# Patient Record
Sex: Female | Born: 1945 | Race: Black or African American | Hispanic: No | Marital: Married | Smoking: Never smoker
Health system: Southern US, Community
[De-identification: ages and names within clinical notes are randomized; demographics above are authoritative.]

## PROBLEM LIST (undated history)

## (undated) DIAGNOSIS — C50919 Malignant neoplasm of unspecified site of unspecified female breast: Secondary | ICD-10-CM

## (undated) DIAGNOSIS — H269 Unspecified cataract: Secondary | ICD-10-CM

## (undated) DIAGNOSIS — K219 Gastro-esophageal reflux disease without esophagitis: Secondary | ICD-10-CM

## (undated) DIAGNOSIS — M1990 Unspecified osteoarthritis, unspecified site: Secondary | ICD-10-CM

## (undated) DIAGNOSIS — E039 Hypothyroidism, unspecified: Secondary | ICD-10-CM

## (undated) HISTORY — DX: Unspecified osteoarthritis, unspecified site: M19.90

## (undated) HISTORY — DX: Unspecified cataract: H26.9

## (undated) HISTORY — DX: Malignant neoplasm of unspecified site of unspecified female breast: C50.919

## (undated) HISTORY — DX: Hypothyroidism, unspecified: E03.9

---

## 2000-10-06 HISTORY — PX: ABDOMINAL HYSTERECTOMY: SHX81

## 2001-10-06 DIAGNOSIS — M1990 Unspecified osteoarthritis, unspecified site: Secondary | ICD-10-CM

## 2001-10-06 DIAGNOSIS — K219 Gastro-esophageal reflux disease without esophagitis: Secondary | ICD-10-CM

## 2001-10-06 HISTORY — DX: Gastro-esophageal reflux disease without esophagitis: K21.9

## 2001-10-06 HISTORY — DX: Unspecified osteoarthritis, unspecified site: M19.90

## 2003-02-01 DIAGNOSIS — M069 Rheumatoid arthritis, unspecified: Secondary | ICD-10-CM | POA: Insufficient documentation

## 2006-05-26 ENCOUNTER — Ambulatory Visit: Payer: Self-pay | Admitting: Family Medicine

## 2006-05-29 ENCOUNTER — Ambulatory Visit (HOSPITAL_COMMUNITY): Admission: RE | Admit: 2006-05-29 | Discharge: 2006-05-29 | Payer: Self-pay | Admitting: Family Medicine

## 2006-06-29 ENCOUNTER — Ambulatory Visit: Payer: Self-pay | Admitting: Family Medicine

## 2006-06-29 ENCOUNTER — Other Ambulatory Visit: Admission: RE | Admit: 2006-06-29 | Discharge: 2006-06-29 | Payer: Self-pay | Admitting: Family Medicine

## 2006-07-07 ENCOUNTER — Ambulatory Visit (HOSPITAL_COMMUNITY): Admission: RE | Admit: 2006-07-07 | Discharge: 2006-07-07 | Payer: Self-pay | Admitting: Family Medicine

## 2006-07-31 ENCOUNTER — Encounter (INDEPENDENT_AMBULATORY_CARE_PROVIDER_SITE_OTHER): Payer: Self-pay | Admitting: *Deleted

## 2006-07-31 ENCOUNTER — Ambulatory Visit (HOSPITAL_COMMUNITY): Admission: RE | Admit: 2006-07-31 | Discharge: 2006-07-31 | Payer: Self-pay | Admitting: Gastroenterology

## 2006-07-31 ENCOUNTER — Ambulatory Visit: Payer: Self-pay | Admitting: Gastroenterology

## 2006-11-04 ENCOUNTER — Ambulatory Visit: Payer: Self-pay | Admitting: Family Medicine

## 2006-11-10 ENCOUNTER — Encounter: Payer: Self-pay | Admitting: Family Medicine

## 2007-04-15 ENCOUNTER — Encounter (HOSPITAL_COMMUNITY): Admission: RE | Admit: 2007-04-15 | Discharge: 2007-05-15 | Payer: Self-pay | Admitting: Endocrinology

## 2007-10-07 ENCOUNTER — Encounter: Payer: Self-pay | Admitting: Family Medicine

## 2008-04-14 ENCOUNTER — Encounter: Payer: Self-pay | Admitting: Family Medicine

## 2008-04-14 DIAGNOSIS — K219 Gastro-esophageal reflux disease without esophagitis: Secondary | ICD-10-CM | POA: Insufficient documentation

## 2008-04-14 DIAGNOSIS — M19049 Primary osteoarthritis, unspecified hand: Secondary | ICD-10-CM | POA: Insufficient documentation

## 2008-04-14 DIAGNOSIS — F329 Major depressive disorder, single episode, unspecified: Secondary | ICD-10-CM

## 2010-10-27 ENCOUNTER — Encounter: Payer: Self-pay | Admitting: Family Medicine

## 2010-11-05 NOTE — Letter (Signed)
Summary: rpc chart  rpc chart   Imported By: Curtis Sites 05/06/2010 14:42:51  _____________________________________________________________________  External Attachment:    Type:   Image     Comment:   External Document

## 2011-02-21 NOTE — Op Note (Signed)
NAMELEVINA, BOYACK                ACCOUNT NO.:  [id number]   MEDICAL RECORD NO.:  [id number]          PATIENT TYPE:  AMB   LOCATION:  DAY                           FACILITY:  [facility]   PHYSICIAN:  Kassie Mends, M.D.      DATE OF BIRTH:  1946/08/06   DATE OF PROCEDURE:  07/31/2006  DATE OF DISCHARGE:  07/31/2006                                 OPERATIVE REPORT   REFERRING PHYSICIAN:  Dr. Lodema Hong.   PROCEDURE:  Colonoscopy with cold forceps polypectomy.   INDICATION FOR EXAM:  Ms. Debord is a 65 year old female who presents for  average risk colon cancer screening.   FINDINGS:  1. 3 mm a descending colon polyp removed via cold forceps.  2. Otherwise no diverticula, masses, inflammatory changes or vascular      ectasia seen.  3. Normal retroflexed view of the rectum.   RECOMMENDATIONS:  1. I will call her with biopsy results.  If polyp is adenomatous next      screening colonoscopy should be in 5 years.  If the polyp is      adenomatous then her siblings and children should have their first      screening colonoscopy at age 81 and then every 10 years.  2. Follow up with Dr. Syliva Overman.  3. She is given handout on diverticulosis high-fiber diet and polyps.  4. No aspirin or anti-inflammatory drugs for 3 days.   MEDICATIONS:  1. Demerol 75 mg IV.  2. Versed 6 mg IV.   PROCEDURE TECHNIQUE:  Physical exam was performed.  Informed consent was  obtained from the patient after explaining the benefits, risks and  alternatives to the procedure.  The patient was connected to the monitor and  placed in the left lateral position.  Continuous oxygen was provided by  nasal cannula and IV medicine administered through an indwelling cannula.  After administration of sedation and rectal exam, the patient's rectum was  intubated.  The scope was advanced under direct visualization to the cecum.  The scope  was withdrawn and slowly by carefully examining the color, texture, anatomy  and  integrity of mucosa on the way out.  The patient was recovered in  endoscopy suite and discharged home in satisfactory condition.      Kassie Mends, M.D.  Electronically Signed     SM/MEDQ  D:  08/01/2006  T:  08/03/2006  Job:  [id number]   cc:   Milus Mallick. Lodema Hong, M.D.  Fax: [phone]

## 2011-07-01 ENCOUNTER — Encounter: Payer: Self-pay | Admitting: Gastroenterology

## 2012-10-27 DIAGNOSIS — M8580 Other specified disorders of bone density and structure, unspecified site: Secondary | ICD-10-CM | POA: Insufficient documentation

## 2014-10-06 DIAGNOSIS — H269 Unspecified cataract: Secondary | ICD-10-CM

## 2014-10-06 HISTORY — DX: Unspecified cataract: H26.9

## 2016-10-21 DIAGNOSIS — N950 Postmenopausal bleeding: Secondary | ICD-10-CM | POA: Diagnosis not present

## 2016-11-11 DIAGNOSIS — N950 Postmenopausal bleeding: Secondary | ICD-10-CM | POA: Diagnosis not present

## 2016-11-19 DIAGNOSIS — R102 Pelvic and perineal pain: Secondary | ICD-10-CM | POA: Diagnosis not present

## 2017-01-13 DIAGNOSIS — N950 Postmenopausal bleeding: Secondary | ICD-10-CM | POA: Diagnosis not present

## 2017-01-13 DIAGNOSIS — N952 Postmenopausal atrophic vaginitis: Secondary | ICD-10-CM | POA: Diagnosis not present

## 2017-02-18 DIAGNOSIS — R609 Edema, unspecified: Secondary | ICD-10-CM | POA: Diagnosis not present

## 2017-02-18 DIAGNOSIS — W57XXXA Bitten or stung by nonvenomous insect and other nonvenomous arthropods, initial encounter: Secondary | ICD-10-CM | POA: Diagnosis not present

## 2017-03-18 DIAGNOSIS — Z86010 Personal history of colonic polyps: Secondary | ICD-10-CM | POA: Diagnosis not present

## 2017-04-13 DIAGNOSIS — Z1211 Encounter for screening for malignant neoplasm of colon: Secondary | ICD-10-CM | POA: Diagnosis not present

## 2017-04-13 DIAGNOSIS — Z880 Allergy status to penicillin: Secondary | ICD-10-CM | POA: Diagnosis not present

## 2017-04-13 DIAGNOSIS — Z86010 Personal history of colonic polyps: Secondary | ICD-10-CM | POA: Diagnosis not present

## 2017-07-20 DIAGNOSIS — Z23 Encounter for immunization: Secondary | ICD-10-CM | POA: Diagnosis not present

## 2017-07-20 DIAGNOSIS — M1711 Unilateral primary osteoarthritis, right knee: Secondary | ICD-10-CM | POA: Diagnosis not present

## 2017-07-20 DIAGNOSIS — R600 Localized edema: Secondary | ICD-10-CM | POA: Diagnosis not present

## 2017-07-30 DIAGNOSIS — M25561 Pain in right knee: Secondary | ICD-10-CM | POA: Diagnosis not present

## 2017-07-30 DIAGNOSIS — M25562 Pain in left knee: Secondary | ICD-10-CM | POA: Diagnosis not present

## 2017-07-30 DIAGNOSIS — M170 Bilateral primary osteoarthritis of knee: Secondary | ICD-10-CM | POA: Diagnosis not present

## 2017-08-26 DIAGNOSIS — M25561 Pain in right knee: Secondary | ICD-10-CM | POA: Diagnosis not present

## 2017-08-26 DIAGNOSIS — M25562 Pain in left knee: Secondary | ICD-10-CM | POA: Diagnosis not present

## 2017-09-08 DIAGNOSIS — R600 Localized edema: Secondary | ICD-10-CM | POA: Diagnosis not present

## 2017-09-24 DIAGNOSIS — R7309 Other abnormal glucose: Secondary | ICD-10-CM | POA: Diagnosis not present

## 2017-11-19 DIAGNOSIS — M25511 Pain in right shoulder: Secondary | ICD-10-CM | POA: Diagnosis not present

## 2017-11-19 DIAGNOSIS — R600 Localized edema: Secondary | ICD-10-CM | POA: Diagnosis not present

## 2017-11-19 DIAGNOSIS — R067 Sneezing: Secondary | ICD-10-CM | POA: Diagnosis not present

## 2017-11-19 DIAGNOSIS — R252 Cramp and spasm: Secondary | ICD-10-CM | POA: Diagnosis not present

## 2017-11-19 DIAGNOSIS — N63 Unspecified lump in unspecified breast: Secondary | ICD-10-CM | POA: Diagnosis not present

## 2017-11-25 DIAGNOSIS — C50919 Malignant neoplasm of unspecified site of unspecified female breast: Secondary | ICD-10-CM

## 2017-11-25 DIAGNOSIS — N6001 Solitary cyst of right breast: Secondary | ICD-10-CM | POA: Diagnosis not present

## 2017-11-25 DIAGNOSIS — R590 Localized enlarged lymph nodes: Secondary | ICD-10-CM | POA: Diagnosis not present

## 2017-11-25 DIAGNOSIS — R928 Other abnormal and inconclusive findings on diagnostic imaging of breast: Secondary | ICD-10-CM | POA: Diagnosis not present

## 2017-11-25 DIAGNOSIS — N6311 Unspecified lump in the right breast, upper outer quadrant: Secondary | ICD-10-CM | POA: Diagnosis not present

## 2017-11-25 DIAGNOSIS — C50911 Malignant neoplasm of unspecified site of right female breast: Secondary | ICD-10-CM | POA: Diagnosis not present

## 2017-11-25 DIAGNOSIS — M25511 Pain in right shoulder: Secondary | ICD-10-CM | POA: Diagnosis not present

## 2017-11-25 HISTORY — DX: Malignant neoplasm of unspecified site of unspecified female breast: C50.919

## 2017-12-01 DIAGNOSIS — Z171 Estrogen receptor negative status [ER-]: Secondary | ICD-10-CM | POA: Diagnosis not present

## 2017-12-01 DIAGNOSIS — C50411 Malignant neoplasm of upper-outer quadrant of right female breast: Secondary | ICD-10-CM | POA: Diagnosis not present

## 2017-12-04 DIAGNOSIS — Z79899 Other long term (current) drug therapy: Secondary | ICD-10-CM | POA: Diagnosis not present

## 2017-12-04 DIAGNOSIS — C50919 Malignant neoplasm of unspecified site of unspecified female breast: Secondary | ICD-10-CM | POA: Diagnosis not present

## 2017-12-04 DIAGNOSIS — Z8041 Family history of malignant neoplasm of ovary: Secondary | ICD-10-CM | POA: Diagnosis not present

## 2017-12-04 DIAGNOSIS — Z90710 Acquired absence of both cervix and uterus: Secondary | ICD-10-CM | POA: Diagnosis not present

## 2017-12-04 DIAGNOSIS — C50411 Malignant neoplasm of upper-outer quadrant of right female breast: Secondary | ICD-10-CM | POA: Diagnosis not present

## 2017-12-04 DIAGNOSIS — Z9889 Other specified postprocedural states: Secondary | ICD-10-CM | POA: Diagnosis not present

## 2017-12-10 DIAGNOSIS — C50411 Malignant neoplasm of upper-outer quadrant of right female breast: Secondary | ICD-10-CM | POA: Diagnosis not present

## 2017-12-10 DIAGNOSIS — Z9889 Other specified postprocedural states: Secondary | ICD-10-CM | POA: Diagnosis not present

## 2017-12-10 DIAGNOSIS — C50911 Malignant neoplasm of unspecified site of right female breast: Secondary | ICD-10-CM | POA: Diagnosis not present

## 2017-12-10 DIAGNOSIS — Z8041 Family history of malignant neoplasm of ovary: Secondary | ICD-10-CM | POA: Diagnosis not present

## 2017-12-10 DIAGNOSIS — C50919 Malignant neoplasm of unspecified site of unspecified female breast: Secondary | ICD-10-CM | POA: Diagnosis not present

## 2017-12-10 DIAGNOSIS — Z01810 Encounter for preprocedural cardiovascular examination: Secondary | ICD-10-CM | POA: Diagnosis not present

## 2017-12-10 DIAGNOSIS — Z90710 Acquired absence of both cervix and uterus: Secondary | ICD-10-CM | POA: Diagnosis not present

## 2017-12-10 DIAGNOSIS — Z79899 Other long term (current) drug therapy: Secondary | ICD-10-CM | POA: Diagnosis not present

## 2017-12-14 DIAGNOSIS — C50919 Malignant neoplasm of unspecified site of unspecified female breast: Secondary | ICD-10-CM | POA: Diagnosis not present

## 2017-12-16 DIAGNOSIS — C50919 Malignant neoplasm of unspecified site of unspecified female breast: Secondary | ICD-10-CM | POA: Diagnosis not present

## 2017-12-16 DIAGNOSIS — R937 Abnormal findings on diagnostic imaging of other parts of musculoskeletal system: Secondary | ICD-10-CM | POA: Diagnosis not present

## 2017-12-16 DIAGNOSIS — M549 Dorsalgia, unspecified: Secondary | ICD-10-CM | POA: Diagnosis not present

## 2017-12-17 ENCOUNTER — Other Ambulatory Visit: Payer: Self-pay | Admitting: Oncology

## 2017-12-18 ENCOUNTER — Telehealth: Payer: Self-pay | Admitting: Nurse Practitioner

## 2017-12-18 NOTE — Telephone Encounter (Signed)
Appt has been scheduled with the pt's daughter to see Wilnette Kales on 3/21 at 2pm. Aware to arrive 30 minutes early.

## 2017-12-21 ENCOUNTER — Inpatient Hospital Stay: Payer: Medicare Other | Attending: Nurse Practitioner | Admitting: Oncology

## 2017-12-21 ENCOUNTER — Encounter: Payer: Self-pay | Admitting: Oncology

## 2017-12-21 DIAGNOSIS — Z800 Family history of malignant neoplasm of digestive organs: Secondary | ICD-10-CM | POA: Diagnosis not present

## 2017-12-21 DIAGNOSIS — Z808 Family history of malignant neoplasm of other organs or systems: Secondary | ICD-10-CM | POA: Insufficient documentation

## 2017-12-21 DIAGNOSIS — C50411 Malignant neoplasm of upper-outer quadrant of right female breast: Secondary | ICD-10-CM | POA: Diagnosis not present

## 2017-12-21 DIAGNOSIS — Z8042 Family history of malignant neoplasm of prostate: Secondary | ICD-10-CM | POA: Insufficient documentation

## 2017-12-21 DIAGNOSIS — Z171 Estrogen receptor negative status [ER-]: Secondary | ICD-10-CM | POA: Insufficient documentation

## 2017-12-21 NOTE — Progress Notes (Signed)
START ON PATHWAY REGIMEN - Breast     A cycle is every 21 days (cycles 1-4):     Paclitaxel      Carboplatin    A cycle is every 14 days (cycles 5-8):     Doxorubicin      Cyclophosphamide      Pegfilgrastim-xxxx   **Always confirm dose/schedule in your pharmacy ordering system**    Patient Characteristics: Preoperative or Nonsurgical Candidate (Clinical Staging), Neoadjuvant Therapy followed by Surgery, Invasive Disease, Chemotherapy, HER2 Negative/Unknown/Equivocal, ER Negative/Unknown, Platinum Therapy Indicated Therapeutic Status: Preoperative or Nonsurgical Candidate (Clinical Staging) AJCC M Category: cM0 AJCC Grade: G3 Breast Surgical Plan: Neoadjuvant Therapy followed by Surgery ER Status: Negative (-) AJCC 8 Stage Grouping: IIIC HER2 Status: Negative (-) AJCC T Category: cT2 AJCC N Category: cN2 PR Status: Negative (-) Type of Therapy: Platinum Therapy Indicated Intent of Therapy: Curative Intent, Discussed with Patient

## 2017-12-21 NOTE — Progress Notes (Signed)
[place]  [phone] [phone]) [phone]     ID: Veronica Price DOB: 10-25-1945  MR#: [id number]  ZLD#:[id number]  Patient Care Team: [name], MD as PCP - General Graydon Fofana, [name], MD as Consulting Physician (Oncology) Obediah Welles, [name], MD as Consulting Physician (Oncology) [name], MD as Consulting Physician (General Surgery) OTHER MD:  CHIEF COMPLAINT: Triple negative breast cancer  CURRENT TREATMENT: Neoadjuvant chemotherapy pending   HISTORY OF CURRENT ILLNESS: Veronica Price initially palpated a mass to her right breast in February 2019.   She brought her to her primary care physician's attention and was set up for bilateral diagnostic mammography with tomography and right breast ultrasonography on 11/25/2017 showing: a 3.2 x 3.7 cm lobulated mass in the UOQ of the right breast; there was a 3 cm calcified retroareolar mass in the left breast. On right breast ultrasonography there was a complex mostly solid 2.4 x 3.1 cm mass at 10 o'clock and multiple enlarged right axillary lymph nodes, the largest measuring 1.6 x 2.4.  Accordingly on 11/27/2017 the patient proceeded to biopsy of the right breast area in question, as well as an attempted axillary node biopsy. The pathology from this procedure showed ([id number]):  high grade infiltrating ductal carcinoma with no lymphovascular invasion.  The attempted axillary node biopsy showed fat, no nodal tissue.  Prognostic indicators were: estrogen and progesterone receptor negative,. Proliferation marker Ki67 at >80%. HER2 negative by immunohistochemistry at 0%  CT scans of the chest abdomen and pelvis completed on 12/10/2017 showed A complex solid and cystic 3.5 cm right breast mass with several bilateral axillary lymph nodes, the largest in the right axilla measured 12 mm. There was no evidence of visceral metastatic disease.   She had a bone scan on 12/10/2017 that showed two areas of  increased uptake overlying the lateral aspects of the T11 vertebrae. Review of the CT scan of the chest revealed sclerosis of the T11 pedicles concerning for bone metastases.  However subsequent spinal MRI (I do not have that report) read this out as arthritis, not a metastatic deposit  The patient had a echocardiogram completed on 3/72019 with results showing: left ventricular ejection fraction of 55-60% with mild concentric hypertrophy. There was mild to moderate mitral and mild tricuspid regurgitation with mild pulmonary hypertension. The aortic valve was mildly thickened with trace aortic regurgitation.   The patient's subsequent history is as detailed below.  INTERVAL HISTORY: Veronica Price was evaluated in the breast clinic on 12/21/2017 accompanied by her husband, Veronica Price and her daughter Veronica Price.    REVIEW OF SYSTEMS: [name] reports that she has trouble swallowing with eating apples intermittently. The patient denies unusual headaches, visual changes, nausea, vomiting, stiff neck, dizziness, or gait imbalance. There has been no cough, phlegm production, or pleurisy, no chest pain or pressure, and no change in bowel or bladder habits. The patient denies fever, rash, bleeding, unexplained fatigue or unexplained weight loss. A detailed review of systems was otherwise entirely negative.   PAST MEDICAL HISTORY: Past Medical History:  Diagnosis Date  . Arthritis   . Breast cancer ([facility])    Right breast  . Cataract    "early" per patient    PAST SURGICAL HISTORY: Past Surgical History:  Procedure Laterality Date  . ABDOMINAL HYSTERECTOMY     Total hysterectomy in 2002    FAMILY HISTORY No family history on file. She notes that her father died from a possible MI in his early 76's Her mother is still  alive and will be 101 March 2019!  The patient has 2 brothers and 2 sisters. Her brother had prostate cancer possibly agent orange related. She has a cousin who was recently diagnosed with stomach  cancer. Her maternal grandmother died from colon cancer. She has a maternal uncle with bone cancer. She denies family hx of breast or ovarian cancer.    GYNECOLOGIC HISTORY:  Menarche: 72 years old Age at first live birth: 72 years old Veronica Price P3 LMP: at age 67 Contraceptive: OCP HRT   Hysterectomy? 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 She also worked at [employer]. She lives at home with her husband, Veronica Price who is a retired Building control surveyor, her husband works part time driving an adult day care bus. Her daughter, Veronica Price is a Multimedia programmer. Her daughter Veronica Price lives in [place], [place] and works as a Therapist, sports for the [place]. Her son Veronica Price, lives in [place], and works as a Administrator.  The patient has 3 grandchildren and no great-grandchildren. She is of baptist faith.     ADVANCED DIRECTIVES:    HEALTH MAINTENANCE: Social History   Tobacco Use  . Smoking status: Not on file  Substance Use Topics  . Alcohol use: Not on file  . Drug use: Not on file     Colonoscopy: UTD; last year (2018)  PAP:  Bone density:    Allergies  Allergen Reactions  . Prednisone Swelling    " I think it was this that made my tongue swell "    Current Outpatient Medications  Medication Sig Dispense Refill  . ALPRAZolam (XANAX) 0.5 MG tablet 1 tab 1 or 2 x a day prn nerves     No current facility-administered medications for this visit.     OBJECTIVE: Older African-American woman in no acute distress Vitals:   12/21/17 1549  BP: (!) 155/77  Pulse: 78  Resp: 18  Temp: 97.7 F (36.5 C)  SpO2: 100%     There is no height or weight on file to calculate BMI.   Wt Readings from Last 3 Encounters:  12/21/17 181 lb 9.6 oz (82.4 kg)      ECOG FS:0 - Asymptomatic  Ocular: Sclerae unicteric, pupils round and equal Ear-nose-throat: full upper plate Lymphatic: No cervical or supraclavicular  adenopathy Lungs no rales or rhonchi Heart regular rate and rhythm Abd soft, nontender, positive bowel sounds MSK kyphosis but no focal spinal tenderness, no joint edema Neuro: non-focal, well-oriented, appropriate affect Breasts: The mass in the upper outer quadrant of the right breast is movable, measures about 2 cm by palpation, and there is no overlying erythema.  I do not palpate any right axillary adenopathy.  The left breast is unremarkable.  The left axilla is unremarkable   LAB RESULTS:  CMP  No results found for: NA, K, CL, CO2, GLUCOSE, BUN, CREATININE, CALCIUM, PROT, ALBUMIN, AST, ALT, ALKPHOS, BILITOT, GFRNONAA, GFRAA  No results found for: TOTALPROTELP, ALBUMINELP, A1GS, A2GS, BETS, BETA2SER, GAMS, MSPIKE, SPEI  No results found for: KPAFRELGTCHN, LAMBDASER, KAPLAMBRATIO  No results found for: WBC, NEUTROABS, HGB, HCT, MCV, PLT  _0 @  No results found for: LABCA2  No components found for: TIRWER154  No results for input(s): INR in the last 168 hours.  No results found for: LABCA2  No results found for: MGQ676  No results found for: PPJ093  No results found for:  YWV371  No results found for: CA2729  No components found for: HGQUANT  No results found for: CEA1 / No results found for: CEA1   No results found for: AFPTUMOR  No results found for: CHROMOGRNA  No results found for: PSA1  No visits with results within 3 Day(s) from this visit.  Latest known visit with results is:  CEMR Conversion Encounter on 11/10/2006  Component Date Value Ref Range Status  . T4, Total 11/10/2006 8.4  5.0 - 12.5 mcg/dL Final  . TSH 11/10/2006 0.005* 0.350 - 5.50 microintl units/mL Final   See lab report for associated comment(s)    (this displays the last labs from the last 3 days)  No results found for: TOTALPROTELP, ALBUMINELP, A1GS, A2GS, BETS, BETA2SER, GAMS, MSPIKE, SPEI (this displays SPEP labs)  No results found for: KPAFRELGTCHN, LAMBDASER,  KAPLAMBRATIO (kappa/lambda light chains)  No results found for: HGBA, HGBA2QUANT, HGBFQUANT, HGBSQUAN (Hemoglobinopathy evaluation)   No results found for: LDH  No results found for: IRON, TIBC, IRONPCTSAT (Iron and TIBC)  No results found for: FERRITIN  Urinalysis No results found for: COLORURINE, APPEARANCEUR, LABSPEC, PHURINE, GLUCOSEU, HGBUR, BILIRUBINUR, KETONESUR, PROTEINUR, UROBILINOGEN, NITRITE, LEUKOCYTESUR   STUDIES: No results found.  ELIGIBLE FOR AVAILABLE RESEARCH PROTOCOL: Consider SWOG [phone] post-op  ASSESSMENT: 72 y.o. Veronica Price, [place] woman status post right breast upper outer quadrant biopsy 11/25/2017 for a clinical T2 N2, stage IIIc invasive ductal carcinoma, grade 3, triple negative, with an MIB-1 of 80%.  (1) staging studies:  (a) chest CT 12/10/2017 scan showed no visceral metastatic disease  (b) bone scan 12/10/2017 showed uptake at T11, with sclerosis.  (c) CA-27-29 on 12/05/2017 was 19.0  (d) spinal MRI in [place] reportedly showed only arthrtis at T11  (2) neoadjuvant chemotherapy to consist of carboplatin/ paclitaxel x 12 followed by cytoxan/ adriamycin x 4  (3) definitive surgery to follow  (4) adjuvant radiation as appropriate   PLAN: We spent the better part of today's hour-long appointment discussing the biology of her diagnosis and the specifics of her situation. We first reviewed the fact that cancer is not one disease but more than 100 different diseases and that it is important to keep them separate-- otherwise when friends and relatives discuss their own cancer experiences with Veronica Price confusion can result. Similarly we explained that if breast cancer spreads to the bone or liver, the patient would not have bone cancer or liver cancer, but breast cancer in the bone and breast cancer in the liver: one cancer in three places-- not 3 different cancers which otherwise would have to be treated in 3 different ways.  We discussed the difference between  local and systemic therapy. In terms of loco-regional treatment, lumpectomy plus radiation is equivalent to mastectomy as far as survival is concerned. For this reason, and because the cosmetic results are generally superior, we recommend breast conserving surgery.   We also noted that in terms of sequencing of treatments, whether systemic therapy or surgery is done first does not affect the ultimate outcome. In this case the patient is interested in neo-adjuvant chemotherapy to be followed by surgery. She will need right axillary node sampling and a breast MRI as well as port placement  We then discussed the rationale for systemic therapy. There is some risk that this cancer may have already spread to other parts of her body.  She has had CT scans, bone scan, and an MRI of the one suspicious area for metastatic disease, at T11, which showed only arthritis.  This means she does not have stage IV disease.  It does not mean that she does not have occult, microscopic cancer in her liver, lungs, or bones, since no scan or blood test would be able to show microscopic spread to those areas.  That is why she needs systemic treatment  Next we went over the options for systemic therapy which are anti-estrogens, anti-HER-2 immunotherapy, and chemotherapy. Veronica Price does not meet criteria for anti-HER-2 immunotherapy or anti-estrogens.  Her only choice of systemic therapy is chemotherapy.  That accordingly is what we recommend.  More specifically I would suggest starting with carboplatin and paclitaxel given weekly x12, to be followed by cyclophosphamide and doxorubicin to be given in dose dense fashion.  She would then proceed to surgery and if there were residual disease greater than 1 cm she would qualify for this walk study using pembrolizumab and triple negative patient in that situation.  The patient requested to meet with a surgeon here and we are trying to get that arranged as soon as possible.  She would like  to receive her chemotherapy treatments here, since her daughter Veronica Price lives in [place].  When it comes to radiation however she may prefer to have that done in [place] as that would be closer to her  Veronica Price has a good understanding of the overall plan. She agrees with it. She knows the goal of treatment in her case is cure. She will call with any problems that may develop before her next visit here.   Zai Chmiel, [name], MD  12/21/17 5:37 PM [facility] [address] [place], [place] [address] Tel. [phone]    Fax. [phone]    This document serves as a record of services personally performed by Lurline Del, MD. It was created on his behalf by Steva Colder, a trained medical scribe. The creation of this record is based on the scribe's personal observations and the provider's statements to them.   I have reviewed the above documentation for accuracy and completeness, and I agree with the above.

## 2017-12-22 ENCOUNTER — Telehealth: Payer: Self-pay | Admitting: Oncology

## 2017-12-22 NOTE — Telephone Encounter (Signed)
Per 3/18 no los °

## 2017-12-22 NOTE — Telephone Encounter (Signed)
12/22/17 0930 I spoke with patient to schedule her chemo education and chemotherapy appts per the 3/18 Sch IB message from Dr. Jana Hakim.  In speaking with patient, she stated she wants to go with surgery first.  Message sent to Dr. Jana Hakim - per Dr. Jana Hakim, cancel all chemo related appts and he has messaged Dr. Dalbert Batman to follow-up w/ her regarding surgical appt.  Pt requested that next time I speak with her daughter Jocelyn Lamer), and I contacted Jocelyn Lamer to inform her to expect a surgical consult for her mother (Ingram's name and office number provided if they have not been scheduled by tomorrow at noon), and also set her up with a 1 month follow-up appt w/ [name] on his first available during his requested timeframe.  All questions were answered for patient's daughter at this time.  She has the [facility] contact info if she needs further.

## 2017-12-23 ENCOUNTER — Encounter: Payer: Self-pay | Admitting: General Surgery

## 2017-12-23 ENCOUNTER — Other Ambulatory Visit: Payer: Self-pay | Admitting: General Surgery

## 2017-12-23 DIAGNOSIS — R590 Localized enlarged lymph nodes: Secondary | ICD-10-CM | POA: Diagnosis not present

## 2017-12-23 DIAGNOSIS — Z90722 Acquired absence of ovaries, bilateral: Secondary | ICD-10-CM | POA: Diagnosis not present

## 2017-12-23 DIAGNOSIS — Z9079 Acquired absence of other genital organ(s): Secondary | ICD-10-CM | POA: Diagnosis not present

## 2017-12-23 DIAGNOSIS — C50411 Malignant neoplasm of upper-outer quadrant of right female breast: Secondary | ICD-10-CM | POA: Diagnosis not present

## 2017-12-23 DIAGNOSIS — Z90710 Acquired absence of both cervix and uterus: Secondary | ICD-10-CM | POA: Diagnosis not present

## 2017-12-24 ENCOUNTER — Ambulatory Visit: Payer: Self-pay | Admitting: Nurse Practitioner

## 2017-12-26 ENCOUNTER — Ambulatory Visit
Admission: RE | Admit: 2017-12-26 | Discharge: 2017-12-26 | Disposition: A | Discharge status: Home / Self Care | Payer: Medicare Other | Source: Ambulatory Visit | Attending: Oncology | Admitting: Oncology

## 2017-12-26 DIAGNOSIS — Z171 Estrogen receptor negative status [ER-]: Principal | ICD-10-CM

## 2017-12-26 DIAGNOSIS — C50411 Malignant neoplasm of upper-outer quadrant of right female breast: Secondary | ICD-10-CM

## 2017-12-26 DIAGNOSIS — C50911 Malignant neoplasm of unspecified site of right female breast: Secondary | ICD-10-CM | POA: Diagnosis not present

## 2017-12-26 MED ORDER — GADOBENATE DIMEGLUMINE 529 MG/ML IV SOLN
15.0000 mL | Freq: Once | INTRAVENOUS | Status: AC | PRN
  Administered 2017-12-26: 15 mL via INTRAVENOUS

## 2017-12-27 NOTE — H&P (Signed)
[name] Location: [facility] Patient #: [id number] DOB: January 16, 1946 Married / Language: English / Race: Black or African American Female        History of Present Illness       The patient is a 72 year old female who presents with breast cancer. This is a 72 year old female from [place], [place]. She is referred by Dr. 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 Her husband and daughter are with her today      She has no prior breast problems. She has felt a lump in her lateral right breast for about a month. Mammograms in [place] show a 3.7 cm lobulated noncalcified mass in the upper outer quadrant of the right breast. She also has a densely calcified retroareolar mass on the left that is been there for years and is benign. By ultrasound this mass appears to be 3.1 cm at 10:00. Multiple enlarged lymph nodes are noted in the right axillary area the largest measuring or centimeters. I have reviewed that mammogram report Image guided biopsy of the right breast reveals grade 3 invasive ductal carcinoma, triple negative breast cancer, Ki-67 greater than 80%. Image guided biopsy of the right axillary lymph node revealed only fatty tissue. This appears to be discordant.       Staging workup includes CT scan of the chest abdomen and pelvis which shows the right breast mass in the right axillary adenopathy. There were sclerotic changes and T11 but that was read as benign arthritis. There is no visceral metastasis. Echocardiogram shows ejection fraction 55-60%. There may be some minor valvular disorder Bone scan shows uptake in T11 Breast MRI is ordered and pending       Past history is mostly negative. 3 pregnancies and 3 deliveries. TAH and BSO. She takes no prescription medications. Family history is negative for breast or ovarian cancer. A maternal grandmother may have had colon cancer the patient's mother lives  next door and is 48 years old Social history reveals that she is married and lives in [place] with her husband. She has 1 son who is a Administrator. One daughter lives in [place] and is an Therapist, sports with [employer]. A second daughter is an Therapist, sports and works at the [employer] in [place]. Denies alcohol or tobacco       She has seen Dr. Jana Hakim who wants to give her neoadjuvant chemotherapy to be followed by breast surgery. I think that is the most reasonable approach considering the risk of systemic disease. We also stand a good chance of downstaging of the right breast cancer and being able to do breast conservation surgery. After lengthy discussion the patient states that she would like to keep her breast if possible and I think this is the best way to give that a chance. We also talked about upfront surgery and I told her that we'll certainly possible but not my preference. We do need to know whether she has axillary metastasis. I have asked my office staff to expedite the breast MRI and to schedule MRI guided biopsy of a right axillary lymph nodes We are going ahead with scheduling of the Port-A-Cath urgently I discussed the indications, details, techniques, and numerous risk Port-A-Cath insertion. She and her family are aware of the risk of bleeding, infection, failure to insert, bilateral attempts, need for revision, pneumothorax, cardiac arrhythmia. She understands these issues well. All of her questions are answered. She agrees with this  plan    Past Surgical History  Breast Biopsy  Right. Colon Polyp Removal - Colonoscopy  Hysterectomy (not due to cancer) - Complete   Diagnostic Studies History  Mammogram  within last year Pap Smear  1-5 years ago  Allergies  No Known Drug Allergies : Allergies Reconciled   Medication History  No Current Medications Medications Reconciled  Social History  Caffeine use  Carbonated beverages, Coffee, Tea. No alcohol  use  No drug use  Tobacco use  Never smoker.  Family History  Alcohol Abuse  Father. Arthritis  Father. Diabetes Mellitus  Father. Heart Disease  Father. Hypertension  Father. Migraine Headache  Daughter.  Pregnancy / Birth History Age at menarche  5 years. Age of menopause  >59 Contraceptive History  Oral contraceptives. Gravida  3 Maternal age  64-25 Para  3 Regular periods   Other Problems  Anxiety Disorder  Arthritis  Breast Cancer  Gastroesophageal Reflux Disease  Lump In Breast     Review of Systems General Present- Fatigue and Weight Gain. Not Present- Appetite Loss, Chills, Fever, Night Sweats and Weight Loss. Skin Present- Dryness. Not Present- Change in Wart/Mole, Hives, Jaundice, New Lesions, Non-Healing Wounds, Rash and Ulcer. HEENT Present- Wears glasses/contact lenses. Not Present- Earache, Hearing Loss, Hoarseness, Nose Bleed, Oral Ulcers, Ringing in the Ears, Seasonal Allergies, Sinus Pain, Sore Throat, Visual Disturbances and Yellow Eyes. Breast Present- Breast Mass and Breast Pain. Not Present- Nipple Discharge and Skin Changes. Cardiovascular Present- Leg Cramps. Not Present- Chest Pain, Difficulty Breathing Lying Down, Palpitations, Rapid Heart Rate, Shortness of Breath and Swelling of Extremities. Gastrointestinal Not Present- Abdominal Pain, Bloating, Bloody Stool, Change in Bowel Habits, Chronic diarrhea, Constipation, Difficulty Swallowing, Excessive gas, Gets full quickly at meals, Hemorrhoids, Indigestion, Nausea, Rectal Pain and Vomiting. Female Genitourinary Not Present- Frequency, Nocturia, Painful Urination, Pelvic Pain and Urgency. Musculoskeletal Present- Joint Pain and Joint Stiffness. Not Present- Back Pain, Muscle Pain, Muscle Weakness and Swelling of Extremities. Neurological Present- Headaches and Numbness. Not Present- Decreased Memory, Fainting, Seizures, Tingling, Tremor, Trouble walking and Weakness. Psychiatric  Present- Anxiety and Change in Sleep Pattern. Not Present- Bipolar, Depression, Fearful and Frequent crying. Endocrine Present- Excessive Hunger and Hair Changes. Not Present- Cold Intolerance, Heat Intolerance, Hot flashes and New Diabetes. Hematology Present- Easy Bruising. Not Present- Blood Thinners, Excessive bleeding, Gland problems, HIV and Persistent Infections.  Vitals  Weight: 179.6 lb Height: 62in Body Surface Area: 1.83 m Body Mass Index: 32.85 kg/m  Temp.: 98.49F  Pulse: 71 (Regular)  BP: 134/86 (Sitting, Left Arm, Standard)    Physical Exam  General Mental Status-Alert. General Appearance-Consistent with stated age. Hydration-Well hydrated. Voice-Normal.  Head and Neck Head-normocephalic, atraumatic with no lesions or palpable masses. Trachea-midline. Thyroid Gland Characteristics - normal size and consistency.  Eye Eyeball - Bilateral-Extraocular movements intact. Sclera/Conjunctiva - Bilateral-No scleral icterus.  Chest and Lung Exam Chest and lung exam reveals -quiet, even and easy respiratory effort with no use of accessory muscles and on auscultation, normal breath sounds, no adventitious sounds and normal vocal resonance. Inspection Chest Wall - Normal. Back - normal.  Breast Note: Right breast reveals a 4 cm mobile mass laterally at about the 9:30 position. Doesn't seem tender. Skin not involved. Not fixed to the chest wall. No other mass in either breast. I can palpate some small lymph nodes in the right axilla. Left axilla is negative. No cervical adenopathy.   Cardiovascular Cardiovascular examination reveals -normal heart sounds, regular rate and rhythm with no murmurs and normal  pedal pulses bilaterally.  Abdomen Inspection Inspection of the abdomen reveals - No Hernias. Skin - Scar - Note: Hysterectomy incision well-healed. Palpation/Percussion Palpation and Percussion of the abdomen reveal - Soft, Non Tender,  No Rebound tenderness, No Rigidity (guarding) and No hepatosplenomegaly. Auscultation Auscultation of the abdomen reveals - Bowel sounds normal.  Neurologic Neurologic evaluation reveals -alert and oriented x 3 with no impairment of recent or remote memory. Mental Status-Normal.  Musculoskeletal Normal Exam - Left-Upper Extremity Strength Normal and Lower Extremity Strength Normal. Normal Exam - Right-Upper Extremity Strength Normal and Lower Extremity Strength Normal.  Lymphatic Head & Neck  General Head & Neck Lymphatics: Bilateral - Description - Normal. Axillary  General Axillary Region: Bilateral - Description - Normal. Tenderness - Non Tender. Femoral & Inguinal  Generalized Femoral & Inguinal Lymphatics: Bilateral - Description - Normal. Tenderness - Non Tender.    Assessment & Plan  PRIMARY CANCER OF UPPER OUTER QUADRANT OF RIGHT FEMALE BREAST (C50.411).   Your recent mammograms and ultrasound show a 3-4 cm cancer in the right breast laterally. You also have multiple enlarged right axillary lymph nodes  Biopsy of the right breast cancer shows an aggressive form of cancer called a triple negative breast cancer Biopsy of the right axillary lymph nodes just revealed fatty tissue, but we are still suspicious that the cancer has spread to the lymph nodes. Further biopsies will be done  I have talked about surgical options including lumpectomy, mastectomy, sentinel lymph node biopsy, complete axillary lymph node dissection with you and your daughter We talked about performing surgery before or after chemotherapy We have decided to proceed with Port-A-Cath insertion to shrink the tumor and to treat your entire body, We will perform the breast and axillary surgery later. I think this is the best approach considering the type of cancer that you have   you will be scheduled for Port-A-Cath insertion I have discussed the indications, techniques, and risk of the  surgery in detail  you will be scheduled for bilateral breast MRI you will also be scheduled for image guided biopsy of right axillary lymph nodes  Pt Education - CCS Portacath HCI AXILLARY ADENOPATHY (R59.0) HISTORY OF TOTAL ABDOMINAL HYSTERECTOMY AND BILATERAL SALPINGO-OOPHORECTOMY (Z90.710)    Veronica Price. Veronica Price, M.D., [facility] General and Minimally invasive Surgery Breast and Colorectal Surgery Office:   [phone] Pager:   [phone]

## 2017-12-28 ENCOUNTER — Encounter (HOSPITAL_COMMUNITY): Payer: Self-pay | Admitting: *Deleted

## 2017-12-28 ENCOUNTER — Other Ambulatory Visit: Payer: Self-pay

## 2017-12-29 ENCOUNTER — Other Ambulatory Visit: Payer: Self-pay | Admitting: Oncology

## 2017-12-29 ENCOUNTER — Ambulatory Visit (HOSPITAL_COMMUNITY): Payer: Medicare Other

## 2017-12-29 ENCOUNTER — Ambulatory Visit (HOSPITAL_COMMUNITY)
Admission: RE | Admit: 2017-12-29 | Discharge: 2017-12-29 | Disposition: A | Discharge status: Home / Self Care | Payer: Medicare Other | Source: Ambulatory Visit | Attending: General Surgery | Admitting: General Surgery

## 2017-12-29 ENCOUNTER — Other Ambulatory Visit: Payer: Self-pay | Admitting: *Deleted

## 2017-12-29 ENCOUNTER — Inpatient Hospital Stay (HOSPITAL_COMMUNITY)
Admission: RE | Admit: 2017-12-29 | Payer: Medicare Other | Source: Intra-hospital | Admitting: Physical Medicine & Rehabilitation

## 2017-12-29 ENCOUNTER — Other Ambulatory Visit: Payer: Self-pay | Admitting: General Surgery

## 2017-12-29 ENCOUNTER — Ambulatory Visit (HOSPITAL_COMMUNITY): Payer: Medicare Other | Admitting: Certified Registered Nurse Anesthetist

## 2017-12-29 ENCOUNTER — Encounter (HOSPITAL_COMMUNITY): Payer: Self-pay | Admitting: *Deleted

## 2017-12-29 ENCOUNTER — Telehealth: Payer: Self-pay | Admitting: *Deleted

## 2017-12-29 ENCOUNTER — Encounter (HOSPITAL_COMMUNITY)
Admission: RE | Disposition: A | Discharge status: Home / Self Care | Payer: Self-pay | Source: Ambulatory Visit | Attending: General Surgery

## 2017-12-29 DIAGNOSIS — M19041 Primary osteoarthritis, right hand: Secondary | ICD-10-CM | POA: Diagnosis not present

## 2017-12-29 DIAGNOSIS — C50911 Malignant neoplasm of unspecified site of right female breast: Secondary | ICD-10-CM

## 2017-12-29 DIAGNOSIS — R599 Enlarged lymph nodes, unspecified: Secondary | ICD-10-CM

## 2017-12-29 DIAGNOSIS — M1990 Unspecified osteoarthritis, unspecified site: Secondary | ICD-10-CM | POA: Insufficient documentation

## 2017-12-29 DIAGNOSIS — Z171 Estrogen receptor negative status [ER-]: Secondary | ICD-10-CM | POA: Insufficient documentation

## 2017-12-29 DIAGNOSIS — Z95828 Presence of other vascular implants and grafts: Secondary | ICD-10-CM

## 2017-12-29 DIAGNOSIS — Z86010 Personal history of colonic polyps: Secondary | ICD-10-CM | POA: Diagnosis not present

## 2017-12-29 DIAGNOSIS — M19042 Primary osteoarthritis, left hand: Secondary | ICD-10-CM | POA: Diagnosis not present

## 2017-12-29 DIAGNOSIS — C50411 Malignant neoplasm of upper-outer quadrant of right female breast: Secondary | ICD-10-CM

## 2017-12-29 DIAGNOSIS — Z452 Encounter for adjustment and management of vascular access device: Secondary | ICD-10-CM | POA: Diagnosis not present

## 2017-12-29 DIAGNOSIS — Z4682 Encounter for fitting and adjustment of non-vascular catheter: Secondary | ICD-10-CM | POA: Diagnosis not present

## 2017-12-29 HISTORY — PX: PORTACATH PLACEMENT: SHX2246

## 2017-12-29 HISTORY — DX: Gastro-esophageal reflux disease without esophagitis: K21.9

## 2017-12-29 LAB — CBC
HCT: 40.5 % (ref 36.0–46.0)
Hemoglobin: 13 g/dL (ref 12.0–15.0)
MCH: 31.6 pg (ref 26.0–34.0)
MCHC: 32.1 g/dL (ref 30.0–36.0)
MCV: 98.3 fL (ref 78.0–100.0)
PLATELETS: 228 10*3/uL (ref 150–400)
RBC: 4.12 MIL/uL (ref 3.87–5.11)
RDW: 13.7 % (ref 11.5–15.5)
WBC: 4.4 10*3/uL (ref 4.0–10.5)

## 2017-12-29 LAB — BASIC METABOLIC PANEL
Anion gap: 12 (ref 5–15)
BUN: 11 mg/dL (ref 6–20)
CALCIUM: 9.1 mg/dL (ref 8.9–10.3)
CO2: 24 mmol/L (ref 22–32)
Chloride: 103 mmol/L (ref 101–111)
Creatinine, Ser: 0.74 mg/dL (ref 0.44–1.00)
Glucose, Bld: 102 mg/dL — ABNORMAL HIGH (ref 65–99)
POTASSIUM: 3.9 mmol/L (ref 3.5–5.1)
SODIUM: 139 mmol/L (ref 135–145)

## 2017-12-29 SURGERY — INSERTION, TUNNELED CENTRAL VENOUS DEVICE, WITH PORT
Anesthesia: General | Site: Chest

## 2017-12-29 MED ORDER — CEFAZOLIN SODIUM-DEXTROSE 2-4 GM/100ML-% IV SOLN
2.0000 g | INTRAVENOUS | Status: AC
  Administered 2017-12-29: 2 g via INTRAVENOUS
  Filled 2017-12-29: qty 100

## 2017-12-29 MED ORDER — OXYCODONE HCL 5 MG/5ML PO SOLN: 5.0000 mg | Freq: Once | ORAL | Status: DC | PRN

## 2017-12-29 MED ORDER — SODIUM CHLORIDE 0.9 % IV SOLN
INTRAVENOUS | Status: AC
  Filled 2017-12-29: qty 1.2

## 2017-12-29 MED ORDER — HYDROCODONE-ACETAMINOPHEN 5-325 MG PO TABS: 1.0000 | ORAL_TABLET | Freq: Four times a day (QID) | ORAL | 0 refills | Status: DC | PRN

## 2017-12-29 MED ORDER — PROPOFOL 10 MG/ML IV BOLUS
INTRAVENOUS | Status: DC | PRN
  Administered 2017-12-29: 100 mg via INTRAVENOUS

## 2017-12-29 MED ORDER — CHLORHEXIDINE GLUCONATE CLOTH 2 % EX PADS: 6.0000 | MEDICATED_PAD | Freq: Once | CUTANEOUS | Status: DC

## 2017-12-29 MED ORDER — BUPIVACAINE-EPINEPHRINE (PF) 0.25% -1:200000 IJ SOLN
INTRAMUSCULAR | Status: AC
  Filled 2017-12-29: qty 30

## 2017-12-29 MED ORDER — FENTANYL CITRATE (PF) 100 MCG/2ML IJ SOLN: 25.0000 ug | INTRAMUSCULAR | Status: DC | PRN

## 2017-12-29 MED ORDER — LIDOCAINE 2% (20 MG/ML) 5 ML SYRINGE
INTRAMUSCULAR | Status: DC | PRN
  Administered 2017-12-29: 100 mg via INTRAVENOUS

## 2017-12-29 MED ORDER — HEPARIN SOD (PORK) LOCK FLUSH 100 UNIT/ML IV SOLN
INTRAVENOUS | Status: DC | PRN
  Administered 2017-12-29: 500 [IU] via INTRAVENOUS

## 2017-12-29 MED ORDER — ONDANSETRON HCL 4 MG/2ML IJ SOLN
INTRAMUSCULAR | Status: DC | PRN
  Administered 2017-12-29: 4 mg via INTRAVENOUS

## 2017-12-29 MED ORDER — MIDAZOLAM HCL 2 MG/2ML IJ SOLN
INTRAMUSCULAR | Status: DC | PRN
  Administered 2017-12-29: 2 mg via INTRAVENOUS

## 2017-12-29 MED ORDER — ACETAMINOPHEN 160 MG/5ML PO SOLN: 325.0000 mg | ORAL | Status: DC | PRN

## 2017-12-29 MED ORDER — LACTATED RINGERS IV SOLN
INTRAVENOUS | Status: DC
  Administered 2017-12-29 (×2): via INTRAVENOUS

## 2017-12-29 MED ORDER — SODIUM CHLORIDE 0.9 % IV SOLN
INTRAVENOUS | Status: DC | PRN
  Administered 2017-12-29: 500 mL

## 2017-12-29 MED ORDER — 0.9 % SODIUM CHLORIDE (POUR BTL) OPTIME
TOPICAL | Status: DC | PRN
  Administered 2017-12-29: 1000 mL

## 2017-12-29 MED ORDER — BUPIVACAINE-EPINEPHRINE 0.25% -1:200000 IJ SOLN
INTRAMUSCULAR | Status: DC | PRN
  Administered 2017-12-29: 4 mL

## 2017-12-29 MED ORDER — ACETAMINOPHEN 325 MG PO TABS: 325.0000 mg | ORAL_TABLET | ORAL | Status: DC | PRN

## 2017-12-29 MED ORDER — CELECOXIB 100 MG PO CAPS
100.0000 mg | ORAL_CAPSULE | ORAL | Status: AC
  Administered 2017-12-29: 100 mg via ORAL
  Filled 2017-12-29: qty 1

## 2017-12-29 MED ORDER — FENTANYL CITRATE (PF) 250 MCG/5ML IJ SOLN
INTRAMUSCULAR | Status: DC | PRN
  Administered 2017-12-29 (×2): 50 ug via INTRAVENOUS

## 2017-12-29 MED ORDER — PROPOFOL 10 MG/ML IV BOLUS
INTRAVENOUS | Status: AC
  Filled 2017-12-29: qty 20

## 2017-12-29 MED ORDER — HEPARIN SOD (PORK) LOCK FLUSH 100 UNIT/ML IV SOLN
INTRAVENOUS | Status: AC
  Filled 2017-12-29: qty 5

## 2017-12-29 MED ORDER — PHENYLEPHRINE HCL 10 MG/ML IJ SOLN
INTRAVENOUS | Status: DC | PRN
  Administered 2017-12-29: 25 ug/min via INTRAVENOUS

## 2017-12-29 MED ORDER — MIDAZOLAM HCL 2 MG/2ML IJ SOLN
INTRAMUSCULAR | Status: AC
  Filled 2017-12-29: qty 2

## 2017-12-29 MED ORDER — FENTANYL CITRATE (PF) 250 MCG/5ML IJ SOLN
INTRAMUSCULAR | Status: AC
  Filled 2017-12-29: qty 5

## 2017-12-29 MED ORDER — ACETAMINOPHEN 500 MG PO TABS
1000.0000 mg | ORAL_TABLET | ORAL | Status: AC
  Administered 2017-12-29: 1000 mg via ORAL
  Filled 2017-12-29: qty 2

## 2017-12-29 MED ORDER — IOPAMIDOL (ISOVUE-300) INJECTION 61%
INTRAVENOUS | Status: AC
  Filled 2017-12-29: qty 50

## 2017-12-29 MED ORDER — GABAPENTIN 300 MG PO CAPS
300.0000 mg | ORAL_CAPSULE | ORAL | Status: AC
  Administered 2017-12-29: 300 mg via ORAL
  Filled 2017-12-29: qty 1

## 2017-12-29 MED ORDER — OXYCODONE HCL 5 MG PO TABS: 5.0000 mg | ORAL_TABLET | Freq: Once | ORAL | Status: DC | PRN

## 2017-12-29 MED ORDER — EPHEDRINE SULFATE 50 MG/ML IJ SOLN
INTRAMUSCULAR | Status: DC | PRN
  Administered 2017-12-29: 10 mg via INTRAVENOUS

## 2017-12-29 MED ORDER — LIDOCAINE HCL (PF) 1 % IJ SOLN
INTRAMUSCULAR | Status: AC
  Filled 2017-12-29: qty 30

## 2017-12-29 SURGICAL SUPPLY — 50 items
BAG DECANTER FOR FLEXI CONT (MISCELLANEOUS) ×3 IMPLANT
BLADE CLIPPER SURG (BLADE) IMPLANT
BLADE SURG 11 STRL SS (BLADE) ×3 IMPLANT
BLADE SURG 15 STRL LF DISP TIS (BLADE) IMPLANT
BLADE SURG 15 STRL SS (BLADE)
CANISTER SUCT 3000ML PPV (MISCELLANEOUS) ×3 IMPLANT
CHLORAPREP W/TINT 26ML (MISCELLANEOUS) ×3 IMPLANT
COVER SURGICAL LIGHT HANDLE (MISCELLANEOUS) ×3 IMPLANT
COVER TRANSDUCER ULTRASND GEL (DRAPE) IMPLANT
CRADLE DONUT ADULT HEAD (MISCELLANEOUS) ×3 IMPLANT
DERMABOND ADVANCED (GAUZE/BANDAGES/DRESSINGS) ×2
DERMABOND ADVANCED .7 DNX12 (GAUZE/BANDAGES/DRESSINGS) ×1 IMPLANT
DRAPE C-ARM 42X72 X-RAY (DRAPES) ×3 IMPLANT
DRAPE LAPAROSCOPIC ABDOMINAL (DRAPES) ×3 IMPLANT
DRAPE UTILITY XL STRL (DRAPES) IMPLANT
ELECT CAUTERY BLADE 6.4 (BLADE) ×3 IMPLANT
ELECT REM PT RETURN 9FT ADLT (ELECTROSURGICAL) ×3
ELECTRODE REM PT RTRN 9FT ADLT (ELECTROSURGICAL) ×1 IMPLANT
GAUZE SPONGE 4X4 16PLY XRAY LF (GAUZE/BANDAGES/DRESSINGS) ×3 IMPLANT
GLOVE EUDERMIC 7 POWDERFREE (GLOVE) ×3 IMPLANT
GOWN STRL REUS W/ TWL LRG LVL3 (GOWN DISPOSABLE) ×2 IMPLANT
GOWN STRL REUS W/ TWL XL LVL3 (GOWN DISPOSABLE) ×1 IMPLANT
GOWN STRL REUS W/TWL LRG LVL3 (GOWN DISPOSABLE) ×4
GOWN STRL REUS W/TWL XL LVL3 (GOWN DISPOSABLE) ×2
INTRODUCER 13FR (INTRODUCER) IMPLANT
INTRODUCER COOK 11FR (CATHETERS) IMPLANT
KIT BASIN OR (CUSTOM PROCEDURE TRAY) ×3 IMPLANT
KIT PORT POWER 8FR ISP CVUE (Port) ×3 IMPLANT
KIT ROOM TURNOVER OR (KITS) ×3 IMPLANT
NEEDLE HYPO 25GX1X1/2 BEV (NEEDLE) ×6 IMPLANT
NS IRRIG 1000ML POUR BTL (IV SOLUTION) ×3 IMPLANT
PACK GENERAL/GYN (CUSTOM PROCEDURE TRAY) ×3 IMPLANT
PACK SURGICAL SETUP 50X90 (CUSTOM PROCEDURE TRAY) IMPLANT
PAD ARMBOARD 7.5X6 YLW CONV (MISCELLANEOUS) ×3 IMPLANT
PENCIL BUTTON HOLSTER BLD 10FT (ELECTRODE) IMPLANT
SET INTRODUCER 12FR PACEMAKER (INTRODUCER) IMPLANT
SET SHEATH INTRODUCER 10FR (MISCELLANEOUS) IMPLANT
SHEATH COOK PEEL AWAY SET 9F (SHEATH) IMPLANT
SURGILUBE 3G PEEL PACK STRL (MISCELLANEOUS) IMPLANT
SUT MNCRL AB 4-0 PS2 18 (SUTURE) ×3 IMPLANT
SUT PROLENE 2 0 CT2 30 (SUTURE) ×6 IMPLANT
SUT VIC AB 3-0 SH 18 (SUTURE) ×3 IMPLANT
SYR 10ML LL (SYRINGE) ×6 IMPLANT
SYR 5ML LUER SLIP (SYRINGE) ×3 IMPLANT
SYR CONTROL 10ML LL (SYRINGE) ×3 IMPLANT
TOWEL OR 17X24 6PK STRL BLUE (TOWEL DISPOSABLE) ×3 IMPLANT
TOWEL OR 17X26 10 PK STRL BLUE (TOWEL DISPOSABLE) IMPLANT
TUBE CONNECTING 12'X1/4 (SUCTIONS)
TUBE CONNECTING 12X1/4 (SUCTIONS) IMPLANT
YANKAUER SUCT BULB TIP NO VENT (SUCTIONS) IMPLANT

## 2017-12-29 NOTE — Telephone Encounter (Signed)
Opal Sidles from [place] called to give update info from Dr. Rosana Hoes ( MD just read the film ) re: There were no clip films available at time of reading.  Pt may need the clip films prior to additional decision for treatment. Results printed and gave to Dr. Jana Hakim for review.

## 2017-12-29 NOTE — Interval H&P Note (Signed)
History and Physical Interval Note:  12/29/2017 6:41 AM  Veronica Price  has presented today for surgery, with the diagnosis of RIGHT BREAST CANCER  The various methods of treatment have been discussed with the patient and family. After consideration of risks, benefits and other options for treatment, the patient has consented to  Procedure(s): INSERTION PORT-A-CATH WITH ULTRA SOUND (N/A) as a surgical intervention .  The patient's history has been reviewed, patient examined, no change in status, stable for surgery.  I have reviewed the patient's chart and labs.  Questions were answered to the patient's satisfaction.     Adin Hector

## 2017-12-29 NOTE — Telephone Encounter (Signed)
Gave MRI report to [facility], RN breast navigator for follow up as recommended by Thedore Mins, NP.

## 2017-12-29 NOTE — Transfer of Care (Signed)
Immediate Anesthesia Transfer of Care Note  Patient: Veronica Price  Procedure(s) Performed: INSERTION PORT-A-CATH LEFT SUBCLAVIAN (N/A Chest)  Patient Location: PACU  Anesthesia Type:General  Level of Consciousness: sedated  Airway & Oxygen Therapy: Patient Spontanous Breathing and Patient connected to nasal cannula oxygen  Post-op Assessment: Report given to RN and Post -op Vital signs reviewed and stable  Post vital signs: Reviewed and stable  Last Vitals:  Vitals Value Taken Time  BP 116/60 12/29/2017 10:27 AM  Temp 36.4 C 12/29/2017 10:27 AM  Pulse 63 12/29/2017 10:28 AM  Resp 9 12/29/2017 10:28 AM  SpO2 100 % 12/29/2017 10:28 AM  Vitals shown include unvalidated device data.  Last Pain:  Vitals:   12/29/17 0704  TempSrc:   PainSc: 0-No pain         Complications: No apparent anesthesia complications

## 2017-12-29 NOTE — Anesthesia Postprocedure Evaluation (Signed)
Anesthesia Post Note  Patient: Veronica Price  Procedure(s) Performed: INSERTION PORT-A-CATH LEFT SUBCLAVIAN (N/A Chest)     Patient location during evaluation: PACU Anesthesia Type: General Level of consciousness: awake Pain management: pain level controlled Vital Signs Assessment: post-procedure vital signs reviewed and stable Respiratory status: spontaneous breathing Cardiovascular status: stable Postop Assessment: no apparent nausea or vomiting Anesthetic complications: no    Last Vitals:  Vitals:   12/29/17 1127 12/29/17 1130  BP:  127/77  Pulse: 63 62  Resp: (!) 21 (!) 25  Temp:    SpO2: 97% 98%    Last Pain:  Vitals:   12/29/17 1130  TempSrc:   PainSc: 0-No pain   Pain Goal:                 [name],[name]

## 2017-12-29 NOTE — Anesthesia Preprocedure Evaluation (Signed)
Anesthesia Evaluation  Patient identified by MRN, date of birth, ID band Patient awake    Reviewed: Allergy & Precautions, NPO status , Patient's Chart, lab work & pertinent test results  Airway Mallampati: I       Dental no notable dental hx. (+) Teeth Intact   Pulmonary    Pulmonary exam normal breath sounds clear to auscultation       Cardiovascular negative cardio ROS Normal cardiovascular exam Rhythm:Regular Rate:Normal     Neuro/Psych negative neurological ROS     GI/Hepatic Neg liver ROS,   Endo/Other  negative endocrine ROS  Renal/GU negative Renal ROS  negative genitourinary   Musculoskeletal   Abdominal Normal abdominal exam  (+) + obese,   Peds  Hematology negative hematology ROS (+)   Anesthesia Other Findings   Reproductive/Obstetrics                             Anesthesia Physical Anesthesia Plan  ASA: II  Anesthesia Plan: General   Post-op Pain Management:    Induction: Intravenous  PONV Risk Score and Plan: 3 and Ondansetron and Dexamethasone  Airway Management Planned: LMA  Additional Equipment:   Intra-op Plan:   Post-operative Plan:   Informed Consent: I have reviewed the patients History and Physical, chart, labs and discussed the procedure including the risks, benefits and alternatives for the proposed anesthesia with the patient or authorized representative who has indicated his/her understanding and acceptance.   Dental advisory given  Plan Discussed with: CRNA and Surgeon  Anesthesia Plan Comments:         Anesthesia Quick Evaluation

## 2017-12-29 NOTE — Discharge Instructions (Signed)
be sure to schedule the x-ray guided biopsy of your right axillary lymph nodes as we discussed. This needs to be done in the 7-10 days         PORT-A-CATH: POST OP INSTRUCTIONS  Always review your discharge instruction sheet given to you by the facility where your surgery was performed.   1. A prescription for pain medication may be given to you upon discharge. Take your pain medication as prescribed, if needed. If narcotic pain medicine is not needed, then you make take acetaminophen (Tylenol) or ibuprofen (Advil) as needed.  2. Take your usually prescribed medications unless otherwise directed. 3. If you need a refill on your pain medication, please contact our office. All narcotic pain medicine now requires a paper prescription.  Phoned in and fax refills are no longer allowed by law.  Prescriptions will not be filled after 5 pm or on weekends.  4. You should follow a light diet for the remainder of the day after your procedure. 5. Most patients will experience some mild swelling and/or bruising in the area of the incision. It may take several days to resolve. 6. It is common to experience some constipation if taking pain medication after surgery. Increasing fluid intake and taking a stool softener (such as Colace) will usually help or prevent this problem from occurring. A mild laxative (Milk of Magnesia or Miralax) should be taken according to package directions if there are no bowel movements after 48 hours.  7. Unless discharge instructions indicate otherwise, you may remove your bandages 48 hours after surgery, and you may shower at that time. You may have steri-strips (small white skin tapes) in place directly over the incision.  These strips should be left on the skin for 7-10 days.  If your surgeon used Dermabond (skin glue) on the incision, you may shower in 24 hours.  The glue will flake off over the next 2-3 weeks.  8. If your port is left accessed at the end of surgery (needle  left in port), the dressing cannot get wet and should only by changed by a healthcare professional. When the port is no longer accessed (when the needle has been removed), follow step 7.   9. ACTIVITIES:  Limit activity involving your arms for the next 72 hours. Do no strenuous exercise or activity for 1 week. You may drive when you are no longer taking prescription pain medication, you can comfortably wear a seatbelt, and you can maneuver your car. 10.You may need to see your doctor in the office for a follow-up appointment.  Please       check with your doctor.  11.When you receive a new Port-a-Cath, you will get a product guide and        ID card.  Please keep them in case you need them.  WHEN TO CALL YOUR DOCTOR [phone]): 1. Fever over 101.0 2. Chills 3. Continued bleeding from incision 4. Increased redness and tenderness at the site 5. Shortness of breath, difficulty breathing   The clinic staff is available to answer your questions during regular business hours. Please dont hesitate to call and ask to speak to one of the nurses or medical assistants for clinical concerns. If you have a medical emergency, go to the nearest emergency room or call 911.  A surgeon from [facility] is always on call at the hospital.     For further information, please visit [email or web address]

## 2017-12-29 NOTE — Op Note (Signed)
Patient Name:           Veronica Price   Date of Surgery:        12/29/2017  Pre op Diagnosis:      Cancer right breast  Post op Diagnosis:    same  Procedure:                 Insertion of power port clear view 8 French tunneledvenous vascular access device                                      Use of fluoroscopy for guidance and positioning  Surgeon:                     Edsel Petrin. Dalbert Batman, M.D., FACS  Assistant:                      OR staff  Operative Indications:   . This is a 72 year old female from [place], [place]. She is referred by Dr. 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      She has no prior breast problems. She has felt a lump in her lateral right breast for about a month. Mammograms in [place] show a 3.7 cm lobulated noncalcified mass in the upper outer quadrant of the right breast. She also has a densely calcified retroareolar mass on the left that is been there for years and is benign. By ultrasound this mass appears to be 3.1 cm at 10:00. Multiple enlarged lymph nodes are noted in the right axillary area the largest measuring or centimeters. I have reviewed that mammogram report Image guided biopsy of the right breast reveals grade 3 invasive ductal carcinoma, triple negative breast cancer, Ki-67 greater than 80%. Image guided biopsy of the right axillary lymph node revealed only fatty tissue. This appears to be discordant.       Staging workup includes CT scan of the chest abdomen and pelvis which shows the right breast mass and the right axillary adenopathy. There were sclerotic changes in T11 but that was read as benign arthritis. There are no visceral metastasis. Echocardiogram shows ejection fraction 55-60%. There may be some minor valvular disorder Bone scan shows uptake in T11 Breast MRI is done on March 23 but is not read yet. Image guided biopsy of the right axillary lymph nodes is to be repeated and that  is pending. Family history is negative for breast or ovarian cancer.        She has seen Dr. Jana Hakim who wants to give her neoadjuvant chemotherapy to be followed by breast surgery. I think that is the most reasonable approach . We are going ahead with scheduling of the Port-A-Cath I discussed the indications, details, techniques, and numerous risk Port-A-Cath insertion. . She agrees with this plan    Operative Findings:       I was able to access the left subclavian vein with a single pass.  Catheter tip appears to be in the superior vena cava just above the right atrium.  The catheter flushes easily and had excellent blood return.  Procedure in Detail:          Following the induction of general LMA anesthesia the patient was positioned with a small roll behind her shoulders and her arms tucked at her sides.  The neck and chest were  prepped and draped in a sterile fashion.  Intravenous antibiotics were given.  Surgical timeout was performed.  0.5% Marcaine with epinephrine was used as local infiltration anesthetic.     A left subclavian venipuncture was performed with a single pass and a guidewire inserted into the superior vena cava under fluoroscopic guidance.  I drew a template on the chest wall to help with positioning of the catheter tip so that it would be in the superior vena cava near the right atrium.  Small incision was made at the wire insertion site.  A transverse incision was made about 2 cm below the left clavicle and a subcutaneous pocket created.  Using a tunneling device I passed the catheter from the port pocket site to the wire insertion site.  Using the template on the chest wall I measured the catheter and cut it proximally 25 cm in length.  The catheter was secured to the port with the locking device and the port and catheter flushed with heparinized saline.  The port was sutured to the pectoralis fascia with 3 interrupted sutures of 2-0 Prolene.  I inserted the dilator and  peel-away sheath assembly over the guidewire without difficulty.  The wire and dilator were removed, the catheter threaded and the peel-away sheath removed.  The catheter flushed easily and had excellent blood return.  Fluoroscopy confirmed good positioning of the catheter tip and no deformity of the catheter.  The port and catheter were then flushed with concentrated heparin.     The subcutaneous tissue was closed with 3-0 Vicryl sutures and the skin incisions closed with subcuticular 4-0 Monocryl and Dermabond.  The patient tolerated the procedure well and was taken to PACU where a chest x-ray will be obtained. EBL 10 mL.  Counts correct.  Complications none.    Addendum: I logged onto the [employer] and reviewed her prescription medication history     Katlyne Nishida M. Dalbert Batman, M.D., FACS General and Minimally Invasive Surgery Breast and Colorectal Surgery  12/29/2017 10:18 AM

## 2017-12-29 NOTE — Anesthesia Procedure Notes (Signed)
Procedure Name: LMA Insertion Date/Time: 12/29/2017 9:38 AM Performed by: Clearnce Sorrel, CRNA Pre-anesthesia Checklist: Patient identified, Emergency Drugs available, Suction available, Patient being monitored and Timeout performed Patient Re-evaluated:Patient Re-evaluated prior to induction Oxygen Delivery Method: Circle system utilized Preoxygenation: Pre-oxygenation with 100% oxygen Induction Type: IV induction LMA Size: 3.0 Number of attempts: 1 Placement Confirmation: positive ETCO2 and breath sounds checked- equal and bilateral Tube secured with: Tape Dental Injury: Teeth and Oropharynx as per pre-operative assessment

## 2017-12-30 ENCOUNTER — Other Ambulatory Visit: Payer: Self-pay | Admitting: General Surgery

## 2017-12-30 ENCOUNTER — Other Ambulatory Visit: Payer: Self-pay

## 2017-12-30 ENCOUNTER — Other Ambulatory Visit: Payer: 59

## 2017-12-30 ENCOUNTER — Encounter (HOSPITAL_COMMUNITY): Payer: Self-pay | Admitting: General Surgery

## 2017-12-30 DIAGNOSIS — R599 Enlarged lymph nodes, unspecified: Secondary | ICD-10-CM

## 2017-12-31 ENCOUNTER — Other Ambulatory Visit: Payer: Self-pay

## 2017-12-31 ENCOUNTER — Inpatient Hospital Stay: Payer: Medicare Other

## 2017-12-31 MED ORDER — LIDOCAINE-PRILOCAINE 2.5-2.5 % EX CREA: 1.0000 "application " | TOPICAL_CREAM | CUTANEOUS | 0 refills | Status: DC | PRN

## 2017-12-31 MED ORDER — PROCHLORPERAZINE MALEATE 10 MG PO TABS: 10.0000 mg | ORAL_TABLET | Freq: Four times a day (QID) | ORAL | 0 refills | Status: DC | PRN

## 2017-12-31 NOTE — Telephone Encounter (Signed)
Updated schedule given to patients daughter, [name]. Elma cream and compazine ordered. Release form completed and sent to HIM.  Cyndia Bent RN

## 2018-01-01 ENCOUNTER — Ambulatory Visit
Admission: RE | Admit: 2018-01-01 | Discharge: 2018-01-01 | Disposition: A | Discharge status: Home / Self Care | Payer: Medicare Other | Source: Ambulatory Visit | Attending: General Surgery | Admitting: General Surgery

## 2018-01-01 ENCOUNTER — Other Ambulatory Visit: Payer: Self-pay | Admitting: General Surgery

## 2018-01-01 ENCOUNTER — Other Ambulatory Visit: Payer: Self-pay

## 2018-01-01 DIAGNOSIS — C50411 Malignant neoplasm of upper-outer quadrant of right female breast: Secondary | ICD-10-CM

## 2018-01-01 DIAGNOSIS — R599 Enlarged lymph nodes, unspecified: Secondary | ICD-10-CM

## 2018-01-01 DIAGNOSIS — Z171 Estrogen receptor negative status [ER-]: Principal | ICD-10-CM

## 2018-01-01 DIAGNOSIS — N6489 Other specified disorders of breast: Secondary | ICD-10-CM | POA: Diagnosis not present

## 2018-01-04 ENCOUNTER — Inpatient Hospital Stay: Payer: Medicare Other | Attending: Nurse Practitioner

## 2018-01-04 ENCOUNTER — Telehealth: Payer: Self-pay | Admitting: Oncology

## 2018-01-04 ENCOUNTER — Encounter: Payer: Self-pay | Admitting: Oncology

## 2018-01-04 ENCOUNTER — Inpatient Hospital Stay (HOSPITAL_BASED_OUTPATIENT_CLINIC_OR_DEPARTMENT_OTHER): Payer: Medicare Other | Admitting: Oncology

## 2018-01-04 ENCOUNTER — Inpatient Hospital Stay: Payer: Medicare Other

## 2018-01-04 ENCOUNTER — Other Ambulatory Visit: Payer: Self-pay | Admitting: Oncology

## 2018-01-04 VITALS — BP 153/63 | HR 73 | Temp 97.6°F | Resp 18 | Ht 62.0 in | Wt 177.4 lb

## 2018-01-04 VITALS — BP 166/84 | HR 62 | Temp 98.4°F | Resp 17

## 2018-01-04 DIAGNOSIS — Z95828 Presence of other vascular implants and grafts: Secondary | ICD-10-CM

## 2018-01-04 DIAGNOSIS — Z171 Estrogen receptor negative status [ER-]: Principal | ICD-10-CM

## 2018-01-04 DIAGNOSIS — C50411 Malignant neoplasm of upper-outer quadrant of right female breast: Secondary | ICD-10-CM

## 2018-01-04 DIAGNOSIS — Z5111 Encounter for antineoplastic chemotherapy: Secondary | ICD-10-CM | POA: Diagnosis not present

## 2018-01-04 HISTORY — DX: Presence of other vascular implants and grafts: Z95.828

## 2018-01-04 LAB — CMP (CANCER CENTER ONLY)
ALBUMIN: 3.8 g/dL (ref 3.5–5.0)
ALT: 14 U/L (ref 0–55)
ANION GAP: 8 (ref 3–11)
AST: 15 U/L (ref 5–34)
Alkaline Phosphatase: 70 U/L (ref 40–150)
BUN: 18 mg/dL (ref 7–26)
CHLORIDE: 106 mmol/L (ref 98–109)
CO2: 27 mmol/L (ref 22–29)
Calcium: 9.5 mg/dL (ref 8.4–10.4)
Creatinine: 0.87 mg/dL (ref 0.60–1.10)
GFR, Estimated: >60 mL/min (ref >60)
GLUCOSE: 84 mg/dL (ref 70–140)
Potassium: 4 mmol/L (ref 3.5–5.1)
SODIUM: 141 mmol/L (ref 136–145)
Total Bilirubin: 0.4 mg/dL (ref 0.2–1.2)
Total Protein: 7.7 g/dL (ref 6.4–8.3)

## 2018-01-04 LAB — CBC WITH DIFFERENTIAL (CANCER CENTER ONLY)
Basophils Absolute: 0 10*3/uL (ref 0.0–0.1)
Basophils Relative: 1 %
Eosinophils Absolute: 0.1 10*3/uL (ref 0.0–0.5)
Eosinophils Relative: 2 %
HEMATOCRIT: 38.7 % (ref 34.8–46.6)
Hemoglobin: 12.8 g/dL (ref 11.6–15.9)
LYMPHS ABS: 1.2 10*3/uL (ref 0.9–3.3)
LYMPHS PCT: 22 %
MCH: 31.9 pg (ref 25.1–34.0)
MCHC: 33 g/dL (ref 31.5–36.0)
MCV: 96.5 fL (ref 79.5–101.0)
MONOS PCT: 12 %
Monocytes Absolute: 0.6 10*3/uL (ref 0.1–0.9)
NEUTROS ABS: 3.3 10*3/uL (ref 1.5–6.5)
Neutrophils Relative %: 63 %
Platelet Count: 211 10*3/uL (ref 145–400)
RBC: 4.01 MIL/uL (ref 3.70–5.45)
RDW: 13.8 % (ref 11.2–14.5)
WBC Count: 5.3 10*3/uL (ref 3.9–10.3)

## 2018-01-04 MED ORDER — DIPHENHYDRAMINE HCL 50 MG/ML IJ SOLN
25.0000 mg | Freq: Once | INTRAMUSCULAR | Status: AC
  Administered 2018-01-04: 25 mg via INTRAVENOUS

## 2018-01-04 MED ORDER — PALONOSETRON HCL INJECTION 0.25 MG/5ML
INTRAVENOUS | Status: AC
  Filled 2018-01-04: qty 5

## 2018-01-04 MED ORDER — HEPARIN SOD (PORK) LOCK FLUSH 100 UNIT/ML IV SOLN
500.0000 [IU] | Freq: Once | INTRAVENOUS | Status: AC | PRN
  Administered 2018-01-04: 500 [IU]
  Filled 2018-01-04: qty 5

## 2018-01-04 MED ORDER — DIPHENHYDRAMINE HCL 50 MG/ML IJ SOLN
INTRAMUSCULAR | Status: AC
  Filled 2018-01-04: qty 1

## 2018-01-04 MED ORDER — PALONOSETRON HCL INJECTION 0.25 MG/5ML
0.2500 mg | Freq: Once | INTRAVENOUS | Status: AC
  Administered 2018-01-04: 0.25 mg via INTRAVENOUS

## 2018-01-04 MED ORDER — SODIUM CHLORIDE 0.9 % IV SOLN
80.0000 mg/m2 | Freq: Once | INTRAVENOUS | Status: AC
  Administered 2018-01-04: 150 mg via INTRAVENOUS
  Filled 2018-01-04: qty 25

## 2018-01-04 MED ORDER — FAMOTIDINE IN NACL 20-0.9 MG/50ML-% IV SOLN
INTRAVENOUS | Status: AC
  Filled 2018-01-04: qty 50

## 2018-01-04 MED ORDER — SODIUM CHLORIDE 0.9 % IV SOLN
181.8000 mg | Freq: Once | INTRAVENOUS | Status: AC
  Administered 2018-01-04: 180 mg via INTRAVENOUS
  Filled 2018-01-04: qty 18

## 2018-01-04 MED ORDER — PROCHLORPERAZINE MALEATE 10 MG PO TABS: 10.0000 mg | ORAL_TABLET | Freq: Four times a day (QID) | ORAL | 1 refills | Status: DC | PRN

## 2018-01-04 MED ORDER — SODIUM CHLORIDE 0.9 % IV SOLN
20.0000 mg | Freq: Once | INTRAVENOUS | Status: AC
  Administered 2018-01-04: 20 mg via INTRAVENOUS
  Filled 2018-01-04: qty 2

## 2018-01-04 MED ORDER — LIDOCAINE-PRILOCAINE 2.5-2.5 % EX CREA: TOPICAL_CREAM | CUTANEOUS | 3 refills | Status: DC

## 2018-01-04 MED ORDER — SODIUM CHLORIDE 0.9 % IV SOLN
Freq: Once | INTRAVENOUS | Status: AC
  Administered 2018-01-04: 13:00:00 via INTRAVENOUS

## 2018-01-04 MED ORDER — SODIUM CHLORIDE 0.9% FLUSH
10.0000 mL | INTRAVENOUS | Status: DC | PRN
  Administered 2018-01-04: 10 mL
  Filled 2018-01-04: qty 10

## 2018-01-04 MED ORDER — ONDANSETRON HCL 8 MG PO TABS: 8.0000 mg | ORAL_TABLET | Freq: Two times a day (BID) | ORAL | 1 refills | Status: DC | PRN

## 2018-01-04 MED ORDER — FAMOTIDINE IN NACL 20-0.9 MG/50ML-% IV SOLN
20.0000 mg | Freq: Once | INTRAVENOUS | Status: AC
  Administered 2018-01-04: 20 mg via INTRAVENOUS

## 2018-01-04 MED ORDER — SODIUM CHLORIDE 0.9% FLUSH
10.0000 mL | Freq: Once | INTRAVENOUS | Status: AC
  Administered 2018-01-04: 10 mL
  Filled 2018-01-04: qty 10

## 2018-01-04 MED FILL — ONDANSETRON HCL 8 MG TABLET: 8 | 15 days supply | Qty: 30 | Fill #0

## 2018-01-04 NOTE — Patient Instructions (Signed)
[facility] Discharge Instructions for Patients Receiving Chemotherapy  Today you received the following chemotherapy agents Taxol and Carboplatin  To help prevent nausea and vomiting after your treatment, we encourage you to take your nausea medication as prescribed.  If you develop nausea and vomiting that is not controlled by your nausea medication, call the clinic.   BELOW ARE SYMPTOMS THAT SHOULD BE REPORTED IMMEDIATELY:  *FEVER GREATER THAN 100.5 F  *CHILLS WITH OR WITHOUT FEVER  NAUSEA AND VOMITING THAT IS NOT CONTROLLED WITH YOUR NAUSEA MEDICATION  *UNUSUAL SHORTNESS OF BREATH  *UNUSUAL BRUISING OR BLEEDING  TENDERNESS IN MOUTH AND THROAT WITH OR WITHOUT PRESENCE OF ULCERS  *URINARY PROBLEMS  *BOWEL PROBLEMS  UNUSUAL RASH Items with * indicate a potential emergency and should be followed up as soon as possible.  Feel free to call the clinic should you have any questions or concerns. The clinic phone number is [phone].  Please show the CHEMO ALERT CARD at check-in to the Emergency Department and triage nurse.  Paclitaxel injection (Taxol) What is this medicine? PACLITAXEL (PAK li TAX el) is a chemotherapy drug. It targets fast dividing cells, like cancer cells, and causes these cells to die. This medicine is used to treat ovarian cancer, breast cancer, and other cancers. This medicine may be used for other purposes; ask your health care provider or pharmacist if you have questions. COMMON BRAND NAME(S): Onxol, Taxol What should I tell my health care provider before I take this medicine? They need to know if you have any of these conditions: -blood disorders -irregular heartbeat -infection (especially a virus infection such as chickenpox, cold sores, or herpes) -liver disease -previous or ongoing radiation therapy -an unusual or allergic reaction to paclitaxel, alcohol, polyoxyethylated castor oil, other chemotherapy agents, other medicines,  foods, dyes, or preservatives -pregnant or trying to get pregnant -breast-feeding How should I use this medicine? This drug is given as an infusion into a vein. It is administered in a hospital or clinic by a specially trained health care professional. Talk to your pediatrician regarding the use of this medicine in children. Special care may be needed. Overdosage: If you think you have taken too much of this medicine contact a poison control center or emergency room at once. NOTE: This medicine is only for you. Do not share this medicine with others. What if I miss a dose? It is important not to miss your dose. Call your doctor or health care professional if you are unable to keep an appointment. What may interact with this medicine? Do not take this medicine with any of the following medications: -disulfiram -metronidazole This medicine may also interact with the following medications: -cyclosporine -diazepam -ketoconazole -medicines to increase blood counts like filgrastim, pegfilgrastim, sargramostim -other chemotherapy drugs like cisplatin, doxorubicin, epirubicin, etoposide, teniposide, vincristine -quinidine -testosterone -vaccines -verapamil Talk to your doctor or health care professional before taking any of these medicines: -acetaminophen -aspirin -ibuprofen -ketoprofen -naproxen This list may not describe all possible interactions. Give your health care provider a list of all the medicines, herbs, non-prescription drugs, or dietary supplements you use. Also tell them if you smoke, drink alcohol, or use illegal drugs. Some items may interact with your medicine. What should I watch for while using this medicine? Your condition will be monitored carefully while you are receiving this medicine. You will need important blood work done while you are taking this medicine. This medicine can cause serious allergic reactions. To reduce your risk you will need   to take other  medicine(s) before treatment with this medicine. If you experience allergic reactions like skin rash, itching or hives, swelling of the face, lips, or tongue, tell your doctor or health care professional right away. In some cases, you may be given additional medicines to help with side effects. Follow all directions for their use. This drug may make you feel generally unwell. This is not uncommon, as chemotherapy can affect healthy cells as well as cancer cells. Report any side effects. Continue your course of treatment even though you feel ill unless your doctor tells you to stop. Call your doctor or health care professional for advice if you get a fever, chills or sore throat, or other symptoms of a cold or flu. Do not treat yourself. This drug decreases your body's ability to fight infections. Try to avoid being around people who are sick. This medicine may increase your risk to bruise or bleed. Call your doctor or health care professional if you notice any unusual bleeding. Be careful brushing and flossing your teeth or using a toothpick because you may get an infection or bleed more easily. If you have any dental work done, tell your dentist you are receiving this medicine. Avoid taking products that contain aspirin, acetaminophen, ibuprofen, naproxen, or ketoprofen unless instructed by your doctor. These medicines may hide a fever. Do not become pregnant while taking this medicine. Women should inform their doctor if they wish to become pregnant or think they might be pregnant. There is a potential for serious side effects to an unborn child. Talk to your health care professional or pharmacist for more information. Do not breast-feed an infant while taking this medicine. Men are advised not to father a child while receiving this medicine. This product may contain alcohol. Ask your pharmacist or healthcare provider if this medicine contains alcohol. Be sure to tell all healthcare providers you are  taking this medicine. Certain medicines, like metronidazole and disulfiram, can cause an unpleasant reaction when taken with alcohol. The reaction includes flushing, headache, nausea, vomiting, sweating, and increased thirst. The reaction can last from 30 minutes to several hours. What side effects may I notice from receiving this medicine? Side effects that you should report to your doctor or health care professional as soon as possible: -allergic reactions like skin rash, itching or hives, swelling of the face, lips, or tongue -low blood counts - This drug may decrease the number of white blood cells, red blood cells and platelets. You may be at increased risk for infections and bleeding. -signs of infection - fever or chills, cough, sore throat, pain or difficulty passing urine -signs of decreased platelets or bleeding - bruising, pinpoint red spots on the skin, black, tarry stools, nosebleeds -signs of decreased red blood cells - unusually weak or tired, fainting spells, lightheadedness -breathing problems -chest pain -high or low blood pressure -mouth sores -nausea and vomiting -pain, swelling, redness or irritation at the injection site -pain, tingling, numbness in the hands or feet -slow or irregular heartbeat -swelling of the ankle, feet, hands Side effects that usually do not require medical attention (report to your doctor or health care professional if they continue or are bothersome): -bone pain -complete hair loss including hair on your head, underarms, pubic hair, eyebrows, and eyelashes -changes in the color of fingernails -diarrhea -loosening of the fingernails -loss of appetite -muscle or joint pain -red flush to skin -sweating This list may not describe all possible side effects. Call your doctor for medical advice   about side effects. You may report side effects to FDA at 1-800-FDA-1088. Where should I keep my medicine? This drug is given in a hospital or clinic and  will not be stored at home. NOTE: This sheet is a summary. It may not cover all possible information. If you have questions about this medicine, talk to your doctor, pharmacist, or health care provider.  2018 [employer] Standard (2015-07-24 19:58:00)  Carboplatin injection What is this medicine? CARBOPLATIN (KAR boe pla tin) is a chemotherapy drug. It targets fast dividing cells, like cancer cells, and causes these cells to die. This medicine is used to treat ovarian cancer and many other cancers. This medicine may be used for other purposes; ask your health care provider or pharmacist if you have questions. COMMON BRAND NAME(S): Paraplatin What should I tell my health care provider before I take this medicine? They need to know if you have any of these conditions: -blood disorders -hearing problems -kidney disease -recent or ongoing radiation therapy -an unusual or allergic reaction to carboplatin, cisplatin, other chemotherapy, other medicines, foods, dyes, or preservatives -pregnant or trying to get pregnant -breast-feeding How should I use this medicine? This drug is usually given as an infusion into a vein. It is administered in a hospital or clinic by a specially trained health care professional. Talk to your pediatrician regarding the use of this medicine in children. Special care may be needed. Overdosage: If you think you have taken too much of this medicine contact a poison control center or emergency room at once. NOTE: This medicine is only for you. Do not share this medicine with others. What if I miss a dose? It is important not to miss a dose. Call your doctor or health care professional if you are unable to keep an appointment. What may interact with this medicine? -medicines for seizures -medicines to increase blood counts like filgrastim, pegfilgrastim, sargramostim -some antibiotics like amikacin, gentamicin, neomycin, streptomycin, tobramycin -vaccines Talk to  your doctor or health care professional before taking any of these medicines: -acetaminophen -aspirin -ibuprofen -ketoprofen -naproxen This list may not describe all possible interactions. Give your health care provider a list of all the medicines, herbs, non-prescription drugs, or dietary supplements you use. Also tell them if you smoke, drink alcohol, or use illegal drugs. Some items may interact with your medicine. What should I watch for while using this medicine? Your condition will be monitored carefully while you are receiving this medicine. You will need important blood work done while you are taking this medicine. This drug may make you feel generally unwell. This is not uncommon, as chemotherapy can affect healthy cells as well as cancer cells. Report any side effects. Continue your course of treatment even though you feel ill unless your doctor tells you to stop. In some cases, you may be given additional medicines to help with side effects. Follow all directions for their use. Call your doctor or health care professional for advice if you get a fever, chills or sore throat, or other symptoms of a cold or flu. Do not treat yourself. This drug decreases your body's ability to fight infections. Try to avoid being around people who are sick. This medicine may increase your risk to bruise or bleed. Call your doctor or health care professional if you notice any unusual bleeding. Be careful brushing and flossing your teeth or using a toothpick because you may get an infection or bleed more easily. If you have any dental work done, tell your dentist   you are receiving this medicine. Avoid taking products that contain aspirin, acetaminophen, ibuprofen, naproxen, or ketoprofen unless instructed by your doctor. These medicines may hide a fever. Do not become pregnant while taking this medicine. Women should inform their doctor if they wish to become pregnant or think they might be pregnant. There is a  potential for serious side effects to an unborn child. Talk to your health care professional or pharmacist for more information. Do not breast-feed an infant while taking this medicine. What side effects may I notice from receiving this medicine? Side effects that you should report to your doctor or health care professional as soon as possible: -allergic reactions like skin rash, itching or hives, swelling of the face, lips, or tongue -signs of infection - fever or chills, cough, sore throat, pain or difficulty passing urine -signs of decreased platelets or bleeding - bruising, pinpoint red spots on the skin, black, tarry stools, nosebleeds -signs of decreased red blood cells - unusually weak or tired, fainting spells, lightheadedness -breathing problems -changes in hearing -changes in vision -chest pain -high blood pressure -low blood counts - This drug may decrease the number of white blood cells, red blood cells and platelets. You may be at increased risk for infections and bleeding. -nausea and vomiting -pain, swelling, redness or irritation at the injection site -pain, tingling, numbness in the hands or feet -problems with balance, talking, walking -trouble passing urine or change in the amount of urine Side effects that usually do not require medical attention (report to your doctor or health care professional if they continue or are bothersome): -hair loss -loss of appetite -metallic taste in the mouth or changes in taste This list may not describe all possible side effects. Call your doctor for medical advice about side effects. You may report side effects to FDA at 1-800-FDA-1088. Where should I keep my medicine? This drug is given in a hospital or clinic and will not be stored at home. NOTE: This sheet is a summary. It may not cover all possible information. If you have questions about this medicine, talk to your doctor, pharmacist, or health care provider.  2018 [employer]/Gold  Standard (2007-12-28 14:38:05)  

## 2018-01-04 NOTE — Progress Notes (Signed)
Veronica Price  [phone] Fax:([phone]) [phone]     ID: WINDI TORO DOB: 05-Nov-1945  MR#: [id number]  OHY#:[id number]  Patient Care Team: [name], MD as PCP - General [name], [name], MD as Consulting Physician (Oncology) [name], [name], MD as Consulting Physician (Oncology) [name], MD as Consulting Physician (General Surgery) OTHER MD:  CHIEF COMPLAINT: Triple negative breast cancer  CURRENT TREATMENT: Neoadjuvant chemotherapy    HISTORY OF CURRENT ILLNESS: Veronica Price initially palpated a mass to her right breast in February 2019.   She brought her to her primary care physician's attention and was set up for bilateral diagnostic mammography with tomography and right breast ultrasonography on 11/25/2017 showing: a 3.2 x 3.7 cm lobulated mass in the UOQ of the right breast; there was a 3 cm calcified retroareolar mass in the left breast. On right breast ultrasonography there was a complex mostly solid 2.4 x 3.1 cm mass at 10 o'clock and multiple enlarged right axillary lymph nodes, the largest measuring 1.6 x 2.4.  Accordingly on 11/27/2017 the patient proceeded to biopsy of the right breast area in question, as well as an attempted axillary node biopsy. The pathology from this procedure showed ([id number]):  high grade infiltrating ductal carcinoma with no lymphovascular invasion.  The attempted axillary node biopsy showed fat, no nodal tissue.  Prognostic indicators were: estrogen and progesterone receptor negative,. Proliferation marker Ki67 at >80%. HER2 negative by immunohistochemistry at 0%  CT scans of the chest abdomen and pelvis completed on 12/10/2017 showed A complex solid and cystic 3.5 cm right breast mass with several bilateral axillary lymph nodes, the largest in the right axilla measured 12 mm. There was no evidence of visceral metastatic disease.   She had a bone scan on 12/10/2017 that showed two areas of increased  uptake overlying the lateral aspects of the T11 vertebrae. Review of the CT scan of the chest revealed sclerosis of the T11 pedicles concerning for bone metastases.  However subsequent spinal MRI (I do not have that report) read this out as arthritis, not a metastatic deposit  The patient had a echocardiogram completed on 3/72019 with results showing: left ventricular ejection fraction of 55-60% with mild concentric hypertrophy. There was mild to moderate mitral and mild tricuspid regurgitation with mild pulmonary hypertension. The aortic valve was mildly thickened with trace aortic regurgitation.   The patient's subsequent history is as detailed below.  INTERVAL HISTORY: Veronica Price returns today for follow up and treatment of her triple negative breast cancer, accompanied by her husband and 2 daughters. She beings neoadjuvant chemotherapy with weekly paclitaxel/ carboplatin,  today being day 1 cycle 1.   She has moved in with her Veronica Price her daughter so that she does not have to travel so much for her weekly treatments.  She underwent port placement under Dr. Dalbert Batman on 12/29/2017. She had some pain after the port was placed, but this has resolved.   Ultrasound of the right axilla on 01/01/2018 showed no axillary lymphadenopathy.    REVIEW OF SYSTEMS: Veronica Price reports that she is staying with her daughter, who has been taking care of her. She has been walking and taking it easy. She denies unusual headaches, visual changes, nausea, vomiting, or dizziness. There has been no unusual cough, phlegm production, or pleurisy. This been no change in bowel or bladder habits. She denies unexplained fatigue or unexplained weight loss, bleeding, rash, or fever. A detailed review of systems was otherwise stable.  PAST MEDICAL HISTORY: Past Medical History:  Diagnosis Date  . Arthritis   . Breast cancer ()    Right breast  . Cataract    "early" per patient  . GERD (gastroesophageal reflux disease)     occasionaly tums    PAST SURGICAL HISTORY: Past Surgical History:  Procedure Laterality Date  . ABDOMINAL HYSTERECTOMY     Total hysterectomy in 2002  . PORTACATH PLACEMENT N/A 12/29/2017   Procedure: INSERTION PORT-A-CATH LEFT SUBCLAVIAN;  Surgeon: [name], MD;  Location: [facility];  Service: General;  Laterality: N/A;    FAMILY HISTORY Family History  Problem Relation Age of Onset  . Diabetes Father   . Heart attack Father    She notes that her father died from a possible MI in his early 62's Her mother is still alive and will be 04 December 2017!  The patient has 2 brothers and 2 sisters. Her brother had prostate cancer possibly agent orange related. She has a cousin who was recently diagnosed with stomach cancer. Her maternal grandmother died from colon cancer. She has a maternal uncle with bone cancer. She denies family hx of breast or ovarian cancer.    GYNECOLOGIC HISTORY:  Menarche: 71 years old Age at first live birth: 72 years old [place] P3 LMP: at age 58 Contraceptive: OCP HRT   Hysterectomy? 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 She also worked at [employer]. She lives at home with her husband, Veronica Price who is a retired Building control surveyor, her husband works part time driving an adult day care bus. Her daughter, Veronica Price is a Multimedia programmer. Her daughter Veronica Price lives in [place], [place] and works as a Therapist, sports for the [place]. Her son Veronica Price, lives in [place], and works as a Administrator.  The patient has 3 grandchildren and no great-grandchildren. She is of baptist faith.     ADVANCED DIRECTIVES:    HEALTH MAINTENANCE: Social History   Tobacco Use  . Smoking status: Never Smoker  . Smokeless tobacco: Never Used  Substance Use Topics  . Alcohol use: Never    Frequency: Never  . Drug use: Never     Colonoscopy: UTD; last year (2018)  PAP:  Bone density:     Allergies  Allergen Reactions  . Prednisone Swelling and Other (See Comments)    " I think it was this that made my tongue swell "    Current Outpatient Medications  Medication Sig Dispense Refill  . HYDROcodone-acetaminophen (NORCO) 5-325 MG tablet Take 1-2 tablets by mouth every 6 (six) hours as needed for moderate pain or severe pain. 20 tablet 0  . lidocaine-prilocaine (EMLA) cream Apply 1 application topically as needed. 30 g 0  . lidocaine-prilocaine (EMLA) cream Apply to affected area once 30 g 3  . naproxen sodium (ALEVE) 220 MG tablet Take 220 mg by mouth daily as needed (for pain).    . ondansetron (ZOFRAN) 8 MG tablet Take 1 tablet (8 mg total) by mouth 2 (two) times daily as needed for refractory nausea / vomiting. Start on day 3 after chemo. 30 tablet 1  . prochlorperazine (COMPAZINE) 10 MG tablet Take 1 tablet (10 mg total) by mouth every 6 (six) hours as needed for nausea or vomiting. 30 tablet 0  . prochlorperazine (COMPAZINE) 10 MG tablet Take 1 tablet (10 mg total) by mouth every 6 (six) hours as needed (  Nausea or vomiting). 30 tablet 1   No current facility-administered medications for this visit.     OBJECTIVE: Older African-American woman who appears well  Vitals:   01/04/18 1205  BP: (!) 153/63  Pulse: 73  Resp: 18  Temp: 97.6 F (36.4 C)  SpO2: 99%     Body mass index is 32.45 kg/m.   Wt Readings from Last 3 Encounters:  01/04/18 177 lb 6.4 oz (80.5 kg)  12/29/17 170 lb (77.1 kg)  12/21/17 181 lb 9.6 oz (82.4 kg)      ECOG FS:0 - Asymptomatic  Sclerae unicteric, EOMs intact No cervical or supraclavicular adenopathy Lungs no rales or rhonchi Heart regular rate and rhythm Abd soft, nontender, positive bowel sounds MSK no focal spinal tenderness, no upper extremity lymphedema Neuro: nonfocal, well oriented, appropriate affect Breasts: The mass in the right breast upper outer quadrant is still palpable and movable.  It is not tender and there is  no associated erythema.  The left breast was unremarkable.  Both axillae are benign.  LAB RESULTS:  CMP     Component Value Date/Time   NA 139 12/29/2017 0701   K 3.9 12/29/2017 0701   CL 103 12/29/2017 0701   CO2 24 12/29/2017 0701   GLUCOSE 102 (H) 12/29/2017 0701   BUN 11 12/29/2017 0701   CREATININE 0.74 12/29/2017 0701   CALCIUM 9.1 12/29/2017 0701   GFRNONAA >60 12/29/2017 0701   GFRAA >60 12/29/2017 0701    No results found for: TOTALPROTELP, ALBUMINELP, A1GS, A2GS, BETS, BETA2SER, GAMS, MSPIKE, SPEI  No results found for: KPAFRELGTCHN, LAMBDASER, KAPLAMBRATIO  Lab Results  Component Value Date   WBC 4.4 12/29/2017   HGB 13.0 12/29/2017   HCT 40.5 12/29/2017   MCV 98.3 12/29/2017   PLT 228 12/29/2017    _0 @  No results found for: LABCA2  No components found for: QGBEEF007  No results for input(s): INR in the last 168 hours.  No results found for: LABCA2  No results found for: HQR975  No results found for: OIT254  No results found for: DIY641  No results found for: CA2729  No components found for: HGQUANT  No results found for: CEA1 / No results found for: CEA1   No results found for: AFPTUMOR  No results found for: CHROMOGRNA  No results found for: PSA1  No visits with results within 3 Day(s) from this visit.  Latest known visit with results is:  Admission on 12/29/2017, Discharged on 12/29/2017  Component Date Value Ref Range Status  . WBC 12/29/2017 4.4  4.0 - 10.5 K/uL Final  . RBC 12/29/2017 4.12  3.87 - 5.11 MIL/uL Final  . Hemoglobin 12/29/2017 13.0  12.0 - 15.0 g/dL Final  . HCT 12/29/2017 40.5  36.0 - 46.0 % Final  . MCV 12/29/2017 98.3  78.0 - 100.0 fL Final  . MCH 12/29/2017 31.6  26.0 - 34.0 pg Final  . MCHC 12/29/2017 32.1  30.0 - 36.0 g/dL Final  . RDW 12/29/2017 13.7  11.5 - 15.5 % Final  . Platelets 12/29/2017 228  150 - 400 K/uL Final   Performed at [facility], [place] [address], [place], [place]  [address]  . Sodium 12/29/2017 139  135 - 145 mmol/L Final  . Potassium 12/29/2017 3.9  3.5 - 5.1 mmol/L Final  . Chloride 12/29/2017 103  101 - 111 mmol/L Final  . CO2 12/29/2017 24  22 - 32 mmol/L Final  . Glucose, Bld 12/29/2017 102* 65 -  99 mg/dL Final  . BUN 12/29/2017 11  6 - 20 mg/dL Final  . Creatinine, Ser 12/29/2017 0.74  0.44 - 1.00 mg/dL Final  . Calcium 12/29/2017 9.1  8.9 - 10.3 mg/dL Final  . GFR calc non Af Amer 12/29/2017 >60  >60 mL/min Final  . GFR calc Af Amer 12/29/2017 >60  >60 mL/min Final   Comment: (NOTE) The eGFR has been calculated using the CKD EPI equation. This calculation has not been validated in all clinical situations. eGFR's persistently <60 mL/min signify possible Chronic Kidney Disease.   Georgiann Hahn gap 12/29/2017 12  5 - 15 Final   Performed at [facility], [place] [address] [place], [place] [address]    (this displays the last labs from the last 3 days)  No results found for: TOTALPROTELP, ALBUMINELP, A1GS, A2GS, BETS, BETA2SER, GAMS, MSPIKE, SPEI (this displays SPEP labs)  No results found for: KPAFRELGTCHN, LAMBDASER, KAPLAMBRATIO (kappa/lambda light chains)  No results found for: HGBA, HGBA2QUANT, HGBFQUANT, HGBSQUAN (Hemoglobinopathy evaluation)   No results found for: LDH  No results found for: IRON, TIBC, IRONPCTSAT (Iron and TIBC)  No results found for: FERRITIN  Urinalysis No results found for: COLORURINE, APPEARANCEUR, LABSPEC, PHURINE, GLUCOSEU, HGBUR, BILIRUBINUR, KETONESUR, PROTEINUR, UROBILINOGEN, NITRITE, LEUKOCYTESUR   STUDIES: Mr Breast Bilateral W Wo Contrast Inc Cad  Result Date: 12/29/2017 CLINICAL DATA:  Patient with recent diagnosis triple negative right breast cancer. LABS:  Creatinine was obtained on site at [facility] at [address] Results: Creatinine 0.9 mg/dL. EXAM: BILATERAL BREAST MRI WITH AND WITHOUT CONTRAST TECHNIQUE: Multiplanar, multisequence MR images of both breasts were obtained  prior to and following the intravenous administration of 15 ml of MultiHance. THREE-DIMENSIONAL MR IMAGE RENDERING ON INDEPENDENT WORKSTATION: Three-dimensional MR images were rendered by post-processing of the original MR data on an independent workstation. The three-dimensional MR images were interpreted, and findings are reported in the following complete MRI report for this study. Three dimensional images were evaluated at the independent DynaCad workstation COMPARISON:  Previous exam(s). FINDINGS: Breast composition: b. Scattered fibroglandular tissue. Background parenchymal enhancement: Mild Right breast: Within the upper-outer right breast there is a 3.3 x 2.6 x 2.9 cm solid mass with cystic components, potentially representing associated necrosis or post biopsy changes. The solid component measures 2.4 x 2.2 x 2.2 cm. No additional suspicious areas of enhancement identified within the right breast. Left breast: No mass or abnormal enhancement. Lymph nodes: No abnormal appearing lymph nodes. Ancillary findings:  Moderate hiatal hernia. IMPRESSION: Enhancing mass within the upper-outer right breast, felt to correspond with recently diagnosed malignancy. No additional suspicious areas of enhancement identified within either breast. RECOMMENDATION: No outside biopsy images or clip films were available at the time of MRI interpretation. The initial diagnostic mammogram was available. Recommend confirmation that a biopsy marking clip was placed prior to neoadjuvant chemotherapy. If these films are not able to be obtained, a diagnostic right breast mammogram can be obtained at the breast center prior to administering treatment. Treatment plan for known right breast malignancy. BI-RADS CATEGORY  6: Known biopsy-proven malignancy. Electronically Signed   By: Lovey Newcomer M.D.   On: 12/29/2017 13:38   Dg Chest Port 1 View  Result Date: 12/29/2017 CLINICAL DATA:  Port catheter insertion. EXAM: PORTABLE CHEST 1 VIEW  COMPARISON:  None. FINDINGS: Left subclavian port catheter with the tip at the cavoatrial junction. The heart size and mediastinal contours are within normal limits. Normal pulmonary vascularity. No focal consolidation, pleural effusion,  or pneumothorax. No acute osseous abnormality. IMPRESSION: No active disease.  No pneumothorax. Electronically Signed   By: Titus Dubin M.D.   On: 12/29/2017 11:03   Dg Fluoro Guide Cv Line-no Report  Result Date: 12/29/2017 Fluoroscopy was utilized by the requesting physician.  No radiographic interpretation.   [place] Axilla Right  Result Date: 01/01/2018 CLINICAL DATA:  Right axillary ultrasound EXAM: ULTRASOUND OF THE RIGHT BREAST COMPARISON:  Previous exam(s). FINDINGS: On physical exam, no suspicious lumps identified. Targeted ultrasound is performed, showing numerous normal appearing lymph nodes in the right axilla. Additionally, the lymph nodes are normal on the MRI recently performed. Finally, mildly prominent nodes in the right axilla seen mammographically are unchanged since June of 2017 and were noted to be normal with thin cortices and preserved fatty hila on today's ultrasound. IMPRESSION: No axillary adenopathy. RECOMMENDATION: Continued follow-up by oncology and surgery. I have discussed the findings and recommendations with the patient. Results were also provided in writing at the conclusion of the visit. If applicable, a reminder letter will be sent to the patient regarding the next appointment. BI-RADS CATEGORY  2: Benign. Electronically Signed   By: Dorise Bullion III M.D   On: 01/01/2018 15:40   Mm Clip Placement Right  Result Date: 01/01/2018 CLINICAL DATA:  Evaluate for post biopsy marker. EXAM: DIAGNOSTIC RIGHT MAMMOGRAM POST ULTRASOUND BIOPSY COMPARISON:  Previous exam(s). FINDINGS: Mammographic images were obtained following ultrasound guided biopsy of a right breast mass. A spiral shaped clip is within the superior aspect of the biopsied mass.  No clips identified within any visualized lymph nodes. IMPRESSION: Appropriate clip placement within the superior aspect of the known breast cancer. Final Assessment: Post Procedure Mammograms for Marker Placement Electronically Signed   By: Dorise Bullion III M.D   On: 01/01/2018 15:28    ELIGIBLE FOR AVAILABLE RESEARCH PROTOCOL: Consider SWOG [id number] post-op  ASSESSMENT: 72 y.o. [name], [place] woman status post right breast upper outer quadrant biopsy 11/25/2017 for a clinical T2 N2, stage IIIc invasive ductal carcinoma, grade 3, triple negative, with an MIB-1 of 80%.  (1) staging studies:  (a) chest CT 12/10/2017 scan showed no visceral metastatic disease  (b) bone scan 12/10/2017 showed uptake at T11, with sclerosis.  (c) CA-27-29 on 12/05/2017 was 19.0  (d) spinal MRI in [place] reportedly showed only arthrtis at T11  (2) neoadjuvant chemotherapy consisting of carboplatin/ paclitaxel x 12 starting 01/04/2018, to be followed by cytoxan/ adriamycin x 4  (3) definitive surgery to follow  (4) adjuvant radiation as appropriate   PLAN: Kaedyn did well with her port and has moved in with her daughter in [place] so that she does not have transportation problems to come to chemo.  They greatly appreciated the input from our chemotherapy teaching nurse.  She is now ready to start her therapy.  As far as nausea control is concerned, she will be on ondansetron the evening of treatment and then the next morning.  She will use prochlorperazine every 6 hours as needed in addition.  We discussed the possible toxicities, side effects and complications of these agents  I am going to see her in a week, with her second dose.  Once she has had a couple of treatments under her belt and she feels more confident she may want to be treated in our any [place] office if she wishes, or she may want to continue to receive all her treatments here, her choice.  I asked her to keep a symptom diary so we  can review it  in a week.  Her daughter brought me some FMLA papers which we will try to get back to her when she returns next week with her mother.  She knows to call for any issues that may develop before her next visit.   [name], [name], MD  01/04/18 12:19 PM Medical Oncology and Hematology [facility] [address] [place], [place] [address] Tel. [phone]    Fax. [phone]  This document serves as a record of services personally performed by Lurline Del, MD. It was created on his behalf by Sheron Nightingale, a trained medical scribe. The creation of this record is based on the scribe's personal observations and the provider's statements to them.   I have reviewed the above documentation for accuracy and completeness, and I agree with the above.

## 2018-01-04 NOTE — Progress Notes (Signed)
Met w/ pt to introduce myself as her Arboriculturist.  She has 2 insurances so copay assistance shouldn't be needed.  I offered the [employer], went over what it covers, gave her an expense sheet and gave her the income requirement.  Pt's husband will review their income and if she qualifies he will call me and provide the proof of income.  She has my card for any questions or concerns she may have in the future.

## 2018-01-04 NOTE — Telephone Encounter (Signed)
Gave avs and calendar ° °

## 2018-01-04 NOTE — Progress Notes (Signed)
Confirmed w/ Dr. Jana Hakim by phone - dose of Taxol will use most recent BSA = 1.88 m2 (not 1.21 m2). Kennith Center, Pharm.D., CPP 01/04/2018@1 :23 PM

## 2018-01-05 ENCOUNTER — Other Ambulatory Visit: Payer: Medicare Other

## 2018-01-05 ENCOUNTER — Ambulatory Visit: Payer: 59 | Admitting: Adult Health

## 2018-01-05 ENCOUNTER — Ambulatory Visit: Payer: 59

## 2018-01-05 ENCOUNTER — Ambulatory Visit: Payer: Medicare Other | Admitting: Adult Health

## 2018-01-05 ENCOUNTER — Telehealth: Payer: Self-pay

## 2018-01-05 ENCOUNTER — Telehealth: Payer: Self-pay | Admitting: Emergency Medicine

## 2018-01-05 NOTE — Telephone Encounter (Signed)
-----   Message from Thomasene Lot, RN sent at 01/04/2018  3:19 PM EDT ----- Regarding: Dr Jana Hakim 1st tx f/u call 1st tx f/u call

## 2018-01-05 NOTE — Telephone Encounter (Signed)
TCT patient.  Denies any current issues or concerns.  Verbalized understanding that she can call us for any problems.

## 2018-01-05 NOTE — Telephone Encounter (Signed)
Received FMLA paper work from Dr Jana Hakim from pt, she had appt yesterday.  I gave those papers to be completed to [facility] person [place].

## 2018-01-07 ENCOUNTER — Ambulatory Visit: Payer: Medicare Other

## 2018-01-07 ENCOUNTER — Other Ambulatory Visit: Payer: Medicare Other

## 2018-01-07 ENCOUNTER — Ambulatory Visit: Payer: Medicare Other | Admitting: Oncology

## 2018-01-07 ENCOUNTER — Telehealth: Payer: Self-pay | Admitting: Oncology

## 2018-01-07 NOTE — Telephone Encounter (Signed)
01/07/2018 @ 12:45 pm, spoke with patient's daughter, Keta [place], to inform FMLA has been successfully faxed to [place] at [phone]. Also mailed a copy to daughter per her request at [address], [place],  [address].

## 2018-01-08 ENCOUNTER — Other Ambulatory Visit: Payer: Self-pay | Admitting: *Deleted

## 2018-01-08 ENCOUNTER — Telehealth: Payer: Self-pay | Admitting: *Deleted

## 2018-01-08 DIAGNOSIS — C50411 Malignant neoplasm of upper-outer quadrant of right female breast: Secondary | ICD-10-CM

## 2018-01-08 DIAGNOSIS — Z171 Estrogen receptor negative status [ER-]: Principal | ICD-10-CM

## 2018-01-08 NOTE — Telephone Encounter (Signed)
This RN spoke with pt's dtr - Veronica Price who called wanting to inquire about referral for mother " she seems more stressed and there have some changes in her behavior "  Of note Veronica Price states her mother is unaware she is calling ( dtr in on [employer] release as well as attends visits with the patient).  This RN informed Veronica Price above can be arranged- but she may not be able to be seen quickly by an outside counselor - this RN discussed services of resource per this office - resources reviewed with Veronica Price feeling a consult with the chaplain would be best received.  This RN will send this note to MD per scheduled visit on Monday and to [employer].

## 2018-01-10 NOTE — Progress Notes (Signed)
[place]  [phone] [phone]     ID: Veronica Price DOB: [birth year]  MR#: [id number]  PFX#:[id number]  Patient Care Team: [name], MD as PCP - General Obera Stauch, [name], MD as Consulting Physician (Oncology) Ryoma Nofziger, [name], MD as Consulting Physician (Oncology) [name], MD as Consulting Physician (General Surgery) OTHER MD:  CHIEF COMPLAINT: Triple negative breast cancer  CURRENT TREATMENT: Neoadjuvant chemotherapy    HISTORY OF CURRENT ILLNESS: Veronica Price initially palpated a mass to her right breast in February 2019.   She brought her to her primary care physician's attention and was set up for bilateral diagnostic mammography with tomography and right breast ultrasonography on 11/25/2017 showing: a 3.2 x 3.7 cm lobulated mass in the UOQ of the right breast; there was a 3 cm calcified retroareolar mass in the left breast. On right breast ultrasonography there was a complex mostly solid 2.4 x 3.1 cm mass at 10 o'clock and multiple enlarged right axillary lymph nodes, the largest measuring 1.6 x 2.4.  Accordingly on 11/27/2017 the patient proceeded to biopsy of the right breast area in question, as well as an attempted axillary node biopsy. The pathology from this procedure showed ([id number]):  high grade infiltrating ductal carcinoma with no lymphovascular invasion.  The attempted axillary node biopsy showed fat, no nodal tissue.  Prognostic indicators were: estrogen and progesterone receptor negative,. Proliferation marker Ki67 at >80%. HER2 negative by immunohistochemistry at 0%  CT scans of the chest abdomen and pelvis completed on 12/10/2017 showed A complex solid and cystic 3.5 cm right breast mass with several bilateral axillary lymph nodes, the largest in the right axilla measured 12 mm. There was no evidence of visceral metastatic disease.   She had a bone scan on 12/10/2017 that showed two areas of increased  uptake overlying the lateral aspects of the T11 vertebrae. Review of the CT scan of the chest revealed sclerosis of the T11 pedicles concerning for bone metastases.  However subsequent spinal MRI (I do not have that report) read this out as arthritis, not a metastatic deposit  The patient had a echocardiogram completed on 3/72019 with results showing: left ventricular ejection fraction of 55-60% with mild concentric hypertrophy. There was mild to moderate mitral and mild tricuspid regurgitation with mild pulmonary hypertension. The aortic valve was mildly thickened with trace aortic regurgitation.   The patient's subsequent history is as detailed below.  INTERVAL HISTORY: Veronica Price returns today for follow up and treatment of her triple negative breast cancer, accompanied by her husband. She is currently undergoing neoadjuvant chemotherapy with weekly paclitaxel/carboplatin, today being day 1 cycle 2 of 12 cycles planned, to be followed by 4 cycles of dose dense doxorubicin/cyclophosphamide.   She stayed 5-6 days following her treatment with her daughter in [place], [place] and her husband drove her from [place] this morning for the appointment. She notes that her first cycle went well, although she notes that on the first day following she had dizziness that has resolved. She has noticed cramps in her BLE and her bilateral feet, she was doing laundry prior to the onset of her symptoms. She has started to drink more water and she has been eating bananas and oranges daily in the morning. She notes that she has transient numbness/tingling to her right arm following sleeping on her arm at night. She had constipation following her last cycle that was treated with OTC stool softener and prune juice with relief of her symptoms. She is  able to complete her daily activities following treatment. She has taken the anti-emetic as prescribed. She hasn't had any nausea since the start of her treatment. She denies mouth sores,  nail problems.   Incidentally the patient's daughter Veronica Price called [place] to let us know that the patient was "not acting right" meaning that she was very irritable and she thought a little counseling might be helpful.  We have alerted our chaplain to see the patient today if at all possible  REVIEW OF SYSTEMS: Veronica Price reports that for exercise, she has completed a lot of ironing and washing clothes recently. She denies unusual headaches, visual changes, nausea, or vomiting. There has been no unusual cough, phlegm production, or pleurisy. This been no change in bowel or bladder habits. She denies unexplained fatigue or unexplained weight loss, bleeding, rash, or fever. A detailed review of systems was otherwise stable.      PAST MEDICAL HISTORY: Past Medical History:  Diagnosis Date  . Arthritis   . Breast cancer ([place])    Right breast  . Cataract    "early" per patient  . GERD (gastroesophageal reflux disease)    occasionaly tums    PAST SURGICAL HISTORY: Past Surgical History:  Procedure Laterality Date  . ABDOMINAL HYSTERECTOMY     Total hysterectomy in 2002  . PORTACATH PLACEMENT N/A 12/29/2017   Procedure: INSERTION PORT-A-CATH LEFT SUBCLAVIAN;  Surgeon: [name], MD;  Location: [facility];  Service: General;  Laterality: N/A;    FAMILY HISTORY Family History  Problem Relation Age of Onset  . Diabetes Father   . Heart attack Father    She notes that her father died from a possible MI in his early 53's Her mother is still alive and will be 45 March 2019!  The patient has 2 brothers and 2 sisters. Her brother had prostate cancer possibly agent orange related. She has a cousin who was recently diagnosed with stomach cancer. Her maternal grandmother died from colon cancer. She has a maternal uncle with bone cancer. She denies family hx of breast or ovarian cancer.    GYNECOLOGIC HISTORY:  Menarche: 72 years old Age at first live birth: 72 years old [place] P3 LMP: at age  36 Contraceptive: OCP HRT   Hysterectomy? 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 She also worked at [employer]. She lives at home with her husband, Veronica Price who is a retired Building control surveyor, her husband works part time driving an adult day care bus. Her daughter, Veronica Price is a Multimedia programmer. Her daughter Veronica Price lives in [place], [place] and works as a Therapist, sports for the [place]. Her son Veronica Price, lives in [place], and works as a Administrator.  The patient has 3 grandchildren and no great-grandchildren. She is of baptist faith.     ADVANCED DIRECTIVES:    HEALTH MAINTENANCE: Social History   Tobacco Use  . Smoking status: Never Smoker  . Smokeless tobacco: Never Used  Substance Use Topics  . Alcohol use: Never    Frequency: Never  . Drug use: Never     Colonoscopy: UTD; last year (2018)  PAP:  Bone density:    Allergies  Allergen Reactions  . Prednisone Swelling and Other (See Comments)    " I think it was this that made my tongue swell "    Current Outpatient Medications  Medication Sig Dispense Refill  . HYDROcodone-acetaminophen (  NORCO) 5-325 MG tablet Take 1-2 tablets by mouth every 6 (six) hours as needed for moderate pain or severe pain. 20 tablet 0  . lidocaine-prilocaine (EMLA) cream Apply 1 application topically as needed. 30 g 0  . lidocaine-prilocaine (EMLA) cream Apply to affected area once 30 g 3  . naproxen sodium (ALEVE) 220 MG tablet Take 220 mg by mouth daily as needed (for pain).    . ondansetron (ZOFRAN) 8 MG tablet Take 1 tablet (8 mg total) by mouth 2 (two) times daily as needed for refractory nausea / vomiting. Start on day 3 after chemo. 30 tablet 1  . prochlorperazine (COMPAZINE) 10 MG tablet Take 1 tablet (10 mg total) by mouth every 6 (six) hours as needed for nausea or vomiting. 30 tablet 0  . prochlorperazine (COMPAZINE) 10 MG tablet Take 1 tablet (10 mg  total) by mouth every 6 (six) hours as needed (Nausea or vomiting). 30 tablet 1   No current facility-administered medications for this visit.     OBJECTIVE: Older African-American woman in no acute distress  Vitals:   01/11/18 0845  BP: (!) 151/87  Pulse: 70  Resp: 18  Temp: 98.2 F (36.8 C)  SpO2: 100%     Body mass index is 32.67 kg/m.   Wt Readings from Last 3 Encounters:  01/11/18 178 lb 9.6 oz (81 kg)  01/04/18 177 lb 6.4 oz (80.5 kg)  12/29/17 170 lb (77.1 kg)      ECOG FS:1 - Symptomatic but completely ambulatory  Sclerae unicteric, pupils round and equal Oropharynx clear and moist No cervical or supraclavicular adenopathy Lungs no rales or rhonchi Heart regular rate and rhythm Abd soft, nontender, positive bowel sounds MSK no focal spinal tenderness, no upper extremity lymphedema Neuro: nonfocal, well oriented, appropriate affect Breasts: Deferred   LAB RESULTS:  CMP     Component Value Date/Time   NA 141 01/04/2018 1147   K 4.0 01/04/2018 1147   CL 106 01/04/2018 1147   CO2 27 01/04/2018 1147   GLUCOSE 84 01/04/2018 1147   BUN 18 01/04/2018 1147   CREATININE 0.87 01/04/2018 1147   CALCIUM 9.5 01/04/2018 1147   PROT 7.7 01/04/2018 1147   ALBUMIN 3.8 01/04/2018 1147   AST 15 01/04/2018 1147   ALT 14 01/04/2018 1147   ALKPHOS 70 01/04/2018 1147   BILITOT 0.4 01/04/2018 1147   GFRNONAA >60 01/04/2018 1147   GFRAA >60 01/04/2018 1147    No results found for: TOTALPROTELP, ALBUMINELP, A1GS, A2GS, BETS, BETA2SER, GAMS, MSPIKE, SPEI  No results found for: KPAFRELGTCHN, LAMBDASER, KAPLAMBRATIO  Lab Results  Component Value Date   WBC 5.3 01/04/2018   NEUTROABS 3.3 01/04/2018   HGB 13.0 12/29/2017   HCT 38.7 01/04/2018   MCV 96.5 01/04/2018   PLT 211 01/04/2018    _0 @  No results found for: LABCA2  No components found for: EXHBZJ696  No results for input(s): INR in the last 168 hours.  No results found for: LABCA2  No  results found for: VEL381  No results found for: OFB510  No results found for: CHE527  No results found for: CA2729  No components found for: HGQUANT  No results found for: CEA1 / No results found for: CEA1   No results found for: AFPTUMOR  No results found for: CHROMOGRNA  No results found for: PSA1  No visits with results within 3 Day(s) from this visit.  Latest known visit with results is:  Appointment on 01/04/2018  Component Date Value  Ref Range Status  . Sodium 01/04/2018 141  136 - 145 mmol/L Final  . Potassium 01/04/2018 4.0  3.5 - 5.1 mmol/L Final  . Chloride 01/04/2018 106  98 - 109 mmol/L Final  . CO2 01/04/2018 27  22 - 29 mmol/L Final  . Glucose, Bld 01/04/2018 84  70 - 140 mg/dL Final  . BUN 01/04/2018 18  7 - 26 mg/dL Final  . Creatinine 01/04/2018 0.87  0.60 - 1.10 mg/dL Final  . Calcium 01/04/2018 9.5  8.4 - 10.4 mg/dL Final  . Total Protein 01/04/2018 7.7  6.4 - 8.3 g/dL Final  . Albumin 01/04/2018 3.8  3.5 - 5.0 g/dL Final  . AST 01/04/2018 15  5 - 34 U/L Final  . ALT 01/04/2018 14  0 - 55 U/L Final  . Alkaline Phosphatase 01/04/2018 70  40 - 150 U/L Final  . Total Bilirubin 01/04/2018 0.4  0.2 - 1.2 mg/dL Final  . GFR, Est Non Af Am 01/04/2018 >60  >60 mL/min Final  . GFR, Est AFR Am 01/04/2018 >60  >60 mL/min Final   Comment: (NOTE) The eGFR has been calculated using the CKD EPI equation. This calculation has not been validated in all clinical situations. eGFR's persistently <60 mL/min signify possible Chronic Kidney Disease.   Veronica Price gap 01/04/2018 8  3 - 11 Final   Performed at [facility], [place] [address], [place], [place] [address]  . WBC Count 01/04/2018 5.3  3.9 - 10.3 K/uL Final  . RBC 01/04/2018 4.01  3.70 - 5.45 MIL/uL Final  . Hemoglobin 01/04/2018 12.8  11.6 - 15.9 g/dL Final  . HCT 01/04/2018 38.7  34.8 - 46.6 % Final  . MCV 01/04/2018 96.5  79.5 - 101.0 fL Final  . MCH 01/04/2018 31.9  25.1 - 34.0 pg Final    . MCHC 01/04/2018 33.0  31.5 - 36.0 g/dL Final  . RDW 01/04/2018 13.8  11.2 - 14.5 % Final  . Platelet Count 01/04/2018 211  145 - 400 K/uL Final  . Neutrophils Relative % 01/04/2018 63  % Final  . Neutro Abs 01/04/2018 3.3  1.5 - 6.5 K/uL Final  . Lymphocytes Relative 01/04/2018 22  % Final  . Lymphs Abs 01/04/2018 1.2  0.9 - 3.3 K/uL Final  . Monocytes Relative 01/04/2018 12  % Final  . Monocytes Absolute 01/04/2018 0.6  0.1 - 0.9 K/uL Final  . Eosinophils Relative 01/04/2018 2  % Final  . Eosinophils Absolute 01/04/2018 0.1  0.0 - 0.5 K/uL Final  . Basophils Relative 01/04/2018 1  % Final  . Basophils Absolute 01/04/2018 0.0  0.0 - 0.1 K/uL Final   Performed at [facility], [place] [address], [place], [place] [address]    (this displays the last labs from the last 3 days)  No results found for: TOTALPROTELP, ALBUMINELP, A1GS, A2GS, BETS, BETA2SER, GAMS, MSPIKE, SPEI (this displays SPEP labs)  No results found for: KPAFRELGTCHN, LAMBDASER, KAPLAMBRATIO (kappa/lambda light chains)  No results found for: HGBA, HGBA2QUANT, HGBFQUANT, HGBSQUAN (Hemoglobinopathy evaluation)   No results found for: LDH  No results found for: IRON, TIBC, IRONPCTSAT (Iron and TIBC)  No results found for: FERRITIN  Urinalysis No results found for: COLORURINE, APPEARANCEUR, LABSPEC, PHURINE, GLUCOSEU, HGBUR, BILIRUBINUR, KETONESUR, PROTEINUR, UROBILINOGEN, NITRITE, LEUKOCYTESUR   STUDIES: Mr Breast Bilateral W Wo Contrast Inc Cad  Result Date: 12/29/2017 CLINICAL DATA:  Patient with recent diagnosis triple negative right breast cancer. LABS:  Creatinine was obtained on  site at [facility] at [address] Results: Creatinine 0.9 mg/dL. EXAM: BILATERAL BREAST MRI WITH AND WITHOUT CONTRAST TECHNIQUE: Multiplanar, multisequence MR images of both breasts were obtained prior to and following the intravenous administration of 15 ml of MultiHance. THREE-DIMENSIONAL MR  IMAGE RENDERING ON INDEPENDENT WORKSTATION: Three-dimensional MR images were rendered by post-processing of the original MR data on an independent workstation. The three-dimensional MR images were interpreted, and findings are reported in the following complete MRI report for this study. Three dimensional images were evaluated at the independent DynaCad workstation COMPARISON:  Previous exam(s). FINDINGS: Breast composition: b. Scattered fibroglandular tissue. Background parenchymal enhancement: Mild Right breast: Within the upper-outer right breast there is a 3.3 x 2.6 x 2.9 cm solid mass with cystic components, potentially representing associated necrosis or post biopsy changes. The solid component measures 2.4 x 2.2 x 2.2 cm. No additional suspicious areas of enhancement identified within the right breast. Left breast: No mass or abnormal enhancement. Lymph nodes: No abnormal appearing lymph nodes. Ancillary findings:  Moderate hiatal hernia. IMPRESSION: Enhancing mass within the upper-outer right breast, felt to correspond with recently diagnosed malignancy. No additional suspicious areas of enhancement identified within either breast. RECOMMENDATION: No outside biopsy images or clip films were available at the time of MRI interpretation. The initial diagnostic mammogram was available. Recommend confirmation that a biopsy marking clip was placed prior to neoadjuvant chemotherapy. If these films are not able to be obtained, a diagnostic right breast mammogram can be obtained at the breast center prior to administering treatment. Treatment plan for known right breast malignancy. BI-RADS CATEGORY  6: Known biopsy-proven malignancy. Electronically Signed   By: Lovey Newcomer M.D.   On: 12/29/2017 13:38   Dg Chest Port 1 View  Result Date: 12/29/2017 CLINICAL DATA:  Port catheter insertion. EXAM: PORTABLE CHEST 1 VIEW COMPARISON:  None. FINDINGS: Left subclavian port catheter with the tip at the cavoatrial junction.  The heart size and mediastinal contours are within normal limits. Normal pulmonary vascularity. No focal consolidation, pleural effusion, or pneumothorax. No acute osseous abnormality. IMPRESSION: No active disease.  No pneumothorax. Electronically Signed   By: Titus Dubin M.D.   On: 12/29/2017 11:03   Dg Fluoro Guide Cv Line-no Report  Result Date: 12/29/2017 Fluoroscopy was utilized by the requesting physician.  No radiographic interpretation.   [place] Axilla Right  Result Date: 01/01/2018 CLINICAL DATA:  Right axillary ultrasound EXAM: ULTRASOUND OF THE RIGHT BREAST COMPARISON:  Previous exam(s). FINDINGS: On physical exam, no suspicious lumps identified. Targeted ultrasound is performed, showing numerous normal appearing lymph nodes in the right axilla. Additionally, the lymph nodes are normal on the MRI recently performed. Finally, mildly prominent nodes in the right axilla seen mammographically are unchanged since June of 2017 and were noted to be normal with thin cortices and preserved fatty hila on today's ultrasound. IMPRESSION: No axillary adenopathy. RECOMMENDATION: Continued follow-up by oncology and surgery. I have discussed the findings and recommendations with the patient. Results were also provided in writing at the conclusion of the visit. If applicable, a reminder letter will be sent to the patient regarding the next appointment. BI-RADS CATEGORY  2: Benign. Electronically Signed   By: Dorise Bullion III M.D   On: 01/01/2018 15:40   Mm Clip Placement Right  Result Date: 01/01/2018 CLINICAL DATA:  Evaluate for post biopsy marker. EXAM: DIAGNOSTIC RIGHT MAMMOGRAM POST ULTRASOUND BIOPSY COMPARISON:  Previous exam(s). FINDINGS: Mammographic images were obtained following ultrasound guided biopsy of a right breast  mass. A spiral shaped clip is within the superior aspect of the biopsied mass. No clips identified within any visualized lymph nodes. IMPRESSION: Appropriate clip placement within  the superior aspect of the known breast cancer. Final Assessment: Post Procedure Mammograms for Marker Placement Electronically Signed   By: Dorise Bullion III M.D   On: 01/01/2018 15:28    ELIGIBLE FOR AVAILABLE RESEARCH PROTOCOL: Consider SWOG [id number] post-op  ASSESSMENT: 72 y.o. [name], [place] woman status post right breast upper outer quadrant biopsy 11/25/2017 for a clinical T2 N2, stage IIIc invasive ductal carcinoma, grade 3, triple negative, with an MIB-1 of 80%.  (1) staging studies:  (a) chest CT 12/10/2017 scan showed no visceral metastatic disease  (b) bone scan 12/10/2017 showed uptake at T11, with sclerosis.  (c) CA-27-29 on 12/05/2017 was 19.0  (d) spinal MRI in [place] reportedly showed only arthrtis at T11  (2) neoadjuvant chemotherapy consisting of carboplatin/ paclitaxel x 12 starting 01/04/2018, to be followed by cytoxan/ adriamycin x 4  (3) definitive surgery to follow  (4) adjuvant radiation as appropriate   PLAN: Veronica Price did remarkably well with her first cycle of chemotherapy and I am not making any changes in the chemotherapy doses are treatment intervals.  The constipation she experienced is due to the anti-emetics.  I suggested she take stool softeners 2 tablets twice daily and MiraLAX daily in addition to if she wishes prune juice or similar laxatives beginning today, so she does not have the same problem every time  I am going to see her next week just to make sure that she does not have any new symptoms and that she is managing the constipation well.  If so then I will start seeing her every other cycle.  I again offered to set her up with our [facility] but she prefers to come here at least for now  I have asked our chaplain to meet with her today if at all possible  The patient will see me again in a week.  She knows to call for any problems that may develop before then.    Angelita Harnack, [name], MD  01/11/18 9:00 AM Medical Oncology and  Hematology [facility] [address] [place], [place] [address] Tel. [phone]    Fax. [phone]  This document serves as a record of services personally performed by Lurline Del, MD. It was created on his behalf by Steva Colder, a trained medical scribe. The creation of this record is based on the scribe's personal observations and the provider's statements to them.   I have reviewed the above documentation for accuracy and completeness, and I agree with the above.

## 2018-01-11 ENCOUNTER — Telehealth: Payer: Self-pay | Admitting: Oncology

## 2018-01-11 ENCOUNTER — Inpatient Hospital Stay: Payer: Medicare Other

## 2018-01-11 ENCOUNTER — Inpatient Hospital Stay (HOSPITAL_BASED_OUTPATIENT_CLINIC_OR_DEPARTMENT_OTHER): Payer: Medicare Other | Admitting: Oncology

## 2018-01-11 ENCOUNTER — Encounter: Payer: Self-pay | Admitting: General Practice

## 2018-01-11 ENCOUNTER — Encounter: Payer: Self-pay | Admitting: Oncology

## 2018-01-11 ENCOUNTER — Encounter: Payer: Self-pay | Admitting: *Deleted

## 2018-01-11 VITALS — BP 151/87 | HR 70 | Temp 98.2°F | Resp 18 | Ht 62.0 in | Wt 178.6 lb

## 2018-01-11 DIAGNOSIS — C50411 Malignant neoplasm of upper-outer quadrant of right female breast: Secondary | ICD-10-CM

## 2018-01-11 DIAGNOSIS — Z171 Estrogen receptor negative status [ER-]: Secondary | ICD-10-CM

## 2018-01-11 DIAGNOSIS — Z5111 Encounter for antineoplastic chemotherapy: Secondary | ICD-10-CM | POA: Diagnosis not present

## 2018-01-11 LAB — CMP (CANCER CENTER ONLY)
ALBUMIN: 3.7 g/dL (ref 3.5–5.0)
ALK PHOS: 66 U/L (ref 40–150)
ALT: 16 U/L (ref 0–55)
AST: 17 U/L (ref 5–34)
Anion gap: 8 (ref 3–11)
BILIRUBIN TOTAL: 0.6 mg/dL (ref 0.2–1.2)
BUN: 16 mg/dL (ref 7–26)
CALCIUM: 9.2 mg/dL (ref 8.4–10.4)
CO2: 25 mmol/L (ref 22–29)
Chloride: 107 mmol/L (ref 98–109)
Creatinine: 0.8 mg/dL (ref 0.60–1.10)
GFR, Est AFR Am: >60 mL/min (ref >60)
GFR, Estimated: >60 mL/min (ref >60)
GLUCOSE: 94 mg/dL (ref 70–140)
Potassium: 4.2 mmol/L (ref 3.5–5.1)
SODIUM: 140 mmol/L (ref 136–145)
TOTAL PROTEIN: 7.2 g/dL (ref 6.4–8.3)

## 2018-01-11 LAB — CBC WITH DIFFERENTIAL (CANCER CENTER ONLY)
BASOS ABS: 0.1 10*3/uL (ref 0.0–0.1)
BASOS PCT: 1 %
EOS ABS: 0.1 10*3/uL (ref 0.0–0.5)
Eosinophils Relative: 3 %
HEMATOCRIT: 36.4 % (ref 34.8–46.6)
HEMOGLOBIN: 12 g/dL (ref 11.6–15.9)
Lymphocytes Relative: 33 %
Lymphs Abs: 1.3 10*3/uL (ref 0.9–3.3)
MCH: 31.7 pg (ref 25.1–34.0)
MCHC: 33 g/dL (ref 31.5–36.0)
MCV: 96.3 fL (ref 79.5–101.0)
Monocytes Absolute: 0.2 10*3/uL (ref 0.1–0.9)
Monocytes Relative: 5 %
NEUTROS PCT: 58 %
Neutro Abs: 2.3 10*3/uL (ref 1.5–6.5)
Platelet Count: 235 10*3/uL (ref 145–400)
RBC: 3.78 MIL/uL (ref 3.70–5.45)
RDW: 13.1 % (ref 11.2–14.5)
WBC: 4 10*3/uL (ref 3.9–10.3)

## 2018-01-11 MED ORDER — PALONOSETRON HCL INJECTION 0.25 MG/5ML
0.2500 mg | Freq: Once | INTRAVENOUS | Status: AC
  Administered 2018-01-11: 0.25 mg via INTRAVENOUS

## 2018-01-11 MED ORDER — DEXAMETHASONE SODIUM PHOSPHATE 100 MG/10ML IJ SOLN: 10.0000 mg | Freq: Once | INTRAMUSCULAR | Status: DC

## 2018-01-11 MED ORDER — CARBOPLATIN CHEMO INJECTION 450 MG/45ML
179.2000 mg | Freq: Once | INTRAVENOUS | Status: AC
  Administered 2018-01-11: 180 mg via INTRAVENOUS
  Filled 2018-01-11: qty 18

## 2018-01-11 MED ORDER — DEXAMETHASONE SODIUM PHOSPHATE 10 MG/ML IJ SOLN
10.0000 mg | Freq: Once | INTRAMUSCULAR | Status: AC
  Administered 2018-01-11: 10 mg via INTRAVENOUS

## 2018-01-11 MED ORDER — HEPARIN SOD (PORK) LOCK FLUSH 100 UNIT/ML IV SOLN
500.0000 [IU] | Freq: Once | INTRAVENOUS | Status: AC | PRN
Start: 2018-01-11 — End: 2018-01-11
  Administered 2018-01-11: 500 [IU]
  Filled 2018-01-11: qty 5

## 2018-01-11 MED ORDER — DIPHENHYDRAMINE HCL 50 MG/ML IJ SOLN
25.0000 mg | Freq: Once | INTRAMUSCULAR | Status: AC
  Administered 2018-01-11: 25 mg via INTRAVENOUS

## 2018-01-11 MED ORDER — FAMOTIDINE IN NACL 20-0.9 MG/50ML-% IV SOLN
INTRAVENOUS | Status: AC
  Filled 2018-01-11: qty 50

## 2018-01-11 MED ORDER — DEXAMETHASONE SODIUM PHOSPHATE 10 MG/ML IJ SOLN
INTRAMUSCULAR | Status: AC
  Filled 2018-01-11: qty 1

## 2018-01-11 MED ORDER — SODIUM CHLORIDE 0.9% FLUSH
10.0000 mL | INTRAVENOUS | Status: DC | PRN
  Administered 2018-01-11: 10 mL
  Filled 2018-01-11: qty 10

## 2018-01-11 MED ORDER — SODIUM CHLORIDE 0.9 % IV SOLN
80.0000 mg/m2 | Freq: Once | INTRAVENOUS | Status: AC
  Administered 2018-01-11: 150 mg via INTRAVENOUS
  Filled 2018-01-11: qty 25

## 2018-01-11 MED ORDER — DIPHENHYDRAMINE HCL 50 MG/ML IJ SOLN
INTRAMUSCULAR | Status: AC
  Filled 2018-01-11: qty 1

## 2018-01-11 MED ORDER — PALONOSETRON HCL INJECTION 0.25 MG/5ML
INTRAVENOUS | Status: AC
  Filled 2018-01-11: qty 5

## 2018-01-11 MED ORDER — FAMOTIDINE IN NACL 20-0.9 MG/50ML-% IV SOLN
20.0000 mg | Freq: Once | INTRAVENOUS | Status: AC
  Administered 2018-01-11: 20 mg via INTRAVENOUS

## 2018-01-11 MED ORDER — SODIUM CHLORIDE 0.9 % IV SOLN
Freq: Once | INTRAVENOUS | Status: AC
  Administered 2018-01-11: 10:00:00 via INTRAVENOUS

## 2018-01-11 NOTE — Patient Instructions (Signed)
[facility] Discharge Instructions for Patients Receiving Chemotherapy  Today you received the following chemotherapy agents Taxol and Carboplatin  To help prevent nausea and vomiting after your treatment, we encourage you to take your nausea medication as prescribed.  If you develop nausea and vomiting that is not controlled by your nausea medication, call the clinic.   BELOW ARE SYMPTOMS THAT SHOULD BE REPORTED IMMEDIATELY:  *FEVER GREATER THAN 100.5 F  *CHILLS WITH OR WITHOUT FEVER  NAUSEA AND VOMITING THAT IS NOT CONTROLLED WITH YOUR NAUSEA MEDICATION  *UNUSUAL SHORTNESS OF BREATH  *UNUSUAL BRUISING OR BLEEDING  TENDERNESS IN MOUTH AND THROAT WITH OR WITHOUT PRESENCE OF ULCERS  *URINARY PROBLEMS  *BOWEL PROBLEMS  UNUSUAL RASH Items with * indicate a potential emergency and should be followed up as soon as possible.  Feel free to call the clinic should you have any questions or concerns. The clinic phone number is [phone].  Please show the CHEMO ALERT CARD at check-in to the Emergency Department and triage nurse.  Paclitaxel injection (Taxol) What is this medicine? PACLITAXEL (PAK li TAX el) is a chemotherapy drug. It targets fast dividing cells, like cancer cells, and causes these cells to die. This medicine is used to treat ovarian cancer, breast cancer, and other cancers. This medicine may be used for other purposes; ask your health care provider or pharmacist if you have questions. COMMON BRAND NAME(S): Onxol, Taxol What should I tell my health care provider before I take this medicine? They need to know if you have any of these conditions: -blood disorders -irregular heartbeat -infection (especially a virus infection such as chickenpox, cold sores, or herpes) -liver disease -previous or ongoing radiation therapy -an unusual or allergic reaction to paclitaxel, alcohol, polyoxyethylated castor oil, other chemotherapy agents, other medicines,  foods, dyes, or preservatives -pregnant or trying to get pregnant -breast-feeding How should I use this medicine? This drug is given as an infusion into a vein. It is administered in a hospital or clinic by a specially trained health care professional. Talk to your pediatrician regarding the use of this medicine in children. Special care may be needed. Overdosage: If you think you have taken too much of this medicine contact a poison control center or emergency room at once. NOTE: This medicine is only for you. Do not share this medicine with others. What if I miss a dose? It is important not to miss your dose. Call your doctor or health care professional if you are unable to keep an appointment. What may interact with this medicine? Do not take this medicine with any of the following medications: -disulfiram -metronidazole This medicine may also interact with the following medications: -cyclosporine -diazepam -ketoconazole -medicines to increase blood counts like filgrastim, pegfilgrastim, sargramostim -other chemotherapy drugs like cisplatin, doxorubicin, epirubicin, etoposide, teniposide, vincristine -quinidine -testosterone -vaccines -verapamil Talk to your doctor or health care professional before taking any of these medicines: -acetaminophen -aspirin -ibuprofen -ketoprofen -naproxen This list may not describe all possible interactions. Give your health care provider a list of all the medicines, herbs, non-prescription drugs, or dietary supplements you use. Also tell them if you smoke, drink alcohol, or use illegal drugs. Some items may interact with your medicine. What should I watch for while using this medicine? Your condition will be monitored carefully while you are receiving this medicine. You will need important blood work done while you are taking this medicine. This medicine can cause serious allergic reactions. To reduce your risk you will need   to take other  medicine(s) before treatment with this medicine. If you experience allergic reactions like skin rash, itching or hives, swelling of the face, lips, or tongue, tell your doctor or health care professional right away. In some cases, you may be given additional medicines to help with side effects. Follow all directions for their use. This drug may make you feel generally unwell. This is not uncommon, as chemotherapy can affect healthy cells as well as cancer cells. Report any side effects. Continue your course of treatment even though you feel ill unless your doctor tells you to stop. Call your doctor or health care professional for advice if you get a fever, chills or sore throat, or other symptoms of a cold or flu. Do not treat yourself. This drug decreases your body's ability to fight infections. Try to avoid being around people who are sick. This medicine may increase your risk to bruise or bleed. Call your doctor or health care professional if you notice any unusual bleeding. Be careful brushing and flossing your teeth or using a toothpick because you may get an infection or bleed more easily. If you have any dental work done, tell your dentist you are receiving this medicine. Avoid taking products that contain aspirin, acetaminophen, ibuprofen, naproxen, or ketoprofen unless instructed by your doctor. These medicines may hide a fever. Do not become pregnant while taking this medicine. Women should inform their doctor if they wish to become pregnant or think they might be pregnant. There is a potential for serious side effects to an unborn child. Talk to your health care professional or pharmacist for more information. Do not breast-feed an infant while taking this medicine. Men are advised not to father a child while receiving this medicine. This product may contain alcohol. Ask your pharmacist or healthcare provider if this medicine contains alcohol. Be sure to tell all healthcare providers you are  taking this medicine. Certain medicines, like metronidazole and disulfiram, can cause an unpleasant reaction when taken with alcohol. The reaction includes flushing, headache, nausea, vomiting, sweating, and increased thirst. The reaction can last from 30 minutes to several hours. What side effects may I notice from receiving this medicine? Side effects that you should report to your doctor or health care professional as soon as possible: -allergic reactions like skin rash, itching or hives, swelling of the face, lips, or tongue -low blood counts - This drug may decrease the number of white blood cells, red blood cells and platelets. You may be at increased risk for infections and bleeding. -signs of infection - fever or chills, cough, sore throat, pain or difficulty passing urine -signs of decreased platelets or bleeding - bruising, pinpoint red spots on the skin, black, tarry stools, nosebleeds -signs of decreased red blood cells - unusually weak or tired, fainting spells, lightheadedness -breathing problems -chest pain -high or low blood pressure -mouth sores -nausea and vomiting -pain, swelling, redness or irritation at the injection site -pain, tingling, numbness in the hands or feet -slow or irregular heartbeat -swelling of the ankle, feet, hands Side effects that usually do not require medical attention (report to your doctor or health care professional if they continue or are bothersome): -bone pain -complete hair loss including hair on your head, underarms, pubic hair, eyebrows, and eyelashes -changes in the color of fingernails -diarrhea -loosening of the fingernails -loss of appetite -muscle or joint pain -red flush to skin -sweating This list may not describe all possible side effects. Call your doctor for medical advice   about side effects. You may report side effects to FDA at 1-800-FDA-1088. Where should I keep my medicine? This drug is given in a hospital or clinic and  will not be stored at home. NOTE: This sheet is a summary. It may not cover all possible information. If you have questions about this medicine, talk to your doctor, pharmacist, or health care provider.  2018 Elsevier/Gold Standard (2015-07-24 19:58:00)  Carboplatin injection What is this medicine? CARBOPLATIN (KAR boe pla tin) is a chemotherapy drug. It targets fast dividing cells, like cancer cells, and causes these cells to die. This medicine is used to treat ovarian cancer and many other cancers. This medicine may be used for other purposes; ask your health care provider or pharmacist if you have questions. COMMON BRAND NAME(S): Paraplatin What should I tell my health care provider before I take this medicine? They need to know if you have any of these conditions: -blood disorders -hearing problems -kidney disease -recent or ongoing radiation therapy -an unusual or allergic reaction to carboplatin, cisplatin, other chemotherapy, other medicines, foods, dyes, or preservatives -pregnant or trying to get pregnant -breast-feeding How should I use this medicine? This drug is usually given as an infusion into a vein. It is administered in a hospital or clinic by a specially trained health care professional. Talk to your pediatrician regarding the use of this medicine in children. Special care may be needed. Overdosage: If you think you have taken too much of this medicine contact a poison control center or emergency room at once. NOTE: This medicine is only for you. Do not share this medicine with others. What if I miss a dose? It is important not to miss a dose. Call your doctor or health care professional if you are unable to keep an appointment. What may interact with this medicine? -medicines for seizures -medicines to increase blood counts like filgrastim, pegfilgrastim, sargramostim -some antibiotics like amikacin, gentamicin, neomycin, streptomycin, tobramycin -vaccines Talk to  your doctor or health care professional before taking any of these medicines: -acetaminophen -aspirin -ibuprofen -ketoprofen -naproxen This list may not describe all possible interactions. Give your health care provider a list of all the medicines, herbs, non-prescription drugs, or dietary supplements you use. Also tell them if you smoke, drink alcohol, or use illegal drugs. Some items may interact with your medicine. What should I watch for while using this medicine? Your condition will be monitored carefully while you are receiving this medicine. You will need important blood work done while you are taking this medicine. This drug may make you feel generally unwell. This is not uncommon, as chemotherapy can affect healthy cells as well as cancer cells. Report any side effects. Continue your course of treatment even though you feel ill unless your doctor tells you to stop. In some cases, you may be given additional medicines to help with side effects. Follow all directions for their use. Call your doctor or health care professional for advice if you get a fever, chills or sore throat, or other symptoms of a cold or flu. Do not treat yourself. This drug decreases your body's ability to fight infections. Try to avoid being around people who are sick. This medicine may increase your risk to bruise or bleed. Call your doctor or health care professional if you notice any unusual bleeding. Be careful brushing and flossing your teeth or using a toothpick because you may get an infection or bleed more easily. If you have any dental work done, tell your dentist   you are receiving this medicine. Avoid taking products that contain aspirin, acetaminophen, ibuprofen, naproxen, or ketoprofen unless instructed by your doctor. These medicines may hide a fever. Do not become pregnant while taking this medicine. Women should inform their doctor if they wish to become pregnant or think they might be pregnant. There is a  potential for serious side effects to an unborn child. Talk to your health care professional or pharmacist for more information. Do not breast-feed an infant while taking this medicine. What side effects may I notice from receiving this medicine? Side effects that you should report to your doctor or health care professional as soon as possible: -allergic reactions like skin rash, itching or hives, swelling of the face, lips, or tongue -signs of infection - fever or chills, cough, sore throat, pain or difficulty passing urine -signs of decreased platelets or bleeding - bruising, pinpoint red spots on the skin, black, tarry stools, nosebleeds -signs of decreased red blood cells - unusually weak or tired, fainting spells, lightheadedness -breathing problems -changes in hearing -changes in vision -chest pain -high blood pressure -low blood counts - This drug may decrease the number of white blood cells, red blood cells and platelets. You may be at increased risk for infections and bleeding. -nausea and vomiting -pain, swelling, redness or irritation at the injection site -pain, tingling, numbness in the hands or feet -problems with balance, talking, walking -trouble passing urine or change in the amount of urine Side effects that usually do not require medical attention (report to your doctor or health care professional if they continue or are bothersome): -hair loss -loss of appetite -metallic taste in the mouth or changes in taste This list may not describe all possible side effects. Call your doctor for medical advice about side effects. You may report side effects to FDA at 1-800-FDA-1088. Where should I keep my medicine? This drug is given in a hospital or clinic and will not be stored at home. NOTE: This sheet is a summary. It may not cover all possible information. If you have questions about this medicine, talk to your doctor, pharmacist, or health care provider.  2018 Elsevier/Gold  Standard (2007-12-28 14:38:05)  

## 2018-01-11 NOTE — Progress Notes (Signed)
Patient's spouse came in to bring proof of income for [employer].  Patient approved for one-time $1000 grant. Patient signed and has a copy of the approval as well as the expense sheet along with the Outpatient pharmacy information. She received a gas card today from her grant.  She has my card for any additional financial questions or concerns.

## 2018-01-11 NOTE — Telephone Encounter (Signed)
Gave avs and calendar ° °

## 2018-01-11 NOTE — Progress Notes (Signed)
[facility] Note  Visited with Veronica Price in infusion per RN referral. She was welcoming of [place], sharing about current life, growing up, important relationships and memories, and how she is coping with dx/tx so far. She presented as upbeat with few concerns.   Per pt, she had minimal side effects from first chemo and is working out a schedule to stay in [place] with her younger daughter for the first couple days after treatment to avoid driving while already tired and to remain in close proximity to care team in case side effects arise. Today her husband drove her from their home in [place] and was picking up prescriptions during our encounter.   Tashanda has my card and brochure, and knows to call anytime. We plan to f/u when she is back on campus for tx.   [place], [place], [facility] Pager [phone] Voicemail [phone]

## 2018-01-12 ENCOUNTER — Other Ambulatory Visit: Payer: Medicare Other

## 2018-01-12 NOTE — Progress Notes (Signed)
Veronica Price  [phone]) [phone] [phone]     ID: Veronica Price DOB: [birth year]  MR#: [id number]  TSV#:[id number]  Patient Care Team: Veronica Helper, MD as PCP - General Veronica Price, Veronica Dad, MD as Consulting Physician (Oncology) Veronica Price, Veronica Dad, MD as Consulting Physician (Oncology) Veronica Skates, MD as Consulting Physician (General Surgery) OTHER MD:  CHIEF COMPLAINT: Triple negative breast cancer  CURRENT TREATMENT: Neoadjuvant chemotherapy    INTERVAL HISTORY: Veronica Price returns today for follow up and treatment of her triple negative breast cancer, accompanied by her daughter. She is currently undergoing neoadjuvant chemotherapy with weekly paclitaxel/carboplatin, today being day 1 cycle 3, to be followed by cyclophosphamide/doxorubicin in dose dense fashion x4. She tolerates this treatment well so far.   She has numbness in the middle 3 digits of her left foot. Her foot feels better when she walks. Somtimes her hands are numb in the morning or at night, but she thinks this is due to her sleeping pisition. On day 2 after treatment, she feels light headed. She also had some slight tiredness, but she denies significant fatigue. She aids and prevents constipation by taking Colace and Miralax. She denies nausea, vomiting, and diarrhea.   REVIEW OF SYSTEMS: Veronica Price reports that she is doing well. She had some coughing due to allergies recently. She denies unusual headaches, visual changes, or dizziness. There has been no unusual cough, phlegm production, shortness of breath, or pleurisy. This been no change in bowel or bladder habits. She denies unexplained fatigue or unexplained weight loss, bleeding, rash, or fever. A detailed review of systems was otherwise stable.   HISTORY OF CURRENT ILLNESS: Veronica Price initially palpated a mass to her right breast in February 2019.   She brought her to her primary care physician's attention and was set up  for bilateral diagnostic mammography with tomography and right breast ultrasonography on 11/25/2017 showing: a 3.2 x 3.7 cm lobulated mass in the UOQ of the right breast; there was a 3 cm calcified retroareolar mass in the left breast. On right breast ultrasonography there was a complex mostly solid 2.4 x 3.1 cm mass at 10 o'clock and multiple enlarged right axillary lymph nodes, the largest measuring 1.6 x 2.4.  Accordingly on 11/27/2017 the patient proceeded to biopsy of the right breast area in question, as well as an attempted axillary node biopsy. The pathology from this procedure showed ([id number]):  high grade infiltrating ductal carcinoma with no lymphovascular invasion.  The attempted axillary node biopsy showed fat, no nodal tissue.  Prognostic indicators were: estrogen and progesterone receptor negative,. Proliferation marker Ki67 at >80%. HER2 negative by immunohistochemistry at 0%  CT scans of the chest abdomen and pelvis completed on 12/10/2017 showed A complex solid and cystic 3.5 cm right breast mass with several bilateral axillary lymph nodes, the largest in the right axilla measured 12 mm. There was no evidence of visceral metastatic disease.   She had a bone scan on 12/10/2017 that showed two areas of increased uptake overlying the lateral aspects of the T11 vertebrae. Review of the CT scan of the chest revealed sclerosis of the T11 pedicles concerning for bone metastases.  However subsequent spinal MRI (I do not have that report) read this out as arthritis, not a metastatic deposit  The patient had a echocardiogram completed on 3/72019 with results showing: left ventricular ejection fraction of 55-60% with mild concentric hypertrophy. There was mild to moderate mitral and mild tricuspid regurgitation with mild pulmonary  hypertension. The aortic valve was mildly thickened with trace aortic regurgitation.   The patient's subsequent history is as detailed below.  PAST MEDICAL HISTORY: Past  Medical History:  Diagnosis Date  . Arthritis   . Breast cancer ([place])    Right breast  . Cataract    "early" per patient  . GERD (gastroesophageal reflux disease)    occasionaly tums    PAST SURGICAL HISTORY: Past Surgical History:  Procedure Laterality Date  . ABDOMINAL HYSTERECTOMY     Total hysterectomy in 2002  . PORTACATH PLACEMENT N/A 12/29/2017   Procedure: INSERTION PORT-A-CATH LEFT SUBCLAVIAN;  Surgeon: Veronica Skates, MD;  Location: [facility];  Service: General;  Laterality: N/A;    FAMILY HISTORY Family History  Problem Relation Age of Onset  . Diabetes Father   . Heart attack Father    She notes that her father died from a possible MI in his early 14's Her mother is still alive and will be 72 March 2019!  The patient has 2 brothers and 2 sisters. Her brother had prostate cancer possibly agent orange related. She has a cousin who was recently diagnosed with stomach cancer. Her maternal grandmother died from colon cancer. She has a maternal uncle with bone cancer. She denies family hx of breast or ovarian cancer.    GYNECOLOGIC HISTORY:  Menarche: 72 years old Age at first live birth: 72 years old [place] P3 LMP: at age 50 Contraceptive: OCP HRT   Hysterectomy? 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 She also worked at [employer]. She lives at home with her husband, Gwyndolyn Saxon who is a retired Building control surveyor, her husband works part time driving an adult day care bus. Her daughter, [name] is a Multimedia programmer. Her daughter [name] lives in [place], [place] and works as a Therapist, sports for the [place]. Her son Davie Sagona, lives in [place], and works as a Administrator.  The patient has 3 grandchildren and no great-grandchildren. She is of baptist faith.     ADVANCED DIRECTIVES:    HEALTH MAINTENANCE: Social History   Tobacco Use  . Smoking status: Never Smoker  . Smokeless tobacco:  Never Used  Substance Use Topics  . Alcohol use: Never    Frequency: Never  . Drug use: Never     Colonoscopy: UTD; last year (2018)  PAP:  Bone density:    Allergies  Allergen Reactions  . Prednisone Swelling and Other (See Comments)    " I think it was this that made my tongue swell "    Current Outpatient Medications  Medication Sig Dispense Refill  . HYDROcodone-acetaminophen (NORCO) 5-325 MG tablet Take 1-2 tablets by mouth every 6 (six) hours as needed for moderate pain or severe pain. 20 tablet 0  . lidocaine-prilocaine (EMLA) cream Apply 1 application topically as needed. 30 g 0  . lidocaine-prilocaine (EMLA) cream Apply to affected area once 30 g 3  . naproxen sodium (ALEVE) 220 MG tablet Take 220 mg by mouth daily as needed (for pain).    . ondansetron (ZOFRAN) 8 MG tablet Take 1 tablet (8 mg total) by mouth 2 (two) times daily as needed for refractory nausea / vomiting. Start on day 3 after chemo. 30 tablet 1  . prochlorperazine (COMPAZINE) 10 MG tablet Take 1 tablet (10 mg total) by mouth every 6 (six) hours as needed for nausea or vomiting. 30 tablet  0  . prochlorperazine (COMPAZINE) 10 MG tablet Take 1 tablet (10 mg total) by mouth every 6 (six) hours as needed (Nausea or vomiting). 30 tablet 1   No current facility-administered medications for this visit.     OBJECTIVE: Older African-American woman who appears well  Vitals:   01/18/18 0849  BP: 132/85  Pulse: 72  Resp: 18  Temp: 98.5 F (36.9 C)  SpO2: 100%     Body mass index is 32.59 kg/m.   Wt Readings from Last 3 Encounters:  01/18/18 178 lb 3.2 oz (80.8 kg)  01/11/18 178 lb 9.6 oz (81 kg)  01/04/18 177 lb 6.4 oz (80.5 kg)      ECOG FS:1 - Symptomatic but completely ambulatory  Sclerae unicteric, EOMs intact Oropharynx clear and moist No cervical or supraclavicular adenopathy Lungs no rales or rhonchi Heart regular rate and rhythm Abd soft, nontender, positive bowel sounds MSK no focal spinal  tenderness, no upper extremity lymphedema Neuro: nonfocal, well oriented, appropriate affect Breasts: I do not palpate a mass in either breast.  Both axillae are benign.   LAB RESULTS:  CMP     Component Value Date/Time   NA 140 01/11/2018 0747   K 4.2 01/11/2018 0747   CL 107 01/11/2018 0747   CO2 25 01/11/2018 0747   GLUCOSE 94 01/11/2018 0747   BUN 16 01/11/2018 0747   CREATININE 0.80 01/11/2018 0747   CALCIUM 9.2 01/11/2018 0747   PROT 7.2 01/11/2018 0747   ALBUMIN 3.7 01/11/2018 0747   AST 17 01/11/2018 0747   ALT 16 01/11/2018 0747   ALKPHOS 66 01/11/2018 0747   BILITOT 0.6 01/11/2018 0747   GFRNONAA >60 01/11/2018 0747   GFRAA >60 01/11/2018 0747    No results found for: TOTALPROTELP, ALBUMINELP, A1GS, A2GS, BETS, BETA2SER, GAMS, MSPIKE, SPEI  No results found for: KPAFRELGTCHN, LAMBDASER, KAPLAMBRATIO  Lab Results  Component Value Date   WBC 2.5 (L) 01/18/2018   NEUTROABS 1.4 (L) 01/18/2018   HGB 11.7 01/18/2018   HCT 35.4 01/18/2018   MCV 96.7 01/18/2018   PLT 244 01/18/2018    @LASTCHEMISTRY @  No results found for: LABCA2  No components found for: [id number]  No results for input(s): INR in the last 168 hours.  No results found for: LABCA2  No results found for: NFA213  No results found for: YQM578  No results found for: ION629  No results found for: CA2729  No components found for: HGQUANT  No results found for: CEA1 / No results found for: CEA1   No results found for: AFPTUMOR  No results found for: CHROMOGRNA  No results found for: PSA1  Appointment on 01/18/2018  Component Date Value Ref Range Status  . WBC 01/18/2018 2.5* 3.9 - 10.3 K/uL Final  . RBC 01/18/2018 3.66* 3.70 - 5.45 MIL/uL Final  . Hemoglobin 01/18/2018 11.7  11.6 - 15.9 g/dL Final  . HCT 01/18/2018 35.4  34.8 - 46.6 % Final  . MCV 01/18/2018 96.7  79.5 - 101.0 fL Final  . MCH 01/18/2018 32.0  25.1 - 34.0 pg Final  . MCHC 01/18/2018 33.1  31.5 - 36.0 g/dL Final    . RDW 01/18/2018 13.3  11.2 - 14.5 % Final  . Platelets 01/18/2018 244  145 - 400 K/uL Final  . Neutrophils Relative % 01/18/2018 57  % Final  . Neutro Abs 01/18/2018 1.4* 1.5 - 6.5 K/uL Final  . Lymphocytes Relative 01/18/2018 30  % Final  . Lymphs Abs 01/18/2018 0.7* 0.9 -  3.3 K/uL Final  . Monocytes Relative 01/18/2018 11  % Final  . Monocytes Absolute 01/18/2018 0.3  0.1 - 0.9 K/uL Final  . Eosinophils Relative 01/18/2018 1  % Final  . Eosinophils Absolute 01/18/2018 0.0  0.0 - 0.5 K/uL Final  . Basophils Relative 01/18/2018 1  % Final  . Basophils Absolute 01/18/2018 0.0  0.0 - 0.1 K/uL Final   Performed at [facility], [place] [address] [place], [place] [address]    (this displays the last labs from the last 3 days)  No results found for: TOTALPROTELP, ALBUMINELP, A1GS, A2GS, BETS, BETA2SER, GAMS, MSPIKE, SPEI (this displays SPEP labs)  No results found for: KPAFRELGTCHN, LAMBDASER, KAPLAMBRATIO (kappa/lambda light chains)  No results found for: HGBA, HGBA2QUANT, HGBFQUANT, HGBSQUAN (Hemoglobinopathy evaluation)   No results found for: LDH  No results found for: IRON, TIBC, IRONPCTSAT (Iron and TIBC)  No results found for: FERRITIN  Urinalysis No results found for: COLORURINE, APPEARANCEUR, LABSPEC, PHURINE, GLUCOSEU, HGBUR, BILIRUBINUR, KETONESUR, PROTEINUR, UROBILINOGEN, NITRITE, LEUKOCYTESUR   STUDIES: Mr Breast Bilateral W Wo Contrast Inc Cad  Result Date: 12/29/2017 CLINICAL DATA:  Patient with recent diagnosis triple negative right breast cancer. LABS:  Creatinine was obtained on site at [facility] at [address] Results: Creatinine 0.9 mg/dL. EXAM: BILATERAL BREAST MRI WITH AND WITHOUT CONTRAST TECHNIQUE: Multiplanar, multisequence MR images of both breasts were obtained prior to and following the intravenous administration of 15 ml of MultiHance. THREE-DIMENSIONAL MR IMAGE RENDERING ON INDEPENDENT WORKSTATION:  Three-dimensional MR images were rendered by post-processing of the original MR data on an independent workstation. The three-dimensional MR images were interpreted, and findings are reported in the following complete MRI report for this study. Three dimensional images were evaluated at the independent DynaCad workstation COMPARISON:  Previous exam(s). FINDINGS: Breast composition: b. Scattered fibroglandular tissue. Background parenchymal enhancement: Mild Right breast: Within the upper-outer right breast there is a 3.3 x 2.6 x 2.9 cm solid mass with cystic components, potentially representing associated necrosis or post biopsy changes. The solid component measures 2.4 x 2.2 x 2.2 cm. No additional suspicious areas of enhancement identified within the right breast. Left breast: No mass or abnormal enhancement. Lymph nodes: No abnormal appearing lymph nodes. Ancillary findings:  Moderate hiatal hernia. IMPRESSION: Enhancing mass within the upper-outer right breast, felt to correspond with recently diagnosed malignancy. No additional suspicious areas of enhancement identified within either breast. RECOMMENDATION: No outside biopsy images or clip films were available at the time of MRI interpretation. The initial diagnostic mammogram was available. Recommend confirmation that a biopsy marking clip was placed prior to neoadjuvant chemotherapy. If these films are not able to be obtained, a diagnostic right breast mammogram can be obtained at the breast center prior to administering treatment. Treatment plan for known right breast malignancy. BI-RADS CATEGORY  6: Known biopsy-proven malignancy. Electronically Signed   By: Lovey Newcomer M.D.   On: 12/29/2017 13:38   Dg Chest Port 1 View  Result Date: 12/29/2017 CLINICAL DATA:  Port catheter insertion. EXAM: PORTABLE CHEST 1 VIEW COMPARISON:  None. FINDINGS: Left subclavian port catheter with the tip at the cavoatrial junction. The heart size and mediastinal contours are  within normal limits. Normal pulmonary vascularity. No focal consolidation, pleural effusion, or pneumothorax. No acute osseous abnormality. IMPRESSION: No active disease.  No pneumothorax. Electronically Signed   By: Titus Dubin M.D.   On: 12/29/2017 11:03   Dg Fluoro Guide Cv Line-no Report  Result Date: 12/29/2017 Fluoroscopy  was utilized by the requesting physician.  No radiographic interpretation.   [place] Axilla Right  Result Date: 01/01/2018 CLINICAL DATA:  Right axillary ultrasound EXAM: ULTRASOUND OF THE RIGHT BREAST COMPARISON:  Previous exam(s). FINDINGS: On physical exam, no suspicious lumps identified. Targeted ultrasound is performed, showing numerous normal appearing lymph nodes in the right axilla. Additionally, the lymph nodes are normal on the MRI recently performed. Finally, mildly prominent nodes in the right axilla seen mammographically are unchanged since June of 2017 and were noted to be normal with thin cortices and preserved fatty hila on today's ultrasound. IMPRESSION: No axillary adenopathy. RECOMMENDATION: Continued follow-up by oncology and surgery. I have discussed the findings and recommendations with the patient. Results were also provided in writing at the conclusion of the visit. If applicable, a reminder letter will be sent to the patient regarding the next appointment. BI-RADS CATEGORY  2: Benign. Electronically Signed   By: Dorise Bullion III M.D   On: 01/01/2018 15:40   Mm Clip Placement Right  Result Date: 01/01/2018 CLINICAL DATA:  Evaluate for post biopsy marker. EXAM: DIAGNOSTIC RIGHT MAMMOGRAM POST ULTRASOUND BIOPSY COMPARISON:  Previous exam(s). FINDINGS: Mammographic images were obtained following ultrasound guided biopsy of a right breast mass. A spiral shaped clip is within the superior aspect of the biopsied mass. No clips identified within any visualized lymph nodes. IMPRESSION: Appropriate clip placement within the superior aspect of the known breast  cancer. Final Assessment: Post Procedure Mammograms for Marker Placement Electronically Signed   By: Dorise Bullion III M.D   On: 01/01/2018 15:28    ELIGIBLE FOR AVAILABLE RESEARCH PROTOCOL: Consider SWOG [id number] post-op  ASSESSMENT: 72 y.o. [name], [place] woman status post right breast upper outer quadrant biopsy 11/25/2017 for a clinical T2 N2, stage IIIc invasive ductal carcinoma, grade 3, triple negative, with an MIB-1 of 80%.  (1) staging studies:  (a) chest CT 12/10/2017 scan showed no visceral metastatic disease  (b) bone scan 12/10/2017 showed uptake at T11, with sclerosis.  (c) CA-27-29 on 12/05/2017 was 19.0  (d) spinal MRI in [place] reportedly showed only arthrtis at T11  (2) neoadjuvant chemotherapy consisting of carboplatin/ paclitaxel x 12 starting 01/04/2018, to be followed by cytoxan/ adriamycin x 4  (3) definitive surgery to follow  (4) adjuvant radiation as appropriate   PLAN: Javonna is tolerating her chemotherapy remarkably well.  She has not even lost her hair.  We are proceeding to cycle 3 of 12 planned today.  Of course after that she will have her cyclophosphamide and doxorubicin which will be more intense.  I think the numbness she is experiencing in the left foot is not related to the chemotherapy.  It improves with activity.  She has not been stumbling or losing her balance.  She does have some carpal tunnel symptoms.  I suggested she wear a wrist splint at night.  We are proceeding to cycle 3 today.  She will not see me with the next cycle, but she will alert [place] if there is any problem and I will walk back to the treatment area if necessary otherwise she will see me again with cycle 5  She has a good understanding of this plan.  She knows to call for any other issues that may develop before the next visit.  Ramir Malerba, Veronica Dad, MD  01/18/18 9:05 AM Medical Oncology and Hematology [facility] [address] [place], [place] [address] Tel.  [phone]    Fax. [phone]  This document serves as  a record of services personally performed by Lurline Del, MD. It was created on his behalf by Sheron Nightingale, a trained medical scribe. The creation of this record is based on the scribe's personal observations and the provider's statements to them.   I have reviewed the above documentation for accuracy and completeness, and I agree with the above.

## 2018-01-13 ENCOUNTER — Other Ambulatory Visit: Payer: Medicare Other

## 2018-01-13 ENCOUNTER — Ambulatory Visit: Payer: Medicare Other

## 2018-01-18 ENCOUNTER — Inpatient Hospital Stay: Payer: Medicare Other

## 2018-01-18 ENCOUNTER — Telehealth: Payer: Self-pay | Admitting: *Deleted

## 2018-01-18 ENCOUNTER — Inpatient Hospital Stay (HOSPITAL_BASED_OUTPATIENT_CLINIC_OR_DEPARTMENT_OTHER): Payer: Medicare Other | Admitting: Oncology

## 2018-01-18 VITALS — BP 132/85 | HR 72 | Temp 98.5°F | Resp 18 | Ht 62.0 in | Wt 178.2 lb

## 2018-01-18 DIAGNOSIS — C50411 Malignant neoplasm of upper-outer quadrant of right female breast: Secondary | ICD-10-CM

## 2018-01-18 DIAGNOSIS — R200 Anesthesia of skin: Secondary | ICD-10-CM

## 2018-01-18 DIAGNOSIS — Z171 Estrogen receptor negative status [ER-]: Secondary | ICD-10-CM | POA: Diagnosis not present

## 2018-01-18 DIAGNOSIS — Z95828 Presence of other vascular implants and grafts: Secondary | ICD-10-CM

## 2018-01-18 DIAGNOSIS — Z5111 Encounter for antineoplastic chemotherapy: Secondary | ICD-10-CM | POA: Diagnosis not present

## 2018-01-18 LAB — CBC WITH DIFFERENTIAL/PLATELET
BASOS ABS: 0 10*3/uL (ref 0.0–0.1)
Basophils Relative: 1 %
Eosinophils Absolute: 0 10*3/uL (ref 0.0–0.5)
Eosinophils Relative: 1 %
HEMATOCRIT: 35.4 % (ref 34.8–46.6)
Hemoglobin: 11.7 g/dL (ref 11.6–15.9)
LYMPHS PCT: 30 %
Lymphs Abs: 0.7 10*3/uL — ABNORMAL LOW (ref 0.9–3.3)
MCH: 32 pg (ref 25.1–34.0)
MCHC: 33.1 g/dL (ref 31.5–36.0)
MCV: 96.7 fL (ref 79.5–101.0)
Monocytes Absolute: 0.3 10*3/uL (ref 0.1–0.9)
Monocytes Relative: 11 %
NEUTROS ABS: 1.4 10*3/uL — AB (ref 1.5–6.5)
Neutrophils Relative %: 57 %
Platelets: 244 10*3/uL (ref 145–400)
RBC: 3.66 MIL/uL — AB (ref 3.70–5.45)
RDW: 13.3 % (ref 11.2–14.5)
WBC: 2.5 10*3/uL — AB (ref 3.9–10.3)

## 2018-01-18 LAB — COMPREHENSIVE METABOLIC PANEL
ALT: 16 U/L (ref 0–55)
ANION GAP: 8 (ref 3–11)
AST: 15 U/L (ref 5–34)
Albumin: 3.6 g/dL (ref 3.5–5.0)
Alkaline Phosphatase: 64 U/L (ref 40–150)
BUN: 15 mg/dL (ref 7–26)
CHLORIDE: 107 mmol/L (ref 98–109)
CO2: 25 mmol/L (ref 22–29)
Calcium: 9.1 mg/dL (ref 8.4–10.4)
Creatinine, Ser: 0.84 mg/dL (ref 0.60–1.10)
GFR calc Af Amer: >60 mL/min (ref >60)
Glucose, Bld: 92 mg/dL (ref 70–140)
POTASSIUM: 4.3 mmol/L (ref 3.5–5.1)
Sodium: 140 mmol/L (ref 136–145)
Total Bilirubin: 0.4 mg/dL (ref 0.2–1.2)
Total Protein: 7 g/dL (ref 6.4–8.3)

## 2018-01-18 MED ORDER — SODIUM CHLORIDE 0.9% FLUSH
10.0000 mL | Freq: Once | INTRAVENOUS | Status: AC
  Administered 2018-01-18: 10 mL
  Filled 2018-01-18: qty 10

## 2018-01-18 MED ORDER — SODIUM CHLORIDE 0.9% FLUSH
10.0000 mL | INTRAVENOUS | Status: DC | PRN
  Administered 2018-01-18: 10 mL
  Filled 2018-01-18: qty 10

## 2018-01-18 MED ORDER — DEXAMETHASONE SODIUM PHOSPHATE 10 MG/ML IJ SOLN
INTRAMUSCULAR | Status: AC
  Filled 2018-01-18: qty 1

## 2018-01-18 MED ORDER — DEXAMETHASONE SODIUM PHOSPHATE 10 MG/ML IJ SOLN
4.0000 mg | Freq: Once | INTRAMUSCULAR | Status: AC
  Administered 2018-01-18: 4 mg via INTRAVENOUS

## 2018-01-18 MED ORDER — FAMOTIDINE IN NACL 20-0.9 MG/50ML-% IV SOLN
INTRAVENOUS | Status: AC
  Filled 2018-01-18: qty 50

## 2018-01-18 MED ORDER — SODIUM CHLORIDE 0.9 % IV SOLN
Freq: Once | INTRAVENOUS | Status: AC
  Administered 2018-01-18: 10:00:00 via INTRAVENOUS

## 2018-01-18 MED ORDER — SODIUM CHLORIDE 0.9 % IV SOLN
80.0000 mg/m2 | Freq: Once | INTRAVENOUS | Status: AC
  Administered 2018-01-18: 150 mg via INTRAVENOUS
  Filled 2018-01-18: qty 25

## 2018-01-18 MED ORDER — DIPHENHYDRAMINE HCL 50 MG/ML IJ SOLN
25.0000 mg | Freq: Once | INTRAMUSCULAR | Status: AC
  Administered 2018-01-18: 25 mg via INTRAVENOUS

## 2018-01-18 MED ORDER — HEPARIN SOD (PORK) LOCK FLUSH 100 UNIT/ML IV SOLN
500.0000 [IU] | Freq: Once | INTRAVENOUS | Status: AC | PRN
  Administered 2018-01-18: 500 [IU]
  Filled 2018-01-18: qty 5

## 2018-01-18 MED ORDER — DIPHENHYDRAMINE HCL 50 MG/ML IJ SOLN
INTRAMUSCULAR | Status: AC
  Filled 2018-01-18: qty 1

## 2018-01-18 MED ORDER — FAMOTIDINE IN NACL 20-0.9 MG/50ML-% IV SOLN
20.0000 mg | Freq: Once | INTRAVENOUS | Status: AC
  Administered 2018-01-18: 20 mg via INTRAVENOUS

## 2018-01-18 MED ORDER — SODIUM CHLORIDE 0.9 % IV SOLN
179.2000 mg | Freq: Once | INTRAVENOUS | Status: AC
  Administered 2018-01-18: 180 mg via INTRAVENOUS
  Filled 2018-01-18: qty 18

## 2018-01-18 MED ORDER — PALONOSETRON HCL INJECTION 0.25 MG/5ML
0.2500 mg | Freq: Once | INTRAVENOUS | Status: AC
  Administered 2018-01-18: 0.25 mg via INTRAVENOUS

## 2018-01-18 MED ORDER — PALONOSETRON HCL INJECTION 0.25 MG/5ML
INTRAVENOUS | Status: AC
  Filled 2018-01-18: qty 5

## 2018-01-18 NOTE — Telephone Encounter (Signed)
Ok to treat per MD with ANC of 1.4

## 2018-01-18 NOTE — Addendum Note (Signed)
Addended by: Chauncey Cruel on: 01/18/2018 09:41 AM   Modules accepted: Orders

## 2018-01-18 NOTE — Patient Instructions (Signed)
[facility] Discharge Instructions for Patients Receiving Chemotherapy  Today you received the following chemotherapy agents: Carboplatin and Taxol  To help prevent nausea and vomiting after your treatment, we encourage you to take your nausea medication as directed   If you develop nausea and vomiting that is not controlled by your nausea medication, call the clinic.   BELOW ARE SYMPTOMS THAT SHOULD BE REPORTED IMMEDIATELY:  *FEVER GREATER THAN 100.5 F  *CHILLS WITH OR WITHOUT FEVER  NAUSEA AND VOMITING THAT IS NOT CONTROLLED WITH YOUR NAUSEA MEDICATION  *UNUSUAL SHORTNESS OF BREATH  *UNUSUAL BRUISING OR BLEEDING  TENDERNESS IN MOUTH AND THROAT WITH OR WITHOUT PRESENCE OF ULCERS  *URINARY PROBLEMS  *BOWEL PROBLEMS  UNUSUAL RASH Items with * indicate a potential emergency and should be followed up as soon as possible.  Feel free to call the clinic should you have any questions or concerns. The clinic phone number is [phone].  Please show the CHEMO ALERT CARD at check-in to the Emergency Department and triage nurse.   

## 2018-01-18 NOTE — Progress Notes (Signed)
Per Dr. Jana Hakim, it is ok to treat with ANC of 1.4

## 2018-01-19 ENCOUNTER — Ambulatory Visit: Payer: Medicare Other

## 2018-01-19 ENCOUNTER — Other Ambulatory Visit: Payer: Medicare Other

## 2018-01-19 ENCOUNTER — Telehealth: Payer: Self-pay | Admitting: Oncology

## 2018-01-19 NOTE — Telephone Encounter (Signed)
Per 4/15 no los 

## 2018-01-25 ENCOUNTER — Encounter: Payer: Self-pay | Admitting: General Practice

## 2018-01-25 ENCOUNTER — Inpatient Hospital Stay: Payer: Medicare Other

## 2018-01-25 VITALS — BP 158/75 | HR 64 | Temp 98.3°F | Resp 19 | Wt 178.5 lb

## 2018-01-25 DIAGNOSIS — C50411 Malignant neoplasm of upper-outer quadrant of right female breast: Secondary | ICD-10-CM | POA: Diagnosis not present

## 2018-01-25 DIAGNOSIS — Z171 Estrogen receptor negative status [ER-]: Principal | ICD-10-CM

## 2018-01-25 DIAGNOSIS — Z5111 Encounter for antineoplastic chemotherapy: Secondary | ICD-10-CM | POA: Diagnosis not present

## 2018-01-25 LAB — CBC WITH DIFFERENTIAL/PLATELET
BASOS PCT: 0 %
Basophils Absolute: 0 10*3/uL (ref 0.0–0.1)
EOS ABS: 0 10*3/uL (ref 0.0–0.5)
EOS PCT: 1 %
HCT: 34.7 % — ABNORMAL LOW (ref 34.8–46.6)
Hemoglobin: 11.4 g/dL — ABNORMAL LOW (ref 11.6–15.9)
LYMPHS ABS: 1 10*3/uL (ref 0.9–3.3)
Lymphocytes Relative: 36 %
MCH: 31.8 pg (ref 25.1–34.0)
MCHC: 32.9 g/dL (ref 31.5–36.0)
MCV: 96.9 fL (ref 79.5–101.0)
MONOS PCT: 7 %
Monocytes Absolute: 0.2 10*3/uL (ref 0.1–0.9)
Neutro Abs: 1.5 10*3/uL (ref 1.5–6.5)
Neutrophils Relative %: 56 %
Platelets: 250 10*3/uL (ref 145–400)
RBC: 3.58 MIL/uL — ABNORMAL LOW (ref 3.70–5.45)
RDW: 13.5 % (ref 11.2–14.5)
WBC: 2.8 10*3/uL — ABNORMAL LOW (ref 3.9–10.3)

## 2018-01-25 LAB — COMPREHENSIVE METABOLIC PANEL
ALK PHOS: 56 U/L (ref 40–150)
ALT: 15 U/L (ref 0–55)
AST: 17 U/L (ref 5–34)
Albumin: 3.9 g/dL (ref 3.5–5.0)
Anion gap: 8 (ref 3–11)
BUN: 10 mg/dL (ref 7–26)
CALCIUM: 9.1 mg/dL (ref 8.4–10.4)
CO2: 23 mmol/L (ref 22–29)
Chloride: 108 mmol/L (ref 98–109)
Creatinine, Ser: 0.77 mg/dL (ref 0.60–1.10)
GFR calc non Af Amer: >60 mL/min (ref >60)
GLUCOSE: 86 mg/dL (ref 70–140)
Potassium: 4.2 mmol/L (ref 3.5–5.1)
SODIUM: 139 mmol/L (ref 136–145)
Total Bilirubin: 0.4 mg/dL (ref 0.2–1.2)
Total Protein: 7.1 g/dL (ref 6.4–8.3)

## 2018-01-25 MED ORDER — HEPARIN SOD (PORK) LOCK FLUSH 100 UNIT/ML IV SOLN
500.0000 [IU] | Freq: Once | INTRAVENOUS | Status: AC | PRN
  Administered 2018-01-25: 500 [IU]
  Filled 2018-01-25: qty 5

## 2018-01-25 MED ORDER — PALONOSETRON HCL INJECTION 0.25 MG/5ML
0.2500 mg | Freq: Once | INTRAVENOUS | Status: AC
  Administered 2018-01-25: 0.25 mg via INTRAVENOUS

## 2018-01-25 MED ORDER — FAMOTIDINE IN NACL 20-0.9 MG/50ML-% IV SOLN
20.0000 mg | Freq: Once | INTRAVENOUS | Status: AC
  Administered 2018-01-25: 20 mg via INTRAVENOUS

## 2018-01-25 MED ORDER — SODIUM CHLORIDE 0.9 % IV SOLN
179.2000 mg | Freq: Once | INTRAVENOUS | Status: AC
  Administered 2018-01-25: 180 mg via INTRAVENOUS
  Filled 2018-01-25: qty 18

## 2018-01-25 MED ORDER — DIPHENHYDRAMINE HCL 50 MG/ML IJ SOLN
25.0000 mg | Freq: Once | INTRAMUSCULAR | Status: AC
  Administered 2018-01-25: 25 mg via INTRAVENOUS

## 2018-01-25 MED ORDER — DEXAMETHASONE SODIUM PHOSPHATE 10 MG/ML IJ SOLN
4.0000 mg | Freq: Once | INTRAMUSCULAR | Status: AC
  Administered 2018-01-25: 4 mg via INTRAVENOUS

## 2018-01-25 MED ORDER — PALONOSETRON HCL INJECTION 0.25 MG/5ML
INTRAVENOUS | Status: AC
  Filled 2018-01-25: qty 5

## 2018-01-25 MED ORDER — DEXAMETHASONE SODIUM PHOSPHATE 10 MG/ML IJ SOLN
INTRAMUSCULAR | Status: AC
  Filled 2018-01-25: qty 1

## 2018-01-25 MED ORDER — FAMOTIDINE IN NACL 20-0.9 MG/50ML-% IV SOLN
INTRAVENOUS | Status: AC
  Filled 2018-01-25: qty 50

## 2018-01-25 MED ORDER — SODIUM CHLORIDE 0.9 % IV SOLN
80.0000 mg/m2 | Freq: Once | INTRAVENOUS | Status: AC
  Administered 2018-01-25: 150 mg via INTRAVENOUS
  Filled 2018-01-25: qty 25

## 2018-01-25 MED ORDER — SODIUM CHLORIDE 0.9% FLUSH
10.0000 mL | INTRAVENOUS | Status: DC | PRN
  Administered 2018-01-25: 10 mL
  Filled 2018-01-25: qty 10

## 2018-01-25 MED ORDER — DIPHENHYDRAMINE HCL 50 MG/ML IJ SOLN
INTRAMUSCULAR | Status: AC
  Filled 2018-01-25: qty 1

## 2018-01-25 MED ORDER — SODIUM CHLORIDE 0.9 % IV SOLN
Freq: Once | INTRAVENOUS | Status: AC
  Administered 2018-01-25: 10:00:00 via INTRAVENOUS

## 2018-01-25 NOTE — Patient Instructions (Addendum)
[facility] °Discharge Instructions for Patients Receiving Chemotherapy ° °Today you received the following chemotherapy agents Taxol; Carboplatin ° °To help prevent nausea and vomiting after your treatment, we encourage you to take your nausea medication as directed °  °If you develop nausea and vomiting that is not controlled by your nausea medication, call the clinic.  ° °BELOW ARE SYMPTOMS THAT SHOULD BE REPORTED IMMEDIATELY: °· *FEVER GREATER THAN 100.5 F °· *CHILLS WITH OR WITHOUT FEVER °· NAUSEA AND VOMITING THAT IS NOT CONTROLLED WITH YOUR NAUSEA MEDICATION °· *UNUSUAL SHORTNESS OF BREATH °· *UNUSUAL BRUISING OR BLEEDING °· TENDERNESS IN MOUTH AND THROAT WITH OR WITHOUT PRESENCE OF ULCERS °· *URINARY PROBLEMS °· *BOWEL PROBLEMS °· UNUSUAL RASH °Items with * indicate a potential emergency and should be followed up as soon as possible. ° °Feel free to call the clinic should you have any questions or concerns. The clinic phone number is [phone]. ° °Please show the CHEMO ALERT CARD at check-in to the Emergency Department and triage nurse. ° ° °

## 2018-01-25 NOTE — Progress Notes (Signed)
[facility] Spiritual Care Note  Missed Veronica Price in infusion, so mailed her a handwritten note of encouragement. Enclosed my card with encouragement to call anytime.   [place], [place], [facility] Pager [phone] Voicemail [phone]

## 2018-01-26 ENCOUNTER — Other Ambulatory Visit: Payer: Medicare Other

## 2018-01-26 ENCOUNTER — Ambulatory Visit: Payer: Medicare Other

## 2018-01-29 NOTE — Progress Notes (Signed)
[place]  [phone] [phone]     ID: Veronica Price DOB: [birth year]  MR#: [id number]  SKA#:[id number]  Patient Care Team: [name], MD as PCP - General Keondrick Dilks, [name], MD as Consulting Physician (Oncology) Dashonda Bonneau, [name], MD as Consulting Physician (Oncology) [name], MD as Consulting Physician (General Surgery) OTHER MD:  CHIEF COMPLAINT: Triple negative breast cancer  CURRENT TREATMENT: Neoadjuvant chemotherapy    INTERVAL HISTORY: Veronica Price returns today for follow up and treatment of her triple negative breast cancer, accompanied by her daughter.  She is currently undergoing neoadjuvant chemotherapy with weekly paclitaxel/carboplatin, to be followed by cyclophosphamide/doxorubicin in dose dense fashion x4. Today is day 1 cycle 5, She denies nausea, oral sores. Food taste good so far. She is having constipation, she takes her medications to aid this. She feels coldness and numbness in her left foot. She constantly drinks fluids.    REVIEW OF SYSTEMS: Makella reports that she is not feeling good today. She had to wait a long time to check in from treatment. She denies unusual headaches, visual changes, nausea, vomiting, or dizziness. There has been no unusual cough, phlegm production, or pleurisy. This been no change in bowel or bladder habits. She denies unexplained fatigue or unexplained weight loss, bleeding, rash, or fever. A detailed review of systems was otherwise stable.     HISTORY OF CURRENT ILLNESS: [name] initially palpated a mass to her right breast in February 2019.   She brought her to her primary care physician's attention and was set up for bilateral diagnostic mammography with tomography and right breast ultrasonography on 11/25/2017 showing: a 3.2 x 3.7 cm lobulated mass in the UOQ of the right breast; there was a 3 cm calcified retroareolar mass in the left breast. On right breast  ultrasonography there was a complex mostly solid 2.4 x 3.1 cm mass at 10 o'clock and multiple enlarged right axillary lymph nodes, the largest measuring 1.6 x 2.4.  Accordingly on 11/27/2017 the patient proceeded to biopsy of the right breast area in question, as well as an attempted axillary node biopsy. The pathology from this procedure showed ([id number]):  high grade infiltrating ductal carcinoma with no lymphovascular invasion.  The attempted axillary node biopsy showed fat, no nodal tissue.  Prognostic indicators were: estrogen and progesterone receptor negative,. Proliferation marker Ki67 at >80%. HER2 negative by immunohistochemistry at 0%  CT scans of the chest abdomen and pelvis completed on 12/10/2017 showed A complex solid and cystic 3.5 cm right breast mass with several bilateral axillary lymph nodes, the largest in the right axilla measured 12 mm. There was no evidence of visceral metastatic disease.   She had a bone scan on 12/10/2017 that showed two areas of increased uptake overlying the lateral aspects of the T11 vertebrae. Review of the CT scan of the chest revealed sclerosis of the T11 pedicles concerning for bone metastases.  However subsequent spinal MRI (I do not have that report) read this out as arthritis, not a metastatic deposit  The patient had a echocardiogram completed on 3/72019 with results showing: left ventricular ejection fraction of 55-60% with mild concentric hypertrophy. There was mild to moderate mitral and mild tricuspid regurgitation with mild pulmonary hypertension. The aortic valve was mildly thickened with trace aortic regurgitation.   The patient's subsequent history is as detailed below.  PAST MEDICAL HISTORY: Past Medical History:  Diagnosis Date  . Arthritis   . Breast cancer ([place])  Right breast  . Cataract    "early" per patient  . GERD (gastroesophageal reflux disease)    occasionaly tums    PAST SURGICAL HISTORY: Past Surgical History:    Procedure Laterality Date  . ABDOMINAL HYSTERECTOMY     Total hysterectomy in 2002  . PORTACATH PLACEMENT N/A 12/29/2017   Procedure: INSERTION PORT-A-CATH LEFT SUBCLAVIAN;  Surgeon: [name], MD;  Location: [facility];  Service: General;  Laterality: N/A;    FAMILY HISTORY Family History  Problem Relation Age of Onset  . Diabetes Father   . Heart attack Father    She notes that her father died from a possible MI in his early 60's Her mother is still alive and will be 11 December 2017!  The patient has 2 brothers and 2 sisters. Her brother had prostate cancer possibly agent orange related. She has a cousin who was recently diagnosed with stomach cancer. Her maternal grandmother died from colon cancer. She has a maternal uncle with bone cancer. She denies family hx of breast or ovarian cancer.    GYNECOLOGIC HISTORY:  Menarche: 72 years old Age at first live birth: 72 years old [place] P3 LMP: at age 66 Contraceptive: OCP HRT   Hysterectomy? 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 She also worked at [employer]. She lives at home with her husband, Gwyndolyn Saxon who is a retired Building control surveyor, her husband works part time driving an adult day care bus. Her daughter, [name] is a Multimedia programmer. Her daughter [name] lives in [place], [place] and works as a Therapist, sports for the [place]. Her son Ziare Orrick, lives in [place], and works as a Administrator.  The patient has 3 grandchildren and no great-grandchildren. She is of baptist faith.     ADVANCED DIRECTIVES:    HEALTH MAINTENANCE: Social History   Tobacco Use  . Smoking status: Never Smoker  . Smokeless tobacco: Never Used  Substance Use Topics  . Alcohol use: Never    Frequency: Never  . Drug use: Never     Colonoscopy: UTD; last year (2018)  PAP:  Bone density:    Allergies  Allergen Reactions  . Prednisone Swelling and Other (See  Comments)    " I think it was this that made my tongue swell "    Current Outpatient Medications  Medication Sig Dispense Refill  . HYDROcodone-acetaminophen (NORCO) 5-325 MG tablet Take 1-2 tablets by mouth every 6 (six) hours as needed for moderate pain or severe pain. 20 tablet 0  . lidocaine-prilocaine (EMLA) cream Apply 1 application topically as needed. 30 g 0  . lidocaine-prilocaine (EMLA) cream Apply to affected area once 30 g 3  . naproxen sodium (ALEVE) 220 MG tablet Take 220 mg by mouth daily as needed (for pain).    . ondansetron (ZOFRAN) 8 MG tablet Take 1 tablet (8 mg total) by mouth 2 (two) times daily as needed for refractory nausea / vomiting. Start on day 3 after chemo. 30 tablet 1  . prochlorperazine (COMPAZINE) 10 MG tablet Take 1 tablet (10 mg total) by mouth every 6 (six) hours as needed for nausea or vomiting. 30 tablet 0  . prochlorperazine (COMPAZINE) 10 MG tablet Take 1 tablet (10 mg total) by mouth every 6 (six) hours as needed (Nausea or vomiting). 30 tablet 1   No current facility-administered medications for this visit.    Facility-Administered Medications  Ordered in Other Visits  Medication Dose Route Frequency Provider Last Rate Last Dose  . CARBOplatin (PARAPLATIN) 180 mg in sodium chloride 0.9 % 250 mL chemo infusion  180 mg Intravenous Once Huck Ashworth, [name], MD 536 mL/hr at 02/01/18 1550 180 mg at 02/01/18 1550  . heparin lock flush 100 unit/mL  500 Units Intracatheter Once PRN Denyse Fillion, [name], MD      . sodium chloride flush (NS) 0.9 % injection 10 mL  10 mL Intracatheter PRN Aanika Defoor, [name], MD        OBJECTIVE: Older African-American woman examined in the treatment area in a recliner  There were no vitals filed for this visit.   There is no height or weight on file to calculate BMI.   Wt Readings from Last 3 Encounters:  01/25/18 178 lb 8 oz (81 kg)  01/18/18 178 lb 3.2 oz (80.8 kg)  01/11/18 178 lb 9.6 oz (81 kg)      ECOG FS:1 -  Symptomatic but completely ambulatory  Sclerae unicteric, pupils round and equal No cervical or supraclavicular adenopathy Lungs no rales or rhonchi Heart regular rate and rhythm Abd soft, nontender, positive bowel sounds Neuro: nonfocal, well oriented, appropriate affect; dorsiflexion bilaterally and with special attention to the left ankle 5/5 Breasts: Deferred  LAB RESULTS:  CMP     Component Value Date/Time   NA 138 02/01/2018 1153   K 4.4 02/01/2018 1153   CL 105 02/01/2018 1153   CO2 26 02/01/2018 1153   GLUCOSE 75 02/01/2018 1153   BUN 14 02/01/2018 1153   CREATININE 0.81 02/01/2018 1153   CREATININE 0.80 01/11/2018 0747   CALCIUM 9.9 02/01/2018 1153   PROT 7.7 02/01/2018 1153   ALBUMIN 4.1 02/01/2018 1153   AST 18 02/01/2018 1153   AST 17 01/11/2018 0747   ALT 20 02/01/2018 1153   ALT 16 01/11/2018 0747   ALKPHOS 66 02/01/2018 1153   BILITOT 0.3 02/01/2018 1153   BILITOT 0.6 01/11/2018 0747   GFRNONAA >60 02/01/2018 1153   GFRNONAA >60 01/11/2018 0747   GFRAA >60 02/01/2018 1153   GFRAA >60 01/11/2018 0747    No results found for: TOTALPROTELP, ALBUMINELP, A1GS, A2GS, BETS, BETA2SER, GAMS, MSPIKE, SPEI  No results found for: KPAFRELGTCHN, LAMBDASER, KAPLAMBRATIO  Lab Results  Component Value Date   WBC 3.2 (L) 02/01/2018   NEUTROABS 1.4 (L) 02/01/2018   HGB 12.2 02/01/2018   HCT 37.2 02/01/2018   MCV 96.9 02/01/2018   PLT 212 02/01/2018    _0 @  No results found for: LABCA2  No components found for: BWLSLH734  No results for input(s): INR in the last 168 hours.  No results found for: LABCA2  No results found for: KAJ681  No results found for: LXB262  No results found for: MBT597  No results found for: CA2729  No components found for: HGQUANT  No results found for: CEA1 / No results found for: CEA1   No results found for: AFPTUMOR  No results found for: CHROMOGRNA  No results found for: PSA1  Appointment on 02/01/2018    Component Date Value Ref Range Status  . Sodium 02/01/2018 138  136 - 145 mmol/L Final  . Potassium 02/01/2018 4.4  3.5 - 5.1 mmol/L Final  . Chloride 02/01/2018 105  98 - 109 mmol/L Final  . CO2 02/01/2018 26  22 - 29 mmol/L Final  . Glucose, Bld 02/01/2018 75  70 - 140 mg/dL Final  . BUN 02/01/2018 14  7 -  26 mg/dL Final  . Creatinine, Ser 02/01/2018 0.81  0.60 - 1.10 mg/dL Final  . Calcium 02/01/2018 9.9  8.4 - 10.4 mg/dL Final  . Total Protein 02/01/2018 7.7  6.4 - 8.3 g/dL Final  . Albumin 02/01/2018 4.1  3.5 - 5.0 g/dL Final  . AST 02/01/2018 18  5 - 34 U/L Final  . ALT 02/01/2018 20  0 - 55 U/L Final  . Alkaline Phosphatase 02/01/2018 66  40 - 150 U/L Final  . Total Bilirubin 02/01/2018 0.3  0.2 - 1.2 mg/dL Final  . GFR calc non Af Amer 02/01/2018 >60  >60 mL/min Final  . GFR calc Af Amer 02/01/2018 >60  >60 mL/min Final   Comment: (NOTE) The eGFR has been calculated using the CKD EPI equation. This calculation has not been validated in all clinical situations. eGFR's persistently <60 mL/min signify possible Chronic Kidney Disease.   Georgiann Hahn gap 02/01/2018 7  3 - 11 Final   Performed at [facility], [place] [address] [place], [place] [address]  . WBC 02/01/2018 3.2* 3.9 - 10.3 K/uL Final  . RBC 02/01/2018 3.84  3.70 - 5.45 MIL/uL Final  . Hemoglobin 02/01/2018 12.2  11.6 - 15.9 g/dL Final  . HCT 02/01/2018 37.2  34.8 - 46.6 % Final  . MCV 02/01/2018 96.9  79.5 - 101.0 fL Final  . MCH 02/01/2018 31.8  25.1 - 34.0 pg Final  . MCHC 02/01/2018 32.8  31.5 - 36.0 g/dL Final  . RDW 02/01/2018 13.9  11.2 - 14.5 % Final  . Platelets 02/01/2018 212  145 - 400 K/uL Final  . Neutrophils Relative % 02/01/2018 46  % Final  . Neutro Abs 02/01/2018 1.4* 1.5 - 6.5 K/uL Final  . Lymphocytes Relative 02/01/2018 41  % Final  . Lymphs Abs 02/01/2018 1.3  0.9 - 3.3 K/uL Final  . Monocytes Relative 02/01/2018 11  % Final  . Monocytes Absolute 02/01/2018 0.4  0.1 - 0.9  K/uL Final  . Eosinophils Relative 02/01/2018 1  % Final  . Eosinophils Absolute 02/01/2018 0.0  0.0 - 0.5 K/uL Final  . Basophils Relative 02/01/2018 1  % Final  . Basophils Absolute 02/01/2018 0.0  0.0 - 0.1 K/uL Final   Performed at [facility], [place] [address], [place], [place] [address]    (this displays the last labs from the last 3 days)  No results found for: TOTALPROTELP, ALBUMINELP, A1GS, A2GS, BETS, BETA2SER, GAMS, MSPIKE, SPEI (this displays SPEP labs)  No results found for: KPAFRELGTCHN, LAMBDASER, KAPLAMBRATIO (kappa/lambda light chains)  No results found for: HGBA, HGBA2QUANT, HGBFQUANT, HGBSQUAN (Hemoglobinopathy evaluation)   No results found for: LDH  No results found for: IRON, TIBC, IRONPCTSAT (Iron and TIBC)  No results found for: FERRITIN  Urinalysis No results found for: COLORURINE, APPEARANCEUR, LABSPEC, PHURINE, GLUCOSEU, HGBUR, BILIRUBINUR, KETONESUR, PROTEINUR, UROBILINOGEN, NITRITE, LEUKOCYTESUR   STUDIES: No results found.  ELIGIBLE FOR AVAILABLE RESEARCH PROTOCOL: Consider SWOG [id number] post-op  ASSESSMENT: 72 y.o. [name], [place] woman status post right breast upper outer quadrant biopsy 11/25/2017 for a clinical T2 N2, stage IIIc invasive ductal carcinoma, grade 3, triple negative, with an MIB-1 of 80%.  (1) staging studies:  (a) chest CT 12/10/2017 scan showed no visceral metastatic disease  (b) bone scan 12/10/2017 showed uptake at T11, with sclerosis.  (c) CA-27-29 on 12/05/2017 was 19.0  (d) spinal MRI in [place] reportedly showed only arthrtis at T11  (2) neoadjuvant chemotherapy consisting of carboplatin/ paclitaxel  x 12 starting 01/04/2018, to be followed by cytoxan/ adriamycin x 4  (3) definitive surgery to follow  (4) adjuvant radiation as appropriate   PLAN: Linah is doing remarkably well so far with her carboplatin/paclitaxel treatments.  Of course she has lost her here.  Some of her counts are trending  down some, but she is not symptomatic from this and we did not have to hold her treatment today.  She complains of "numbness" in her left foot, particularly the middle 3 toes.  She is not aware of any history of trauma or surgery to that foot.  There is no symptoms of neuropathy elsewhere.  Motor there is 5/5.  Accordingly we are going to continue treatment as before.  She is not seeing [place] next week although she does have a treatment and I have asked her and her daughter to alert [place] if any new symptoms suggestive of neuropathy develop.  Otherwise she will see [place] again in 2 weeks.  Once she completes her carboplatin/paclitaxel treatment we will consider going to cyclophosphamide and doxorubicin, likely decreased doses at least for the first cycle, and possibly to be given every 3 weeks instead of every 2 weeks.  They know to call for any other issues that may develop before her next visit.  Tylea Hise, [name], MD  02/01/18 4:17 PM Medical Oncology and Hematology [facility] [address] [place], [place] [address] Tel. [phone]    Fax. [phone]  This document serves as a record of services personally performed by Lurline Del, MD. It was created on his behalf by Sheron Nightingale, a trained medical scribe. The creation of this record is based on the scribe's personal observations and the provider's statements to them.   I have reviewed the above documentation for accuracy and completeness, and I agree with the above.

## 2018-02-01 ENCOUNTER — Inpatient Hospital Stay: Payer: Medicare Other

## 2018-02-01 ENCOUNTER — Inpatient Hospital Stay (HOSPITAL_BASED_OUTPATIENT_CLINIC_OR_DEPARTMENT_OTHER): Payer: Medicare Other | Admitting: Oncology

## 2018-02-01 ENCOUNTER — Telehealth: Payer: Self-pay | Admitting: *Deleted

## 2018-02-01 VITALS — BP 157/80 | HR 71 | Temp 98.2°F | Resp 18

## 2018-02-01 DIAGNOSIS — Z171 Estrogen receptor negative status [ER-]: Secondary | ICD-10-CM | POA: Diagnosis not present

## 2018-02-01 DIAGNOSIS — R200 Anesthesia of skin: Secondary | ICD-10-CM

## 2018-02-01 DIAGNOSIS — C50411 Malignant neoplasm of upper-outer quadrant of right female breast: Secondary | ICD-10-CM

## 2018-02-01 DIAGNOSIS — Z5111 Encounter for antineoplastic chemotherapy: Secondary | ICD-10-CM | POA: Diagnosis not present

## 2018-02-01 LAB — COMPREHENSIVE METABOLIC PANEL
ALT: 20 U/L (ref 0–55)
AST: 18 U/L (ref 5–34)
Albumin: 4.1 g/dL (ref 3.5–5.0)
Alkaline Phosphatase: 66 U/L (ref 40–150)
Anion gap: 7 (ref 3–11)
BUN: 14 mg/dL (ref 7–26)
CHLORIDE: 105 mmol/L (ref 98–109)
CO2: 26 mmol/L (ref 22–29)
CREATININE: 0.81 mg/dL (ref 0.60–1.10)
Calcium: 9.9 mg/dL (ref 8.4–10.4)
Glucose, Bld: 75 mg/dL (ref 70–140)
POTASSIUM: 4.4 mmol/L (ref 3.5–5.1)
Sodium: 138 mmol/L (ref 136–145)
Total Bilirubin: 0.3 mg/dL (ref 0.2–1.2)
Total Protein: 7.7 g/dL (ref 6.4–8.3)

## 2018-02-01 LAB — CBC WITH DIFFERENTIAL/PLATELET
Basophils Absolute: 0 10*3/uL (ref 0.0–0.1)
Basophils Relative: 1 %
EOS ABS: 0 10*3/uL (ref 0.0–0.5)
Eosinophils Relative: 1 %
HCT: 37.2 % (ref 34.8–46.6)
HEMOGLOBIN: 12.2 g/dL (ref 11.6–15.9)
LYMPHS ABS: 1.3 10*3/uL (ref 0.9–3.3)
LYMPHS PCT: 41 %
MCH: 31.8 pg (ref 25.1–34.0)
MCHC: 32.8 g/dL (ref 31.5–36.0)
MCV: 96.9 fL (ref 79.5–101.0)
Monocytes Absolute: 0.4 10*3/uL (ref 0.1–0.9)
Monocytes Relative: 11 %
NEUTROS PCT: 46 %
Neutro Abs: 1.4 10*3/uL — ABNORMAL LOW (ref 1.5–6.5)
Platelets: 212 10*3/uL (ref 145–400)
RBC: 3.84 MIL/uL (ref 3.70–5.45)
RDW: 13.9 % (ref 11.2–14.5)
WBC: 3.2 10*3/uL — AB (ref 3.9–10.3)

## 2018-02-01 MED ORDER — SODIUM CHLORIDE 0.9 % IV SOLN
Freq: Once | INTRAVENOUS | Status: AC
  Administered 2018-02-01: 13:00:00 via INTRAVENOUS

## 2018-02-01 MED ORDER — FAMOTIDINE IN NACL 20-0.9 MG/50ML-% IV SOLN
20.0000 mg | Freq: Once | INTRAVENOUS | Status: AC
  Administered 2018-02-01: 20 mg via INTRAVENOUS

## 2018-02-01 MED ORDER — DIPHENHYDRAMINE HCL 50 MG/ML IJ SOLN
25.0000 mg | Freq: Once | INTRAMUSCULAR | Status: AC
  Administered 2018-02-01: 25 mg via INTRAVENOUS

## 2018-02-01 MED ORDER — SODIUM CHLORIDE 0.9 % IV SOLN
179.2000 mg | Freq: Once | INTRAVENOUS | Status: AC
  Administered 2018-02-01: 180 mg via INTRAVENOUS
  Filled 2018-02-01: qty 18

## 2018-02-01 MED ORDER — HEPARIN SOD (PORK) LOCK FLUSH 100 UNIT/ML IV SOLN
500.0000 [IU] | Freq: Once | INTRAVENOUS | Status: AC | PRN
  Administered 2018-02-01: 500 [IU]
  Filled 2018-02-01: qty 5

## 2018-02-01 MED ORDER — DEXAMETHASONE SODIUM PHOSPHATE 10 MG/ML IJ SOLN
INTRAMUSCULAR | Status: AC
  Filled 2018-02-01: qty 1

## 2018-02-01 MED ORDER — PALONOSETRON HCL INJECTION 0.25 MG/5ML
0.2500 mg | Freq: Once | INTRAVENOUS | Status: AC
  Administered 2018-02-01: 0.25 mg via INTRAVENOUS

## 2018-02-01 MED ORDER — FAMOTIDINE IN NACL 20-0.9 MG/50ML-% IV SOLN
INTRAVENOUS | Status: AC
  Filled 2018-02-01: qty 50

## 2018-02-01 MED ORDER — SODIUM CHLORIDE 0.9 % IV SOLN
80.0000 mg/m2 | Freq: Once | INTRAVENOUS | Status: AC
  Administered 2018-02-01: 150 mg via INTRAVENOUS
  Filled 2018-02-01: qty 25

## 2018-02-01 MED ORDER — SODIUM CHLORIDE 0.9% FLUSH
10.0000 mL | INTRAVENOUS | Status: DC | PRN
  Administered 2018-02-01: 10 mL
  Filled 2018-02-01: qty 10

## 2018-02-01 MED ORDER — DIPHENHYDRAMINE HCL 50 MG/ML IJ SOLN
INTRAMUSCULAR | Status: AC
  Filled 2018-02-01: qty 1

## 2018-02-01 MED ORDER — DEXAMETHASONE SODIUM PHOSPHATE 10 MG/ML IJ SOLN
4.0000 mg | Freq: Once | INTRAMUSCULAR | Status: AC
  Administered 2018-02-01: 4 mg via INTRAVENOUS

## 2018-02-01 MED ORDER — PALONOSETRON HCL INJECTION 0.25 MG/5ML
INTRAVENOUS | Status: AC
  Filled 2018-02-01: qty 5

## 2018-02-01 NOTE — Telephone Encounter (Signed)
Per MD may proceed with Taxol/Carbo with ANC of 1.4.

## 2018-02-01 NOTE — Patient Instructions (Addendum)
[facility] Discharge Instructions for Patients Receiving Chemotherapy  Today you received the following chemotherapy agents Carboplatin and Taxol.   To help prevent nausea and vomiting after your treatment, we encourage you to take your nausea medication as directed.  If you develop nausea and vomiting that is not controlled by your nausea medication, call the clinic.   BELOW ARE SYMPTOMS THAT SHOULD BE REPORTED IMMEDIATELY:  *FEVER GREATER THAN 100.5 F  *CHILLS WITH OR WITHOUT FEVER  NAUSEA AND VOMITING THAT IS NOT CONTROLLED WITH YOUR NAUSEA MEDICATION  *UNUSUAL SHORTNESS OF BREATH  *UNUSUAL BRUISING OR BLEEDING  TENDERNESS IN MOUTH AND THROAT WITH OR WITHOUT PRESENCE OF ULCERS  *URINARY PROBLEMS  *BOWEL PROBLEMS  UNUSUAL RASH Items with * indicate a potential emergency and should be followed up as soon as possible.  Feel free to call the clinic should you have any questions or concerns. The clinic phone number is [phone].  Please show the [place] at check-in to the Emergency Department and triage nurse.   Neutropenia Neutropenia is a condition that occurs when you have a lower-than-normal level of a type of white blood cell (neutrophil) in your body. Neutrophils are made in the spongy center of large bones (bone marrow) and they fight infections. Neutrophils are your body's main defense against bacterial and fungal infections. The fewer neutrophils you have and the longer your body remains without them, the greater your risk of getting a severe infection. What are the causes? This condition can occur if your body uses up or destroys neutrophils faster than your bone marrow can make them. This problem may happen because of:  Bacterial or fungal infection.  Allergic disorders.  Reactions to some medicines.  Autoimmune disease.  An enlarged spleen.  This condition can also occur if your bone marrow does not produce enough neutrophils.  This problem may be caused by:  Cancer.  Cancer treatments, such as radiation or chemotherapy.  Viral infections.  Medicines, such as phenytoin.  Vitamin B12 deficiency.  Diseases of the bone marrow.  Environmental toxins, such as insecticides.  What are the signs or symptoms? This condition does not usually cause symptoms. If symptoms are present, they are usually caused by an underlying infection. Symptoms of an infection may include:  Fever.  Chills.  Swollen glands.  Oral or anal ulcers.  Cough and shortness of breath.  Rash.  Skin infection.  Fatigue.  How is this diagnosed? Your health care provider may suspect neutropenia if you have:  A condition that may cause neutropenia.  Symptoms of infection, especially fever.  Frequent and unusual infections.  You will have a medical history and physical exam. Tests will also be done, such as:  A complete blood count (CBC).  A procedure to collect a sample of bone marrow for examination (bone marrow biopsy).  A chest X-ray.  A urine culture.  A blood culture.  How is this treated? Treatment depends on the underlying cause and severity of your condition. Mild neutropenia may not require treatment. Treatment may include medicines, such as:  Antibiotic medicine given through an IV tube.  Antiviral medicines.  Antifungal medicines.  A medicine to increase neutrophil production (colony-stimulating factor). You may get this drug through an IV tube or by injection.  Steroids given through an IV tube.  If an underlying condition is causing neutropenia, you may need treatment for that condition. If medicines you are taking are causing neutropenia, your health care provider may have you stop  taking those medicines. Follow these instructions at home: Medicines  Take over-the-counter and prescription medicines only as told by your health care provider.  Get a seasonal flu shot (influenza  vaccine). Lifestyle  Do not eat unpasteurized foods.Do not eat unwashed raw fruits or vegetables.  Avoid exposure to groups of people or children.  Avoid being around people who are sick.  Avoid being around dirt or dust, such as in construction areas or gardens.  Do not provide direct care for pets. Avoid animal droppings. Do not clean litter boxes and bird cages. Hygiene   Bathe daily.  Clean the area between the genitals and the anus (perineal area) after you urinate or have a bowel movement. If you are female, wipe from front to back.  Brush your teeth with a soft toothbrush before and after meals.  Do not use a razor that has a blade. Use an electric razor to remove hair.  Wash your hands often. Make sure others who come in contact with you also wash their hands. If soap and water are not available, use hand sanitizer. General instructions  Do not have sex unless your health care provider has approved.  Take actions to avoid cuts and burns. For example: ? Be cautious when you use knives. Always cut away from yourself. ? Keep knives in protective sheaths or guards when not in use. ? Use oven mitts when you cook with a hot stove, oven, or grill. ? Stand a safe distance away from open fires.  Avoid people who received a vaccine in the past 30 days if that vaccine contained a live version of the germ (live vaccine). You should not get a live vaccine. Common live vaccines are varicella, measles, mumps, and rubella.  Do not share food utensils.  Do not use tampons, enemas, or rectal suppositories unless your health care provider has approved.  Keep all appointments as told by your health care provider. This is important. Contact a health care provider if:  You have a fever.  You have chills or you start to shake.  You have: ? A sore throat. ? A warm, red, or tender area on your skin. ? A cough. ? Frequent or painful urination. ? Vaginal discharge or itching.  You  develop: ? Sores in your mouth or anus. ? Swollen lymph nodes. ? Red streaks on the skin. ? A rash.  You feel: ? Nauseous or you vomit. ? Very fatigued. ? Short of breath. This information is not intended to replace advice given to you by your health care provider. Make sure you discuss any questions you have with your health care provider. Document Released: 03/14/2002 Document Revised: 02/28/2016 Document Reviewed: 04/04/2015 Elsevier Interactive Patient Education  [employer].

## 2018-02-02 ENCOUNTER — Ambulatory Visit: Payer: Medicare Other

## 2018-02-02 ENCOUNTER — Other Ambulatory Visit: Payer: Medicare Other

## 2018-02-02 ENCOUNTER — Telehealth: Payer: Self-pay | Admitting: Oncology

## 2018-02-02 NOTE — Telephone Encounter (Signed)
Per 4/29 no los

## 2018-02-03 ENCOUNTER — Encounter: Payer: Self-pay | Admitting: *Deleted

## 2018-02-08 ENCOUNTER — Inpatient Hospital Stay: Payer: Medicare Other | Attending: Nurse Practitioner

## 2018-02-08 ENCOUNTER — Inpatient Hospital Stay: Payer: Medicare Other

## 2018-02-08 ENCOUNTER — Other Ambulatory Visit: Payer: Medicare Other

## 2018-02-08 VITALS — BP 162/80 | HR 81 | Temp 98.2°F | Resp 17

## 2018-02-08 DIAGNOSIS — C50411 Malignant neoplasm of upper-outer quadrant of right female breast: Secondary | ICD-10-CM | POA: Insufficient documentation

## 2018-02-08 DIAGNOSIS — Z171 Estrogen receptor negative status [ER-]: Secondary | ICD-10-CM | POA: Insufficient documentation

## 2018-02-08 DIAGNOSIS — Z95828 Presence of other vascular implants and grafts: Secondary | ICD-10-CM

## 2018-02-08 DIAGNOSIS — Z5111 Encounter for antineoplastic chemotherapy: Secondary | ICD-10-CM | POA: Diagnosis not present

## 2018-02-08 DIAGNOSIS — G620 Drug-induced polyneuropathy: Secondary | ICD-10-CM | POA: Diagnosis not present

## 2018-02-08 LAB — CBC WITH DIFFERENTIAL/PLATELET
Basophils Absolute: 0 10*3/uL (ref 0.0–0.1)
Basophils Relative: 1 %
EOS PCT: 2 %
Eosinophils Absolute: 0.1 10*3/uL (ref 0.0–0.5)
HEMATOCRIT: 33.1 % — AB (ref 34.8–46.6)
Hemoglobin: 11.1 g/dL — ABNORMAL LOW (ref 11.6–15.9)
LYMPHS PCT: 36 %
Lymphs Abs: 1.1 10*3/uL (ref 0.9–3.3)
MCH: 32.3 pg (ref 25.1–34.0)
MCHC: 33.4 g/dL (ref 31.5–36.0)
MCV: 96.8 fL (ref 79.5–101.0)
MONO ABS: 0.3 10*3/uL (ref 0.1–0.9)
MONOS PCT: 9 %
NEUTROS PCT: 52 %
Neutro Abs: 1.6 10*3/uL (ref 1.5–6.5)
PLATELETS: 236 10*3/uL (ref 145–400)
RBC: 3.42 MIL/uL — ABNORMAL LOW (ref 3.70–5.45)
RDW: 13.9 % (ref 11.2–14.5)
WBC: 3.2 10*3/uL — AB (ref 3.9–10.3)

## 2018-02-08 LAB — COMPREHENSIVE METABOLIC PANEL
ALT: 17 U/L (ref 0–55)
AST: 16 U/L (ref 5–34)
Albumin: 3.9 g/dL (ref 3.5–5.0)
Alkaline Phosphatase: 64 U/L (ref 40–150)
Anion gap: 4 (ref 3–11)
BUN: 16 mg/dL (ref 7–26)
CO2: 27 mmol/L (ref 22–29)
Calcium: 9.3 mg/dL (ref 8.4–10.4)
Chloride: 107 mmol/L (ref 98–109)
Creatinine, Ser: 0.82 mg/dL (ref 0.60–1.10)
GFR calc Af Amer: >60 mL/min (ref >60)
GFR calc non Af Amer: >60 mL/min (ref >60)
Glucose, Bld: 85 mg/dL (ref 70–140)
Potassium: 4.3 mmol/L (ref 3.5–5.1)
Sodium: 138 mmol/L (ref 136–145)
Total Bilirubin: 0.3 mg/dL (ref 0.2–1.2)
Total Protein: 7.3 g/dL (ref 6.4–8.3)

## 2018-02-08 MED ORDER — SODIUM CHLORIDE 0.9% FLUSH
10.0000 mL | Freq: Once | INTRAVENOUS | Status: AC
  Administered 2018-02-08: 10 mL
  Filled 2018-02-08: qty 10

## 2018-02-08 MED ORDER — HEPARIN SOD (PORK) LOCK FLUSH 100 UNIT/ML IV SOLN
500.0000 [IU] | Freq: Once | INTRAVENOUS | Status: AC | PRN
  Administered 2018-02-08: 500 [IU]
  Filled 2018-02-08: qty 5

## 2018-02-08 MED ORDER — HEPARIN SOD (PORK) LOCK FLUSH 100 UNIT/ML IV SOLN
500.0000 [IU] | Freq: Once | INTRAVENOUS | Status: DC
Start: 2018-02-08 — End: 2018-02-08
  Filled 2018-02-08: qty 5

## 2018-02-08 MED ORDER — DEXAMETHASONE SODIUM PHOSPHATE 10 MG/ML IJ SOLN
4.0000 mg | Freq: Once | INTRAMUSCULAR | Status: AC
  Administered 2018-02-08: 4 mg via INTRAVENOUS

## 2018-02-08 MED ORDER — FAMOTIDINE IN NACL 20-0.9 MG/50ML-% IV SOLN
INTRAVENOUS | Status: AC
  Filled 2018-02-08: qty 50

## 2018-02-08 MED ORDER — PALONOSETRON HCL INJECTION 0.25 MG/5ML
0.2500 mg | Freq: Once | INTRAVENOUS | Status: AC
  Administered 2018-02-08: 0.25 mg via INTRAVENOUS

## 2018-02-08 MED ORDER — FAMOTIDINE IN NACL 20-0.9 MG/50ML-% IV SOLN
20.0000 mg | Freq: Once | INTRAVENOUS | Status: AC
  Administered 2018-02-08: 20 mg via INTRAVENOUS

## 2018-02-08 MED ORDER — SODIUM CHLORIDE 0.9 % IV SOLN
80.0000 mg/m2 | Freq: Once | INTRAVENOUS | Status: AC
  Administered 2018-02-08: 150 mg via INTRAVENOUS
  Filled 2018-02-08: qty 25

## 2018-02-08 MED ORDER — DEXAMETHASONE SODIUM PHOSPHATE 10 MG/ML IJ SOLN
INTRAMUSCULAR | Status: AC
  Filled 2018-02-08: qty 1

## 2018-02-08 MED ORDER — SODIUM CHLORIDE 0.9 % IV SOLN
179.2000 mg | Freq: Once | INTRAVENOUS | Status: AC
  Administered 2018-02-08: 180 mg via INTRAVENOUS
  Filled 2018-02-08: qty 18

## 2018-02-08 MED ORDER — PALONOSETRON HCL INJECTION 0.25 MG/5ML
INTRAVENOUS | Status: AC
  Filled 2018-02-08: qty 5

## 2018-02-08 MED ORDER — DIPHENHYDRAMINE HCL 50 MG/ML IJ SOLN
25.0000 mg | Freq: Once | INTRAMUSCULAR | Status: AC
  Administered 2018-02-08: 25 mg via INTRAVENOUS

## 2018-02-08 MED ORDER — DIPHENHYDRAMINE HCL 50 MG/ML IJ SOLN
INTRAMUSCULAR | Status: AC
Start: 2018-02-08 — End: ?
  Filled 2018-02-08: qty 1

## 2018-02-08 MED ORDER — SODIUM CHLORIDE 0.9% FLUSH
10.0000 mL | INTRAVENOUS | Status: DC | PRN
  Administered 2018-02-08: 10 mL
  Filled 2018-02-08: qty 10

## 2018-02-08 MED ORDER — SODIUM CHLORIDE 0.9 % IV SOLN
Freq: Once | INTRAVENOUS | Status: AC
  Administered 2018-02-08: 15:00:00 via INTRAVENOUS

## 2018-02-08 NOTE — Patient Instructions (Signed)
[facility] Discharge Instructions for Patients Receiving Chemotherapy  Today you received the following chemotherapy agents:  Taxol, Carboplatin  To help prevent nausea and vomiting after your treatment, we encourage you to take your nausea medication as prescribed.   If you develop nausea and vomiting that is not controlled by your nausea medication, call the clinic.   BELOW ARE SYMPTOMS THAT SHOULD BE REPORTED IMMEDIATELY:  *FEVER GREATER THAN 100.5 F  *CHILLS WITH OR WITHOUT FEVER  NAUSEA AND VOMITING THAT IS NOT CONTROLLED WITH YOUR NAUSEA MEDICATION  *UNUSUAL SHORTNESS OF BREATH  *UNUSUAL BRUISING OR BLEEDING  TENDERNESS IN MOUTH AND THROAT WITH OR WITHOUT PRESENCE OF ULCERS  *URINARY PROBLEMS  *BOWEL PROBLEMS  UNUSUAL RASH Items with * indicate a potential emergency and should be followed up as soon as possible.  Feel free to call the clinic should you have any questions or concerns. The clinic phone number is [phone].  Please show the CHEMO ALERT CARD at check-in to the Emergency Department and triage nurse.   

## 2018-02-15 ENCOUNTER — Inpatient Hospital Stay (HOSPITAL_BASED_OUTPATIENT_CLINIC_OR_DEPARTMENT_OTHER): Payer: Medicare Other | Admitting: Adult Health

## 2018-02-15 ENCOUNTER — Inpatient Hospital Stay: Payer: Medicare Other

## 2018-02-15 ENCOUNTER — Encounter: Payer: Self-pay | Admitting: Adult Health

## 2018-02-15 ENCOUNTER — Encounter: Payer: Self-pay | Admitting: *Deleted

## 2018-02-15 VITALS — BP 138/81 | HR 101 | Temp 98.5°F | Ht 62.0 in | Wt 176.0 lb

## 2018-02-15 DIAGNOSIS — R200 Anesthesia of skin: Secondary | ICD-10-CM | POA: Diagnosis not present

## 2018-02-15 DIAGNOSIS — Z5111 Encounter for antineoplastic chemotherapy: Secondary | ICD-10-CM | POA: Diagnosis not present

## 2018-02-15 DIAGNOSIS — Z171 Estrogen receptor negative status [ER-]: Secondary | ICD-10-CM | POA: Diagnosis not present

## 2018-02-15 DIAGNOSIS — C50411 Malignant neoplasm of upper-outer quadrant of right female breast: Secondary | ICD-10-CM

## 2018-02-15 DIAGNOSIS — G620 Drug-induced polyneuropathy: Secondary | ICD-10-CM | POA: Diagnosis not present

## 2018-02-15 LAB — COMPREHENSIVE METABOLIC PANEL
ALBUMIN: 3.9 g/dL (ref 3.5–5.0)
ALK PHOS: 66 U/L (ref 40–150)
ALT: 19 U/L (ref 0–55)
ANION GAP: 9 (ref 3–11)
AST: 18 U/L (ref 5–34)
BUN: 18 mg/dL (ref 7–26)
CHLORIDE: 106 mmol/L (ref 98–109)
CO2: 27 mmol/L (ref 22–29)
CREATININE: 1 mg/dL (ref 0.60–1.10)
Calcium: 9.4 mg/dL (ref 8.4–10.4)
GFR calc Af Amer: >60 mL/min (ref >60)
GFR calc non Af Amer: 55 mL/min — ABNORMAL LOW (ref >60)
GLUCOSE: 129 mg/dL (ref 70–140)
Potassium: 3.8 mmol/L (ref 3.5–5.1)
SODIUM: 142 mmol/L (ref 136–145)
Total Bilirubin: 0.4 mg/dL (ref 0.2–1.2)
Total Protein: 7.3 g/dL (ref 6.4–8.3)

## 2018-02-15 LAB — CBC WITH DIFFERENTIAL/PLATELET
BASOS PCT: 1 %
Basophils Absolute: 0 10*3/uL (ref 0.0–0.1)
EOS ABS: 0 10*3/uL (ref 0.0–0.5)
Eosinophils Relative: 1 %
HCT: 33 % — ABNORMAL LOW (ref 34.8–46.6)
Hemoglobin: 11.2 g/dL — ABNORMAL LOW (ref 11.6–15.9)
LYMPHS ABS: 1.1 10*3/uL (ref 0.9–3.3)
Lymphocytes Relative: 36 %
MCH: 32.5 pg (ref 25.1–34.0)
MCHC: 33.9 g/dL (ref 31.5–36.0)
MCV: 95.7 fL (ref 79.5–101.0)
MONO ABS: 0.3 10*3/uL (ref 0.1–0.9)
MONOS PCT: 9 %
NEUTROS ABS: 1.6 10*3/uL (ref 1.5–6.5)
NEUTROS PCT: 53 %
PLATELETS: 207 10*3/uL (ref 145–400)
RBC: 3.45 MIL/uL — AB (ref 3.70–5.45)
RDW: 14.8 % — ABNORMAL HIGH (ref 11.2–14.5)
WBC: 3.1 10*3/uL — ABNORMAL LOW (ref 3.9–10.3)

## 2018-02-15 MED ORDER — FAMOTIDINE IN NACL 20-0.9 MG/50ML-% IV SOLN
20.0000 mg | Freq: Once | INTRAVENOUS | Status: AC
  Administered 2018-02-15: 20 mg via INTRAVENOUS

## 2018-02-15 MED ORDER — FAMOTIDINE IN NACL 20-0.9 MG/50ML-% IV SOLN
INTRAVENOUS | Status: AC
  Filled 2018-02-15: qty 50

## 2018-02-15 MED ORDER — DIPHENHYDRAMINE HCL 50 MG/ML IJ SOLN
25.0000 mg | Freq: Once | INTRAMUSCULAR | Status: AC
  Administered 2018-02-15: 14:00:00 via INTRAVENOUS

## 2018-02-15 MED ORDER — PALONOSETRON HCL INJECTION 0.25 MG/5ML
0.2500 mg | Freq: Once | INTRAVENOUS | Status: AC
Start: 2018-02-15 — End: 2018-02-15
  Administered 2018-02-15: 0.25 mg via INTRAVENOUS

## 2018-02-15 MED ORDER — SODIUM CHLORIDE 0.9 % IV SOLN
80.0000 mg/m2 | Freq: Once | INTRAVENOUS | Status: AC
  Administered 2018-02-15: 150 mg via INTRAVENOUS
  Filled 2018-02-15: qty 25

## 2018-02-15 MED ORDER — DIPHENHYDRAMINE HCL 50 MG/ML IJ SOLN
INTRAMUSCULAR | Status: AC
  Filled 2018-02-15: qty 1

## 2018-02-15 MED ORDER — SODIUM CHLORIDE 0.9% FLUSH
10.0000 mL | INTRAVENOUS | Status: DC | PRN
  Administered 2018-02-15: 10 mL
  Filled 2018-02-15: qty 10

## 2018-02-15 MED ORDER — SODIUM CHLORIDE 0.9 % IV SOLN
Freq: Once | INTRAVENOUS | Status: AC
  Administered 2018-02-15: 14:00:00 via INTRAVENOUS

## 2018-02-15 MED ORDER — HEPARIN SOD (PORK) LOCK FLUSH 100 UNIT/ML IV SOLN
500.0000 [IU] | Freq: Once | INTRAVENOUS | Status: AC | PRN
  Administered 2018-02-15: 500 [IU]
  Filled 2018-02-15: qty 5

## 2018-02-15 MED ORDER — CARBOPLATIN CHEMO INJECTION 450 MG/45ML
180.0000 mg | Freq: Once | INTRAVENOUS | Status: AC
  Administered 2018-02-15: 180 mg via INTRAVENOUS
  Filled 2018-02-15: qty 18

## 2018-02-15 MED ORDER — DEXAMETHASONE SODIUM PHOSPHATE 10 MG/ML IJ SOLN
INTRAMUSCULAR | Status: AC
  Filled 2018-02-15: qty 1

## 2018-02-15 MED ORDER — DEXAMETHASONE SODIUM PHOSPHATE 10 MG/ML IJ SOLN
4.0000 mg | Freq: Once | INTRAMUSCULAR | Status: AC
  Administered 2018-02-15: 14:00:00 via INTRAVENOUS

## 2018-02-15 MED ORDER — PALONOSETRON HCL INJECTION 0.25 MG/5ML
INTRAVENOUS | Status: AC
  Filled 2018-02-15: qty 5

## 2018-02-15 NOTE — Patient Instructions (Signed)
[facility] Discharge Instructions for Patients Receiving Chemotherapy  Today you received the following chemotherapy agents:  Taxol, Carboplatin  To help prevent nausea and vomiting after your treatment, we encourage you to take your nausea medication as prescribed.   If you develop nausea and vomiting that is not controlled by your nausea medication, call the clinic.   BELOW ARE SYMPTOMS THAT SHOULD BE REPORTED IMMEDIATELY:  *FEVER GREATER THAN 100.5 F  *CHILLS WITH OR WITHOUT FEVER  NAUSEA AND VOMITING THAT IS NOT CONTROLLED WITH YOUR NAUSEA MEDICATION  *UNUSUAL SHORTNESS OF BREATH  *UNUSUAL BRUISING OR BLEEDING  TENDERNESS IN MOUTH AND THROAT WITH OR WITHOUT PRESENCE OF ULCERS  *URINARY PROBLEMS  *BOWEL PROBLEMS  UNUSUAL RASH Items with * indicate a potential emergency and should be followed up as soon as possible.  Feel free to call the clinic should you have any questions or concerns. The clinic phone number is [phone].  Please show the CHEMO ALERT CARD at check-in to the Emergency Department and triage nurse.   

## 2018-02-15 NOTE — Progress Notes (Signed)
[place]  [phone] [phone]     ID: Veronica Price DOB: 13-Jan-1946  MR#: [id number]  OXB#:[id number]  Patient Care Team: Veronica Helper, MD as PCP - General [name], Veronica Dad, MD as Consulting Physician (Oncology) [name], Veronica Dad, MD as Consulting Physician (Oncology) Veronica Skates, MD as Consulting Physician (General Surgery) OTHER MD:  CHIEF COMPLAINT: Triple negative breast cancer  CURRENT TREATMENT: Neoadjuvant chemotherapy    INTERVAL HISTORY: Veronica Price returns today for follow up and treatment of her triple negative breast cancer, accompanied by her daughter.  She is currently undergoing neoadjuvant chemotherapy with weekly paclitaxel/carboplatin, to be followed by cyclophosphamide/doxorubicin in dose dense fashion x4. Today is day 1 cycle 7.    REVIEW OF SYSTEMS: Veronica Price returns today for her chemotherapy treatment. She has continued numbness in three of her toes in her left foot.  She brought this to Dr. Virgie Price attention two weeks ago.  It is unchanged today.  She continues to stay active. She denies any other issues today such as fevers, chills, nausea vomiting, headaches, shortness of breath, any other peripheral neuropathy symptoms or any other concerns.  A detailed ROS was otherwise non contributory.      HISTORY OF CURRENT ILLNESS: Veronica Price initially palpated a mass to her right breast in February 2019.   She brought her to her primary care physician's attention and was set up for bilateral diagnostic mammography with tomography and right breast ultrasonography on 11/25/2017 showing: a 3.2 x 3.7 cm lobulated mass in the UOQ of the right breast; there was a 3 cm calcified retroareolar mass in the left breast. On right breast ultrasonography there was a complex mostly solid 2.4 x 3.1 cm mass at 10 o'clock and multiple enlarged right axillary lymph nodes, the largest measuring 1.6 x 2.4.  Accordingly on 11/27/2017  the patient proceeded to biopsy of the right breast area in question, as well as an attempted axillary node biopsy. The pathology from this procedure showed ([id number]):  high grade infiltrating ductal carcinoma with no lymphovascular invasion.  The attempted axillary node biopsy showed fat, no nodal tissue.  Prognostic indicators were: estrogen and progesterone receptor negative,. Proliferation marker Ki67 at >80%. HER2 negative by immunohistochemistry at 0%  CT scans of the chest abdomen and pelvis completed on 12/10/2017 showed A complex solid and cystic 3.5 cm right breast mass with several bilateral axillary lymph nodes, the largest in the right axilla measured 12 mm. There was no evidence of visceral metastatic disease.   She had a bone scan on 12/10/2017 that showed two areas of increased uptake overlying the lateral aspects of the T11 vertebrae. Review of the CT scan of the chest revealed sclerosis of the T11 pedicles concerning for bone metastases.  However subsequent spinal MRI (I do not have that report) read this out as arthritis, not a metastatic deposit  The patient had a echocardiogram completed on 3/72019 with results showing: left ventricular ejection fraction of 55-60% with mild concentric hypertrophy. There was mild to moderate mitral and mild tricuspid regurgitation with mild pulmonary hypertension. The aortic valve was mildly thickened with trace aortic regurgitation.   The patient's subsequent history is as detailed below.  PAST MEDICAL HISTORY: Past Medical History:  Diagnosis Date  . Arthritis   . Breast cancer ([place])    Right breast  . Cataract    "early" per patient  . GERD (gastroesophageal reflux disease)    occasionaly tums    PAST SURGICAL  HISTORY: Past Surgical History:  Procedure Laterality Date  . ABDOMINAL HYSTERECTOMY     Total hysterectomy in 2002  . PORTACATH PLACEMENT N/A 12/29/2017   Procedure: INSERTION PORT-A-CATH LEFT SUBCLAVIAN;  Surgeon: Veronica Skates, MD;  Location: [facility];  Service: General;  Laterality: N/A;    FAMILY HISTORY Family History  Problem Relation Age of Onset  . Diabetes Father   . Heart attack Father    She notes that her father died from a possible MI in his early 39's Her mother is still alive and will be 66 March 2019!  The patient has 2 brothers and 2 sisters. Her brother had prostate cancer possibly agent orange related. She has a cousin who was recently diagnosed with stomach cancer. Her maternal grandmother died from colon cancer. She has a maternal uncle with bone cancer. She denies family hx of breast or ovarian cancer.    GYNECOLOGIC HISTORY:  Menarche: 72 years old Age at first live birth: 72 years old [place] P3 LMP: at age 51 Contraceptive: OCP HRT   Hysterectomy? 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 She also worked at [employer]. She lives at home with her husband, Veronica Price who is a retired Building control surveyor, her husband works part time driving an adult day care bus. Her daughter, Veronica Price is a Multimedia programmer. Her daughter Veronica Price lives in [place], [place] and works as a Therapist, sports for the [place]. Her son Veronica Price, lives in [place], and works as a Administrator.  The patient has 3 grandchildren and no great-grandchildren. She is of baptist faith.    ADVANCED DIRECTIVES:    HEALTH MAINTENANCE: Social History   Tobacco Use  . Smoking status: Never Smoker  . Smokeless tobacco: Never Used  Substance Use Topics  . Alcohol use: Never    Frequency: Never  . Drug use: Never     Colonoscopy: UTD; last year (2018)  PAP:  Bone density:    Allergies  Allergen Reactions  . Prednisone Swelling and Other (See Comments)    " I think it was this that made my tongue swell "    Current Outpatient Medications  Medication Sig Dispense Refill  . HYDROcodone-acetaminophen (NORCO) 5-325 MG tablet Take 1-2  tablets by mouth every 6 (six) hours as needed for moderate pain or severe pain. 20 tablet 0  . lidocaine-prilocaine (EMLA) cream Apply to affected area once 30 g 3  . naproxen sodium (ALEVE) 220 MG tablet Take 220 mg by mouth daily as needed (for pain).    . ondansetron (ZOFRAN) 8 MG tablet Take 1 tablet (8 mg total) by mouth 2 (two) times daily as needed for refractory nausea / vomiting. Start on day 3 after chemo. 30 tablet 1  . prochlorperazine (COMPAZINE) 10 MG tablet Take 1 tablet (10 mg total) by mouth every 6 (six) hours as needed (Nausea or vomiting). 30 tablet 1   No current facility-administered medications for this visit.     OBJECTIVE:  Vitals:   02/15/18 1252  BP: 138/81  Pulse: (!) 101  Temp: 98.5 F (36.9 C)  SpO2: 100%     Body mass index is 32.19 kg/m.   Wt Readings from Last 3 Encounters:  02/15/18 176 lb (79.8 kg)  01/25/18 178 lb 8 oz (81 kg)  01/18/18 178 lb 3.2 oz (80.8 kg)  ECOG FS:1 - Symptomatic but completely ambulatory GENERAL:  Patient is a well appearing female in no acute distress HEENT:  Sclerae anicteric.  Oropharynx clear and moist. No ulcerations or evidence of oropharyngeal candidiasis. Neck is supple.  NODES:  No cervical, supraclavicular, or axillary lymphadenopathy palpated.  BREAST EXAM:  Deferred. LUNGS:  Clear to auscultation bilaterally.  No wheezes or rhonchi. HEART:  Regular rate and rhythm. No murmur appreciated. ABDOMEN:  Soft, nontender.  Positive, normoactive bowel sounds. No organomegaly palpated. MSK:  No focal spinal tenderness to palpation. Full range of motion bilaterally in the upper extremities. EXTREMITIES:  No peripheral edema.   SKIN:  Clear with no obvious rashes or skin changes. No nail dyscrasia. NEURO:  Nonfocal. Well oriented.  Appropriate affect.    LAB RESULTS:  CMP     Component Value Date/Time   NA 138 02/08/2018 1229   K 4.3 02/08/2018 1229   CL 107 02/08/2018 1229   CO2 27 02/08/2018 1229   GLUCOSE 85  02/08/2018 1229   BUN 16 02/08/2018 1229   CREATININE 0.82 02/08/2018 1229   CREATININE 0.80 01/11/2018 0747   CALCIUM 9.3 02/08/2018 1229   PROT 7.3 02/08/2018 1229   ALBUMIN 3.9 02/08/2018 1229   AST 16 02/08/2018 1229   AST 17 01/11/2018 0747   ALT 17 02/08/2018 1229   ALT 16 01/11/2018 0747   ALKPHOS 64 02/08/2018 1229   BILITOT 0.3 02/08/2018 1229   BILITOT 0.6 01/11/2018 0747   GFRNONAA >60 02/08/2018 1229   GFRNONAA >60 01/11/2018 0747   GFRAA >60 02/08/2018 1229   GFRAA >60 01/11/2018 0747    No results found for: TOTALPROTELP, ALBUMINELP, A1GS, A2GS, BETS, BETA2SER, GAMS, MSPIKE, SPEI  No results found for: KPAFRELGTCHN, LAMBDASER, KAPLAMBRATIO  Lab Results  Component Value Date   WBC 3.1 (L) 02/15/2018   NEUTROABS 1.6 02/15/2018   HGB 11.2 (L) 02/15/2018   HCT 33.0 (L) 02/15/2018   MCV 95.7 02/15/2018   PLT 207 02/15/2018    @LASTCHEMISTRY @  No results found for: LABCA2  No components found for: GQBVQX450  No results for input(s): INR in the last 168 hours.  No results found for: LABCA2  No results found for: TUU828  No results found for: MKL491  No results found for: PHX505  No results found for: CA2729  No components found for: HGQUANT  No results found for: CEA1 / No results found for: CEA1   No results found for: AFPTUMOR  No results found for: CHROMOGRNA  No results found for: PSA1  Appointment on 02/15/2018  Component Date Value Ref Range Status  . WBC 02/15/2018 3.1* 3.9 - 10.3 K/uL Final  . RBC 02/15/2018 3.45* 3.70 - 5.45 MIL/uL Final  . Hemoglobin 02/15/2018 11.2* 11.6 - 15.9 g/dL Final  . HCT 02/15/2018 33.0* 34.8 - 46.6 % Final  . MCV 02/15/2018 95.7  79.5 - 101.0 fL Final  . MCH 02/15/2018 32.5  25.1 - 34.0 pg Final  . MCHC 02/15/2018 33.9  31.5 - 36.0 g/dL Final  . RDW 02/15/2018 14.8* 11.2 - 14.5 % Final  . Platelets 02/15/2018 207  145 - 400 K/uL Final  . Neutrophils Relative % 02/15/2018 53  % Final  . Neutro Abs  02/15/2018 1.6  1.5 - 6.5 K/uL Final  . Lymphocytes Relative 02/15/2018 36  % Final  . Lymphs Abs 02/15/2018 1.1  0.9 - 3.3 K/uL Final  . Monocytes Relative 02/15/2018 9  % Final  . Monocytes Absolute 02/15/2018 0.3  0.1 - 0.9 K/uL Final  . Eosinophils Relative  02/15/2018 1  % Final  . Eosinophils Absolute 02/15/2018 0.0  0.0 - 0.5 K/uL Final  . Basophils Relative 02/15/2018 1  % Final  . Basophils Absolute 02/15/2018 0.0  0.0 - 0.1 K/uL Final   Performed at [facility], [place] [address] [place], [place] [address]    (this displays the last labs from the last 3 days)  No results found for: TOTALPROTELP, ALBUMINELP, A1GS, A2GS, BETS, BETA2SER, GAMS, MSPIKE, SPEI (this displays SPEP labs)  No results found for: KPAFRELGTCHN, LAMBDASER, KAPLAMBRATIO (kappa/lambda light chains)  No results found for: HGBA, HGBA2QUANT, HGBFQUANT, HGBSQUAN (Hemoglobinopathy evaluation)   No results found for: LDH  No results found for: IRON, TIBC, IRONPCTSAT (Iron and TIBC)  No results found for: FERRITIN  Urinalysis No results found for: COLORURINE, APPEARANCEUR, LABSPEC, PHURINE, GLUCOSEU, HGBUR, BILIRUBINUR, KETONESUR, PROTEINUR, UROBILINOGEN, NITRITE, LEUKOCYTESUR   STUDIES: No results found.  ELIGIBLE FOR AVAILABLE RESEARCH PROTOCOL: Consider SWOG [phone] post-op  ASSESSMENT: 72 y.o. [name], [place] woman status post right breast upper outer quadrant biopsy 11/25/2017 for a clinical T2 N2, stage IIIc invasive ductal carcinoma, grade 3, triple negative, with an MIB-1 of 80%.  (1) staging studies:  (a) chest CT 12/10/2017 scan showed no visceral metastatic disease  (b) bone scan 12/10/2017 showed uptake at T11, with sclerosis.  (c) CA-27-29 on 12/05/2017 was 19.0  (d) spinal MRI in [facility] [facility] reportedly showed only arthrtis at T11  (2) neoadjuvant chemotherapy consisting of carboplatin/ paclitaxel x 12 starting 01/04/2018, to be followed by cytoxan/ adriamycin x 4  (3)  definitive surgery to follow  (4) adjuvant radiation as appropriate   PLAN: Veronica Price is doing well today.  She will proceed with her seventh week of Paclitaxel and Carboplatin (CMET pending, so long as it is within parameters).  I reviewed her CBC with her in detail.   She would like to take a trip July 22 to [place] with her husband and family.  I told her that we would work together to come up with a schedule that could accommodate that.  She will return in one week for labs and chemotherapy, and in 2 weeks for labs, f/u, and chemotherapy.   They know to call for any other issues that may develop before her next visit.  A total of (30) minutes of face-to-face time was spent with this patient with greater than 50% of that time in counseling and care-coordination.   Wilber Bihari, NP  02/15/18 1:06 PM Medical Oncology and Hematology [facility] [address] [place], [place] [address] Tel. [phone]    Fax. [phone]

## 2018-02-19 ENCOUNTER — Telehealth: Payer: Self-pay | Admitting: Oncology

## 2018-02-19 NOTE — Telephone Encounter (Signed)
Spoke to patients daughter regarding upcoming appointments. Patient scheduled second week of June due to avail of the treatment area. Provider aware of updates. Patient scheduled per 5/13 los.

## 2018-02-22 ENCOUNTER — Inpatient Hospital Stay: Payer: Medicare Other

## 2018-02-22 ENCOUNTER — Telehealth: Payer: Self-pay | Admitting: *Deleted

## 2018-02-22 VITALS — BP 141/81 | HR 75 | Temp 97.9°F | Resp 16 | Wt 180.0 lb

## 2018-02-22 DIAGNOSIS — Z171 Estrogen receptor negative status [ER-]: Principal | ICD-10-CM

## 2018-02-22 DIAGNOSIS — Z95828 Presence of other vascular implants and grafts: Secondary | ICD-10-CM

## 2018-02-22 DIAGNOSIS — C50411 Malignant neoplasm of upper-outer quadrant of right female breast: Secondary | ICD-10-CM

## 2018-02-22 DIAGNOSIS — G620 Drug-induced polyneuropathy: Secondary | ICD-10-CM | POA: Diagnosis not present

## 2018-02-22 DIAGNOSIS — Z5111 Encounter for antineoplastic chemotherapy: Secondary | ICD-10-CM | POA: Diagnosis not present

## 2018-02-22 LAB — COMPREHENSIVE METABOLIC PANEL
ALK PHOS: 63 U/L (ref 40–150)
ALT: 21 U/L (ref 0–55)
AST: 21 U/L (ref 5–34)
Albumin: 4 g/dL (ref 3.5–5.0)
Anion gap: 7 (ref 3–11)
BUN: 13 mg/dL (ref 7–26)
CO2: 26 mmol/L (ref 22–29)
CREATININE: 0.8 mg/dL (ref 0.60–1.10)
Calcium: 9.2 mg/dL (ref 8.4–10.4)
Chloride: 107 mmol/L (ref 98–109)
Glucose, Bld: 88 mg/dL (ref 70–140)
Potassium: 4.1 mmol/L (ref 3.5–5.1)
Sodium: 140 mmol/L (ref 136–145)
TOTAL PROTEIN: 7.4 g/dL (ref 6.4–8.3)
Total Bilirubin: 0.5 mg/dL (ref 0.2–1.2)

## 2018-02-22 LAB — CBC WITH DIFFERENTIAL/PLATELET
BASOS ABS: 0 10*3/uL (ref 0.0–0.1)
BASOS PCT: 0 %
EOS ABS: 0 10*3/uL (ref 0.0–0.5)
EOS PCT: 1 %
HCT: 32.3 % — ABNORMAL LOW (ref 34.8–46.6)
Hemoglobin: 10.7 g/dL — ABNORMAL LOW (ref 11.6–15.9)
LYMPHS ABS: 1.3 10*3/uL (ref 0.9–3.3)
Lymphocytes Relative: 48 %
MCH: 32.5 pg (ref 25.1–34.0)
MCHC: 33.1 g/dL (ref 31.5–36.0)
MCV: 98.2 fL (ref 79.5–101.0)
Monocytes Absolute: 0.2 10*3/uL (ref 0.1–0.9)
Monocytes Relative: 9 %
NEUTROS PCT: 42 %
Neutro Abs: 1.2 10*3/uL — ABNORMAL LOW (ref 1.5–6.5)
PLATELETS: 169 10*3/uL (ref 145–400)
RBC: 3.29 MIL/uL — AB (ref 3.70–5.45)
RDW: 15.8 % — ABNORMAL HIGH (ref 11.2–14.5)
WBC: 2.8 10*3/uL — AB (ref 3.9–10.3)

## 2018-02-22 MED ORDER — DIPHENHYDRAMINE HCL 50 MG/ML IJ SOLN
INTRAMUSCULAR | Status: AC
  Filled 2018-02-22: qty 1

## 2018-02-22 MED ORDER — SODIUM CHLORIDE 0.9% FLUSH
10.0000 mL | INTRAVENOUS | Status: DC | PRN
  Administered 2018-02-22: 10 mL
  Filled 2018-02-22: qty 10

## 2018-02-22 MED ORDER — SODIUM CHLORIDE 0.9 % IV SOLN
Freq: Once | INTRAVENOUS | Status: AC
  Administered 2018-02-22: 14:00:00 via INTRAVENOUS

## 2018-02-22 MED ORDER — SODIUM CHLORIDE 0.9 % IV SOLN
80.0000 mg/m2 | Freq: Once | INTRAVENOUS | Status: AC
  Administered 2018-02-22: 150 mg via INTRAVENOUS
  Filled 2018-02-22: qty 25

## 2018-02-22 MED ORDER — HEPARIN SOD (PORK) LOCK FLUSH 100 UNIT/ML IV SOLN
500.0000 [IU] | Freq: Once | INTRAVENOUS | Status: AC | PRN
  Administered 2018-02-22: 500 [IU]
  Filled 2018-02-22: qty 5

## 2018-02-22 MED ORDER — PALONOSETRON HCL INJECTION 0.25 MG/5ML
0.2500 mg | Freq: Once | INTRAVENOUS | Status: AC
  Administered 2018-02-22: 0.25 mg via INTRAVENOUS

## 2018-02-22 MED ORDER — PALONOSETRON HCL INJECTION 0.25 MG/5ML
INTRAVENOUS | Status: AC
  Filled 2018-02-22: qty 5

## 2018-02-22 MED ORDER — DEXAMETHASONE SODIUM PHOSPHATE 10 MG/ML IJ SOLN
4.0000 mg | Freq: Once | INTRAMUSCULAR | Status: AC
  Administered 2018-02-22: 4 mg via INTRAVENOUS

## 2018-02-22 MED ORDER — DEXAMETHASONE SODIUM PHOSPHATE 10 MG/ML IJ SOLN
INTRAMUSCULAR | Status: AC
  Filled 2018-02-22: qty 1

## 2018-02-22 MED ORDER — SODIUM CHLORIDE 0.9% FLUSH
10.0000 mL | Freq: Once | INTRAVENOUS | Status: AC
  Administered 2018-02-22: 10 mL
  Filled 2018-02-22: qty 10

## 2018-02-22 MED ORDER — FAMOTIDINE IN NACL 20-0.9 MG/50ML-% IV SOLN
INTRAVENOUS | Status: AC
  Filled 2018-02-22: qty 50

## 2018-02-22 MED ORDER — DIPHENHYDRAMINE HCL 50 MG/ML IJ SOLN
25.0000 mg | Freq: Once | INTRAMUSCULAR | Status: AC
  Administered 2018-02-22: 25 mg via INTRAVENOUS

## 2018-02-22 MED ORDER — SODIUM CHLORIDE 0.9 % IV SOLN
180.0000 mg | Freq: Once | INTRAVENOUS | Status: AC
  Administered 2018-02-22: 180 mg via INTRAVENOUS
  Filled 2018-02-22: qty 18

## 2018-02-22 MED ORDER — FAMOTIDINE IN NACL 20-0.9 MG/50ML-% IV SOLN
20.0000 mg | Freq: Once | INTRAVENOUS | Status: AC
  Administered 2018-02-22: 20 mg via INTRAVENOUS

## 2018-02-22 NOTE — Telephone Encounter (Signed)
Per ANC of 1.2 and WBC of 2.8 reviewed by MD and OK to proceed with treatment today of taxol/carbo.  Above called to treatment room nurse.

## 2018-02-22 NOTE — Patient Instructions (Signed)
   [facility] Discharge Instructions for Patients Receiving Chemotherapy  Today you received the following chemotherapy agents Taxol and Carboplatin   To help prevent nausea and vomiting after your treatment, we encourage you to take your nausea medication as directed.    If you develop nausea and vomiting that is not controlled by your nausea medication, call the clinic.   BELOW ARE SYMPTOMS THAT SHOULD BE REPORTED IMMEDIATELY:  *FEVER GREATER THAN 100.5 F  *CHILLS WITH OR WITHOUT FEVER  NAUSEA AND VOMITING THAT IS NOT CONTROLLED WITH YOUR NAUSEA MEDICATION  *UNUSUAL SHORTNESS OF BREATH  *UNUSUAL BRUISING OR BLEEDING  TENDERNESS IN MOUTH AND THROAT WITH OR WITHOUT PRESENCE OF ULCERS  *URINARY PROBLEMS  *BOWEL PROBLEMS  UNUSUAL RASH Items with * indicate a potential emergency and should be followed up as soon as possible.  Feel free to call the clinic should you have any questions or concerns. The clinic phone number is [phone].  Please show the CHEMO ALERT CARD at check-in to the Emergency Department and triage nurse.   

## 2018-02-24 ENCOUNTER — Other Ambulatory Visit: Payer: Self-pay | Admitting: Oncology

## 2018-03-04 ENCOUNTER — Encounter: Payer: Self-pay | Admitting: Adult Health

## 2018-03-04 ENCOUNTER — Inpatient Hospital Stay: Payer: Medicare Other

## 2018-03-04 ENCOUNTER — Inpatient Hospital Stay (HOSPITAL_BASED_OUTPATIENT_CLINIC_OR_DEPARTMENT_OTHER): Payer: Medicare Other | Admitting: Adult Health

## 2018-03-04 VITALS — BP 153/85 | HR 70 | Temp 98.1°F | Resp 18 | Wt 178.8 lb

## 2018-03-04 DIAGNOSIS — C50411 Malignant neoplasm of upper-outer quadrant of right female breast: Secondary | ICD-10-CM

## 2018-03-04 DIAGNOSIS — Z171 Estrogen receptor negative status [ER-]: Principal | ICD-10-CM

## 2018-03-04 DIAGNOSIS — G620 Drug-induced polyneuropathy: Secondary | ICD-10-CM | POA: Diagnosis not present

## 2018-03-04 DIAGNOSIS — Z5111 Encounter for antineoplastic chemotherapy: Secondary | ICD-10-CM | POA: Diagnosis not present

## 2018-03-04 DIAGNOSIS — Z95828 Presence of other vascular implants and grafts: Secondary | ICD-10-CM

## 2018-03-04 LAB — CBC WITH DIFFERENTIAL/PLATELET
Basophils Absolute: 0 10*3/uL (ref 0.0–0.1)
Basophils Relative: 1 %
Eosinophils Absolute: 0 10*3/uL (ref 0.0–0.5)
Eosinophils Relative: 1 %
HCT: 30.1 % — ABNORMAL LOW (ref 34.8–46.6)
HEMOGLOBIN: 10.3 g/dL — AB (ref 11.6–15.9)
LYMPHS ABS: 0.8 10*3/uL — AB (ref 0.9–3.3)
LYMPHS PCT: 36 %
MCH: 33.6 pg (ref 25.1–34.0)
MCHC: 34.1 g/dL (ref 31.5–36.0)
MCV: 98.6 fL (ref 79.5–101.0)
Monocytes Absolute: 0.4 10*3/uL (ref 0.1–0.9)
Monocytes Relative: 18 %
NEUTROS ABS: 1 10*3/uL — AB (ref 1.5–6.5)
NEUTROS PCT: 44 %
PLATELETS: 252 10*3/uL (ref 145–400)
RBC: 3.06 MIL/uL — AB (ref 3.70–5.45)
RDW: 18.5 % — ABNORMAL HIGH (ref 11.2–14.5)
WBC: 2.2 10*3/uL — AB (ref 3.9–10.3)

## 2018-03-04 LAB — COMPREHENSIVE METABOLIC PANEL
ALT: 20 U/L (ref 0–55)
ANION GAP: 7 (ref 3–11)
AST: 21 U/L (ref 5–34)
Albumin: 3.8 g/dL (ref 3.5–5.0)
Alkaline Phosphatase: 61 U/L (ref 40–150)
BUN: 11 mg/dL (ref 7–26)
CHLORIDE: 108 mmol/L (ref 98–109)
CO2: 25 mmol/L (ref 22–29)
Calcium: 9.2 mg/dL (ref 8.4–10.4)
Creatinine, Ser: 0.82 mg/dL (ref 0.60–1.10)
Glucose, Bld: 81 mg/dL (ref 70–140)
POTASSIUM: 4.4 mmol/L (ref 3.5–5.1)
Sodium: 140 mmol/L (ref 136–145)
Total Protein: 7 g/dL (ref 6.4–8.3)

## 2018-03-04 MED ORDER — HEPARIN SOD (PORK) LOCK FLUSH 100 UNIT/ML IV SOLN
500.0000 [IU] | Freq: Once | INTRAVENOUS | Status: AC
  Administered 2018-03-04: 500 [IU]
  Filled 2018-03-04: qty 5

## 2018-03-04 MED ORDER — SODIUM CHLORIDE 0.9% FLUSH
10.0000 mL | Freq: Once | INTRAVENOUS | Status: AC
  Administered 2018-03-04: 10 mL
  Filled 2018-03-04: qty 10

## 2018-03-04 NOTE — Progress Notes (Signed)
[place]  [phone] [phone]     ID: Veronica Price DOB: 08-28-1946  MR#: [id number]  PIR#:[id number]  Patient Care Team: [name], MD as PCP - General [name], [name], MD as Consulting Physician (Oncology) [name], [name], MD as Consulting Physician (Oncology) [name], MD as Consulting Physician (General Surgery) OTHER MD:  CHIEF COMPLAINT: Triple negative breast cancer  CURRENT TREATMENT: Neoadjuvant chemotherapy    INTERVAL HISTORY: Veronica Price returns today for follow up and treatment of her triple negative breast cancer, accompanied by her daughter.  She is currently undergoing neoadjuvant chemotherapy with weekly paclitaxel/carboplatin, to be followed by cyclophosphamide/doxorubicin in dose dense fashion x4. Today is day 1 cycle 9.    REVIEW OF SYSTEMS: Veronica Price is here today to receive her weekly Paclitaxel and Carboplatin.  She is doing well today.  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  She previously had neuropathy in one foot.  Now it is in both.  She denies any balance changes.  Her ANC is 1.  She denies any fevers, chills, or any other concerns today.  A detailed ROS is otherwise non contributory.   HISTORY OF CURRENT ILLNESS: Veronica Price initially palpated a mass to her right breast in February 2019.   She brought her to her primary care physician's attention and was set up for bilateral diagnostic mammography with tomography and right breast ultrasonography on 11/25/2017 showing: a 3.2 x 3.7 cm lobulated mass in the UOQ of the right breast; there was a 3 cm calcified retroareolar mass in the left breast. On right breast ultrasonography there was a complex mostly solid 2.4 x 3.1 cm mass at 10 o'clock and multiple enlarged right axillary lymph nodes, the largest measuring 1.6 x 2.4.  Accordingly on 11/27/2017 the patient  proceeded to biopsy of the right breast area in question, as well as an attempted axillary node biopsy. The pathology from this procedure showed ([id number]):  high grade infiltrating ductal carcinoma with no lymphovascular invasion.  The attempted axillary node biopsy showed fat, no nodal tissue.  Prognostic indicators were: estrogen and progesterone receptor negative,. Proliferation marker Ki67 at >80%. HER2 negative by immunohistochemistry at 0%  CT scans of the chest abdomen and pelvis completed on 12/10/2017 showed A complex solid and cystic 3.5 cm right breast mass with several bilateral axillary lymph nodes, the largest in the right axilla measured 12 mm. There was no evidence of visceral metastatic disease.   She had a bone scan on 12/10/2017 that showed two areas of increased uptake overlying the lateral aspects of the T11 vertebrae. Review of the CT scan of the chest revealed sclerosis of the T11 pedicles concerning for bone metastases.  However subsequent spinal MRI (I do not have that report) read this out as arthritis, not a metastatic deposit  The patient had a echocardiogram completed on 3/72019 with results showing: left ventricular ejection fraction of 55-60% with mild concentric hypertrophy. There was mild to moderate mitral and mild tricuspid regurgitation with mild pulmonary hypertension. The aortic valve was mildly thickened with trace aortic regurgitation.   The patient's subsequent history is as detailed below.  PAST MEDICAL HISTORY: Past Medical History:  Diagnosis Date  . Arthritis   . Breast cancer ([place])    Right breast  . Cataract    "early" per patient  . GERD (gastroesophageal reflux disease)    occasionaly  tums    PAST SURGICAL HISTORY: Past Surgical History:  Procedure Laterality Date  . ABDOMINAL HYSTERECTOMY     Total hysterectomy in 2002  . PORTACATH PLACEMENT N/A 12/29/2017   Procedure: INSERTION PORT-A-CATH LEFT SUBCLAVIAN;  Surgeon: [name], MD;   Location: [facility];  Service: General;  Laterality: N/A;    FAMILY HISTORY Family History  Problem Relation Age of Onset  . Diabetes Father   . Heart attack Father    She notes that her father died from a possible MI in his early 46's Her mother is still alive and will be 01 January 2018!  The patient has 2 brothers and 2 sisters. Her brother had prostate cancer possibly agent orange related. She has a cousin who was recently diagnosed with stomach cancer. Her maternal grandmother died from colon cancer. She has a maternal uncle with bone cancer. She denies family hx of breast or ovarian cancer.    GYNECOLOGIC HISTORY:  Menarche: 72 years old Age at first live birth: 72 years old [place] P3 LMP: at age 30 Contraceptive: OCP HRT   Hysterectomy? 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 She also worked at [employer]. She lives at home with her husband, Gwyndolyn Saxon who is a retired Building control surveyor, her husband works part time driving an adult day care bus. Her daughter, [name] is a Multimedia programmer. Her daughter [name] lives in [place], [place] and works as a Therapist, sports for the [place]. Her son Shaylinn Hladik, lives in [place], and works as a Administrator.  The patient has 3 grandchildren and no great-grandchildren. She is of baptist faith.    ADVANCED DIRECTIVES:    HEALTH MAINTENANCE: Social History   Tobacco Use  . Smoking status: Never Smoker  . Smokeless tobacco: Never Used  Substance Use Topics  . Alcohol use: Never    Frequency: Never  . Drug use: Never     Colonoscopy: UTD; last year (2018)  PAP:  Bone density:    Allergies  Allergen Reactions  . Prednisone Swelling and Other (See Comments)    " I think it was this that made my tongue swell "    Current Outpatient Medications  Medication Sig Dispense Refill  . HYDROcodone-acetaminophen (NORCO) 5-325 MG tablet Take 1-2 tablets by mouth  every 6 (six) hours as needed for moderate pain or severe pain. 20 tablet 0  . lidocaine-prilocaine (EMLA) cream Apply to affected area once 30 g 3  . naproxen sodium (ALEVE) 220 MG tablet Take 220 mg by mouth daily as needed (for pain).    . ondansetron (ZOFRAN) 8 MG tablet Take 1 tablet (8 mg total) by mouth 2 (two) times daily as needed for refractory nausea / vomiting. Start on day 3 after chemo. 30 tablet 1  . prochlorperazine (COMPAZINE) 10 MG tablet Take 1 tablet (10 mg total) by mouth every 6 (six) hours as needed (Nausea or vomiting). 30 tablet 1   No current facility-administered medications for this visit.     OBJECTIVE: See CHL for vitals from infusion room There were no vitals filed for this visit.   There is no height or weight on file to calculate BMI.   Wt Readings from Last 3 Encounters:  03/04/18 178 lb 12 oz (81.1 kg)  02/22/18 180 lb (81.6 kg)  02/15/18 176 lb (79.8 kg)  ECOG FS:1 - Symptomatic but completely ambulatory GENERAL:  Patient is a well appearing female in no acute distress HEENT:  Sclerae anicteric.  Oropharynx clear and moist. No ulcerations or evidence of oropharyngeal candidiasis. Neck is supple.  BREAST EXAM:  Deferred. LUNGS:  Clear to auscultation bilaterally.  No wheezes or rhonchi. HEART:  Regular rate and rhythm. No murmur appreciated. ABDOMEN:  Soft, nontender.  Positive, normoactive bowel sounds. No organomegaly palpated. MSK:  No focal spinal tenderness to palpation. Full range of motion bilaterally in the upper extremities. EXTREMITIES:  No peripheral edema.   SKIN:  Clear with no obvious rashes or skin changes. No nail dyscrasia. NEURO:  Nonfocal. Well oriented.  Appropriate affect.    LAB RESULTS:  CMP     Component Value Date/Time   NA 140 03/04/2018 0733   K 4.4 03/04/2018 0733   CL 108 03/04/2018 0733   CO2 25 03/04/2018 0733   GLUCOSE 81 03/04/2018 0733   BUN 11 03/04/2018 0733   CREATININE 0.82 03/04/2018 0733   CREATININE  0.80 01/11/2018 0747   CALCIUM 9.2 03/04/2018 0733   PROT 7.0 03/04/2018 0733   ALBUMIN 3.8 03/04/2018 0733   AST 21 03/04/2018 0733   AST 17 01/11/2018 0747   ALT 20 03/04/2018 0733   ALT 16 01/11/2018 0747   ALKPHOS 61 03/04/2018 0733   BILITOT <0.2 (L) 03/04/2018 0733   BILITOT 0.6 01/11/2018 0747   GFRNONAA >60 03/04/2018 0733   GFRNONAA >60 01/11/2018 0747   GFRAA >60 03/04/2018 0733   GFRAA >60 01/11/2018 0747    No results found for: TOTALPROTELP, ALBUMINELP, A1GS, A2GS, BETS, BETA2SER, GAMS, MSPIKE, SPEI  No results found for: KPAFRELGTCHN, LAMBDASER, [facility]  Lab Results  Component Value Date   WBC 2.2 (L) 03/04/2018   NEUTROABS 1.0 (L) 03/04/2018   HGB 10.3 (L) 03/04/2018   HCT 30.1 (L) 03/04/2018   MCV 98.6 03/04/2018   PLT 252 03/04/2018    @LASTCHEMISTRY @  No results found for: LABCA2  No components found for: XBLTJQ300  No results for input(s): INR in the last 168 hours.  No results found for: LABCA2  No results found for: PQZ300  No results found for: TMA263  No results found for: FHL456  No results found for: CA2729  No components found for: HGQUANT  No results found for: CEA1 / No results found for: CEA1   No results found for: AFPTUMOR  No results found for: CHROMOGRNA  No results found for: PSA1  Appointment on 03/04/2018  Component Date Value Ref Range Status  . Sodium 03/04/2018 140  136 - 145 mmol/L Final  . Potassium 03/04/2018 4.4  3.5 - 5.1 mmol/L Final  . Chloride 03/04/2018 108  98 - 109 mmol/L Final  . CO2 03/04/2018 25  22 - 29 mmol/L Final  . Glucose, Bld 03/04/2018 81  70 - 140 mg/dL Final  . BUN 03/04/2018 11  7 - 26 mg/dL Final  . Creatinine, Ser 03/04/2018 0.82  0.60 - 1.10 mg/dL Final  . Calcium 03/04/2018 9.2  8.4 - 10.4 mg/dL Final  . Total Protein 03/04/2018 7.0  6.4 - 8.3 g/dL Final  . Albumin 03/04/2018 3.8  3.5 - 5.0 g/dL Final  . AST 03/04/2018 21  5 - 34 U/L Final  . ALT 03/04/2018 20  0 - 55 U/L  Final  . Alkaline Phosphatase 03/04/2018 61  40 - 150 U/L Final  . Total Bilirubin 03/04/2018 <0.2* 0.2 - 1.2 mg/dL Final  . GFR calc non Af Amer 03/04/2018 >60  >60 mL/min  Final  . GFR calc Af Amer 03/04/2018 >60  >60 mL/min Final   Comment: (NOTE) The eGFR has been calculated using the CKD EPI equation. This calculation has not been validated in all clinical situations. eGFR's persistently <60 mL/min signify possible Chronic Kidney Disease.   [place] gap 03/04/2018 7  3 - 11 Final   Performed at [facility], [place] [address], [place], [place] [address]  . WBC 03/04/2018 2.2* 3.9 - 10.3 K/uL Final  . RBC 03/04/2018 3.06* 3.70 - 5.45 MIL/uL Final  . Hemoglobin 03/04/2018 10.3* 11.6 - 15.9 g/dL Final  . HCT 03/04/2018 30.1* 34.8 - 46.6 % Final  . MCV 03/04/2018 98.6  79.5 - 101.0 fL Final  . MCH 03/04/2018 33.6  25.1 - 34.0 pg Final  . MCHC 03/04/2018 34.1  31.5 - 36.0 g/dL Final  . RDW 03/04/2018 18.5* 11.2 - 14.5 % Final  . Platelets 03/04/2018 252  145 - 400 K/uL Final  . Neutrophils Relative % 03/04/2018 44  % Final  . Neutro Abs 03/04/2018 1.0* 1.5 - 6.5 K/uL Final  . Lymphocytes Relative 03/04/2018 36  % Final  . Lymphs Abs 03/04/2018 0.8* 0.9 - 3.3 K/uL Final  . Monocytes Relative 03/04/2018 18  % Final  . Monocytes Absolute 03/04/2018 0.4  0.1 - 0.9 K/uL Final  . Eosinophils Relative 03/04/2018 1  % Final  . Eosinophils Absolute 03/04/2018 0.0  0.0 - 0.5 K/uL Final  . Basophils Relative 03/04/2018 1  % Final  . Basophils Absolute 03/04/2018 0.0  0.0 - 0.1 K/uL Final   Performed at [facility], [place] [address] [place], [place] [address]    (this displays the last labs from the last 3 days)  No results found for: TOTALPROTELP, ALBUMINELP, A1GS, A2GS, BETS, BETA2SER, GAMS, MSPIKE, SPEI (this displays SPEP labs)  No results found for: KPAFRELGTCHN, LAMBDASER, KAPLAMBRATIO (kappa/lambda light chains)  No results found for:  HGBA, HGBA2QUANT, HGBFQUANT, HGBSQUAN (Hemoglobinopathy evaluation)   No results found for: LDH  No results found for: IRON, TIBC, IRONPCTSAT (Iron and TIBC)  No results found for: FERRITIN  Urinalysis No results found for: COLORURINE, APPEARANCEUR, LABSPEC, PHURINE, GLUCOSEU, HGBUR, BILIRUBINUR, KETONESUR, PROTEINUR, UROBILINOGEN, NITRITE, LEUKOCYTESUR   STUDIES: No results found.  ELIGIBLE FOR AVAILABLE RESEARCH PROTOCOL: Consider SWOG [id number] post-op  ASSESSMENT: 72 y.o. [place], [place] status post right breast upper outer quadrant biopsy 11/25/2017 for a clinical T2 N2, stage IIIc invasive ductal carcinoma, grade 3, triple negative, with an MIB-1 of 80%.  (1) staging studies:  (a) chest CT 12/10/2017 scan showed no visceral metastatic disease  (b) bone scan 12/10/2017 showed uptake at T11, with sclerosis.  (c) CA-27-29 on 12/05/2017 was 19.0  (d) spinal MRI in [place] reportedly showed only arthrtis at T11  (2) neoadjuvant chemotherapy consisting of carboplatin/ paclitaxel x 12 starting 01/04/2018, to be followed by cytoxan/ adriamycin x 4  (3) definitive surgery to follow  (4) adjuvant radiation as appropriate   PLAN: Veronica Price is feeling moderately well.  Today, in regards to her CBC, she is on the borderline to treat with her [place] of 1.  However, she has peripheral neuropathy that began this morning and has remained constant.  I reviewed this with Dr. Lindi Adie.  She will not receive treatment today.  We will hold treatment and she will return on 03/15/18 for her next cycle of treatment.  [place] was disappointed, however I did reassure her that I would review this with Dr.  magrinat when he returns tomorrow.    They know to call for any other issues that may develop before her next visit.  A total of (20) minutes of face-to-face time was spent with this patient with greater than 50% of that time in counseling and care-coordination.   Wilber Bihari, NP  03/04/18 9:21  PM Medical Oncology and Hematology [facility] [address] [place], [place] [address] Tel. [phone]    Fax. [phone]

## 2018-03-04 NOTE — Patient Instructions (Signed)
Neutropenia Neutropenia is a condition that occurs when you have a lower-than-normal level of a type of white blood cell (neutrophil) in your body. Neutrophils are made in the spongy center of large bones (bone marrow) and they fight infections. Neutrophils are your body's main defense against bacterial and fungal infections. The fewer neutrophils you have and the longer your body remains without them, the greater your risk of getting a severe infection. What are the causes? This condition can occur if your body uses up or destroys neutrophils faster than your bone marrow can make them. This problem may happen because of:  Bacterial or fungal infection.  Allergic disorders.  Reactions to some medicines.  Autoimmune disease.  An enlarged spleen.  This condition can also occur if your bone marrow does not produce enough neutrophils. This problem may be caused by:  Cancer.  Cancer treatments, such as radiation or chemotherapy.  Viral infections.  Medicines, such as phenytoin.  Vitamin B12 deficiency.  Diseases of the bone marrow.  Environmental toxins, such as insecticides.  What are the signs or symptoms? This condition does not usually cause symptoms. If symptoms are present, they are usually caused by an underlying infection. Symptoms of an infection may include:  Fever.  Chills.  Swollen glands.  Oral or anal ulcers.  Cough and shortness of breath.  Rash.  Skin infection.  Fatigue.  How is this diagnosed? Your health care provider may suspect neutropenia if you have:  A condition that may cause neutropenia.  Symptoms of infection, especially fever.  Frequent and unusual infections.  You will have a medical history and physical exam. Tests will also be done, such as:  A complete blood count (CBC).  A procedure to collect a sample of bone marrow for examination (bone marrow biopsy).  A chest X-ray.  A urine culture.  A blood culture.  How is this  treated? Treatment depends on the underlying cause and severity of your condition. Mild neutropenia may not require treatment. Treatment may include medicines, such as:  Antibiotic medicine given through an IV tube.  Antiviral medicines.  Antifungal medicines.  A medicine to increase neutrophil production (colony-stimulating factor). You may get this drug through an IV tube or by injection.  Steroids given through an IV tube.  If an underlying condition is causing neutropenia, you may need treatment for that condition. If medicines you are taking are causing neutropenia, your health care provider may have you stop taking those medicines. Follow these instructions at home: Medicines  Take over-the-counter and prescription medicines only as told by your health care provider.  Get a seasonal flu shot (influenza vaccine). Lifestyle  Do not eat unpasteurized foods.Do not eat unwashed raw fruits or vegetables.  Avoid exposure to groups of people or children.  Avoid being around people who are sick.  Avoid being around dirt or dust, such as in construction areas or gardens.  Do not provide direct care for pets. Avoid animal droppings. Do not clean litter boxes and bird cages. Hygiene   Bathe daily.  Clean the area between the genitals and the anus (perineal area) after you urinate or have a bowel movement. If you are female, wipe from front to back.  Brush your teeth with a soft toothbrush before and after meals.  Do not use a razor that has a blade. Use an electric razor to remove hair.  Wash your hands often. Make sure others who come in contact with you also wash their hands. If soap and water  are not available, use hand sanitizer. General instructions  Do not have sex unless your health care provider has approved.  Take actions to avoid cuts and burns. For example: ? Be cautious when you use knives. Always cut away from yourself. ? Keep knives in protective sheaths or  guards when not in use. ? Use oven mitts when you cook with a hot stove, oven, or grill. ? Stand a safe distance away from open fires.  Avoid people who received a vaccine in the past 30 days if that vaccine contained a live version of the germ (live vaccine). You should not get a live vaccine. Common live vaccines are varicella, measles, mumps, and rubella.  Do not share food utensils.  Do not use tampons, enemas, or rectal suppositories unless your health care provider has approved.  Keep all appointments as told by your health care provider. This is important. Contact a health care provider if:  You have a fever.  You have chills or you start to shake.  You have: ? A sore throat. ? A warm, red, or tender area on your skin. ? A cough. ? Frequent or painful urination. ? Vaginal discharge or itching.  You develop: ? Sores in your mouth or anus. ? Swollen lymph nodes. ? Red streaks on the skin. ? A rash.  You feel: ? Nauseous or you vomit. ? Very fatigued. ? Short of breath. This information is not intended to replace advice given to you by your health care provider. Make sure you discuss any questions you have with your health care provider. Document Released: 03/14/2002 Document Revised: 02/28/2016 Document Reviewed: 04/04/2015 Elsevier Interactive Patient Education  2018 [employer] Inc.  

## 2018-03-05 ENCOUNTER — Telehealth: Payer: Self-pay | Admitting: Adult Health

## 2018-03-05 ENCOUNTER — Ambulatory Visit: Payer: Medicare Other

## 2018-03-05 ENCOUNTER — Other Ambulatory Visit: Payer: Medicare Other

## 2018-03-05 ENCOUNTER — Ambulatory Visit: Payer: Medicare Other | Admitting: Adult Health

## 2018-03-05 NOTE — Telephone Encounter (Signed)
No 5/30 los/orders

## 2018-03-11 ENCOUNTER — Ambulatory Visit: Payer: Medicare Other | Admitting: Adult Health

## 2018-03-11 ENCOUNTER — Other Ambulatory Visit: Payer: Medicare Other

## 2018-03-11 ENCOUNTER — Ambulatory Visit: Payer: Medicare Other

## 2018-03-15 ENCOUNTER — Encounter: Payer: Self-pay | Admitting: Adult Health

## 2018-03-15 ENCOUNTER — Inpatient Hospital Stay: Payer: Medicare Other | Attending: Nurse Practitioner

## 2018-03-15 ENCOUNTER — Other Ambulatory Visit: Payer: Medicare Other

## 2018-03-15 ENCOUNTER — Inpatient Hospital Stay: Payer: Medicare Other

## 2018-03-15 ENCOUNTER — Inpatient Hospital Stay (HOSPITAL_BASED_OUTPATIENT_CLINIC_OR_DEPARTMENT_OTHER): Payer: Medicare Other | Admitting: Adult Health

## 2018-03-15 ENCOUNTER — Ambulatory Visit: Payer: Medicare Other

## 2018-03-15 ENCOUNTER — Ambulatory Visit: Payer: Medicare Other | Admitting: Adult Health

## 2018-03-15 VITALS — BP 161/78 | HR 73 | Temp 98.0°F | Resp 18 | Ht 62.0 in | Wt 174.2 lb

## 2018-03-15 DIAGNOSIS — Z5189 Encounter for other specified aftercare: Secondary | ICD-10-CM | POA: Diagnosis not present

## 2018-03-15 DIAGNOSIS — Z5111 Encounter for antineoplastic chemotherapy: Secondary | ICD-10-CM | POA: Insufficient documentation

## 2018-03-15 DIAGNOSIS — G620 Drug-induced polyneuropathy: Secondary | ICD-10-CM | POA: Insufficient documentation

## 2018-03-15 DIAGNOSIS — C50411 Malignant neoplasm of upper-outer quadrant of right female breast: Secondary | ICD-10-CM

## 2018-03-15 DIAGNOSIS — Z171 Estrogen receptor negative status [ER-]: Principal | ICD-10-CM

## 2018-03-15 DIAGNOSIS — Z95828 Presence of other vascular implants and grafts: Secondary | ICD-10-CM

## 2018-03-15 LAB — COMPREHENSIVE METABOLIC PANEL
ALBUMIN: 3.8 g/dL (ref 3.5–5.0)
ALT: 13 U/L (ref 0–55)
AST: 18 U/L (ref 5–34)
Alkaline Phosphatase: 63 U/L (ref 40–150)
Anion gap: 10 (ref 3–11)
BUN: 12 mg/dL (ref 7–26)
CHLORIDE: 106 mmol/L (ref 98–109)
CO2: 24 mmol/L (ref 22–29)
CREATININE: 0.79 mg/dL (ref 0.60–1.10)
Calcium: 9.1 mg/dL (ref 8.4–10.4)
GFR calc Af Amer: >60 mL/min (ref >60)
GFR calc non Af Amer: >60 mL/min (ref >60)
Glucose, Bld: 97 mg/dL (ref 70–140)
POTASSIUM: 3.7 mmol/L (ref 3.5–5.1)
SODIUM: 140 mmol/L (ref 136–145)
Total Bilirubin: 0.4 mg/dL (ref 0.2–1.2)
Total Protein: 7.3 g/dL (ref 6.4–8.3)

## 2018-03-15 LAB — CBC WITH DIFFERENTIAL/PLATELET
Basophils Absolute: 0 10*3/uL (ref 0.0–0.1)
Basophils Relative: 1 %
EOS PCT: 1 %
Eosinophils Absolute: 0.1 10*3/uL (ref 0.0–0.5)
HCT: 33.4 % — ABNORMAL LOW (ref 34.8–46.6)
Hemoglobin: 11.1 g/dL — ABNORMAL LOW (ref 11.6–15.9)
LYMPHS ABS: 1.2 10*3/uL (ref 0.9–3.3)
Lymphocytes Relative: 24 %
MCH: 32.8 pg (ref 25.1–34.0)
MCHC: 33.3 g/dL (ref 31.5–36.0)
MCV: 98.5 fL (ref 79.5–101.0)
MONOS PCT: 14 %
Monocytes Absolute: 0.7 10*3/uL (ref 0.1–0.9)
Neutro Abs: 2.9 10*3/uL (ref 1.5–6.5)
Neutrophils Relative %: 60 %
PLATELETS: 248 10*3/uL (ref 145–400)
RBC: 3.39 MIL/uL — AB (ref 3.70–5.45)
RDW: 18.1 % — ABNORMAL HIGH (ref 11.2–14.5)
WBC: 4.8 10*3/uL (ref 3.9–10.3)

## 2018-03-15 MED ORDER — PALONOSETRON HCL INJECTION 0.25 MG/5ML
INTRAVENOUS | Status: AC
  Filled 2018-03-15: qty 5

## 2018-03-15 MED ORDER — SODIUM CHLORIDE 0.9 % IV SOLN
179.2000 mg | Freq: Once | INTRAVENOUS | Status: AC
  Administered 2018-03-15: 180 mg via INTRAVENOUS
  Filled 2018-03-15: qty 18

## 2018-03-15 MED ORDER — SODIUM CHLORIDE 0.9 % IV SOLN
Freq: Once | INTRAVENOUS | Status: AC
  Administered 2018-03-15: 10:00:00 via INTRAVENOUS

## 2018-03-15 MED ORDER — SODIUM CHLORIDE 0.9% FLUSH
10.0000 mL | Freq: Once | INTRAVENOUS | Status: AC
  Administered 2018-03-15: 10 mL
  Filled 2018-03-15: qty 10

## 2018-03-15 MED ORDER — SODIUM CHLORIDE 0.9% FLUSH
10.0000 mL | INTRAVENOUS | Status: DC | PRN
  Administered 2018-03-15: 10 mL
  Filled 2018-03-15: qty 10

## 2018-03-15 MED ORDER — DEXAMETHASONE SODIUM PHOSPHATE 10 MG/ML IJ SOLN
INTRAMUSCULAR | Status: AC
  Filled 2018-03-15: qty 1

## 2018-03-15 MED ORDER — DEXAMETHASONE SODIUM PHOSPHATE 10 MG/ML IJ SOLN
4.0000 mg | Freq: Once | INTRAMUSCULAR | Status: AC
  Administered 2018-03-15: 4 mg via INTRAVENOUS

## 2018-03-15 MED ORDER — PALONOSETRON HCL INJECTION 0.25 MG/5ML
0.2500 mg | Freq: Once | INTRAVENOUS | Status: AC
  Administered 2018-03-15: 0.25 mg via INTRAVENOUS

## 2018-03-15 MED ORDER — HEPARIN SOD (PORK) LOCK FLUSH 100 UNIT/ML IV SOLN
500.0000 [IU] | Freq: Once | INTRAVENOUS | Status: AC | PRN
  Administered 2018-03-15: 500 [IU]
  Filled 2018-03-15: qty 5

## 2018-03-15 MED ORDER — SODIUM CHLORIDE 0.9 % IV SOLN
800.0000 mg/m2 | Freq: Once | INTRAVENOUS | Status: AC
  Administered 2018-03-15: 1520 mg via INTRAVENOUS
  Filled 2018-03-15: qty 39.98

## 2018-03-15 NOTE — Patient Instructions (Signed)
[facility] Discharge Instructions for Patients Receiving Chemotherapy  Today you received the following chemotherapy agents: Carbo, Gemzar  To help prevent nausea and vomiting after your treatment, we encourage you to take your nausea medication as directed.   If you develop nausea and vomiting that is not controlled by your nausea medication, call the clinic.   BELOW ARE SYMPTOMS THAT SHOULD BE REPORTED IMMEDIATELY:  *FEVER GREATER THAN 100.5 F  *CHILLS WITH OR WITHOUT FEVER  NAUSEA AND VOMITING THAT IS NOT CONTROLLED WITH YOUR NAUSEA MEDICATION  *UNUSUAL SHORTNESS OF BREATH  *UNUSUAL BRUISING OR BLEEDING  TENDERNESS IN MOUTH AND THROAT WITH OR WITHOUT PRESENCE OF ULCERS  *URINARY PROBLEMS  *BOWEL PROBLEMS  UNUSUAL RASH Items with * indicate a potential emergency and should be followed up as soon as possible.  Feel free to call the clinic should you have any questions or concerns. The clinic phone number is [phone].  Please show the [facility] at check-in to the Emergency Department and triage nurse.  Carboplatin injection What is this medicine? CARBOPLATIN (KAR boe pla tin) is a chemotherapy drug. It targets fast dividing cells, like cancer cells, and causes these cells to die. This medicine is used to treat ovarian cancer and many other cancers. This medicine may be used for other purposes; ask your health care provider or pharmacist if you have questions. COMMON BRAND NAME(S): Paraplatin What should I tell my health care provider before I take this medicine? They need to know if you have any of these conditions: -blood disorders -hearing problems -kidney disease -recent or ongoing radiation therapy -an unusual or allergic reaction to carboplatin, cisplatin, other chemotherapy, other medicines, foods, dyes, or preservatives -pregnant or trying to get pregnant -breast-feeding How should I use this medicine? This drug is usually given as  an infusion into a vein. It is administered in a hospital or clinic by a specially trained health care professional. Talk to your pediatrician regarding the use of this medicine in children. Special care may be needed. Overdosage: If you think you have taken too much of this medicine contact a poison control center or emergency room at once. NOTE: This medicine is only for you. Do not share this medicine with others. What if I miss a dose? It is important not to miss a dose. Call your doctor or health care professional if you are unable to keep an appointment. What may interact with this medicine? -medicines for seizures -medicines to increase blood counts like filgrastim, pegfilgrastim, sargramostim -some antibiotics like amikacin, gentamicin, neomycin, streptomycin, tobramycin -vaccines Talk to your doctor or health care professional before taking any of these medicines: -acetaminophen -aspirin -ibuprofen -ketoprofen -naproxen This list may not describe all possible interactions. Give your health care provider a list of all the medicines, herbs, non-prescription drugs, or dietary supplements you use. Also tell them if you smoke, drink alcohol, or use illegal drugs. Some items may interact with your medicine. What should I watch for while using this medicine? Your condition will be monitored carefully while you are receiving this medicine. You will need important blood work done while you are taking this medicine. This drug may make you feel generally unwell. This is not uncommon, as chemotherapy can affect healthy cells as well as cancer cells. Report any side effects. Continue your course of treatment even though you feel ill unless your doctor tells you to stop. In some cases, you may be given additional medicines to help with side effects. Follow  all directions for their use. Call your doctor or health care professional for advice if you get a fever, chills or sore throat, or other  symptoms of a cold or flu. Do not treat yourself. This drug decreases your body's ability to fight infections. Try to avoid being around people who are sick. This medicine may increase your risk to bruise or bleed. Call your doctor or health care professional if you notice any unusual bleeding. Be careful brushing and flossing your teeth or using a toothpick because you may get an infection or bleed more easily. If you have any dental work done, tell your dentist you are receiving this medicine. Avoid taking products that contain aspirin, acetaminophen, ibuprofen, naproxen, or ketoprofen unless instructed by your doctor. These medicines may hide a fever. Do not become pregnant while taking this medicine. Women should inform their doctor if they wish to become pregnant or think they might be pregnant. There is a potential for serious side effects to an unborn child. Talk to your health care professional or pharmacist for more information. Do not breast-feed an infant while taking this medicine. What side effects may I notice from receiving this medicine? Side effects that you should report to your doctor or health care professional as soon as possible: -allergic reactions like skin rash, itching or hives, swelling of the face, lips, or tongue -signs of infection - fever or chills, cough, sore throat, pain or difficulty passing urine -signs of decreased platelets or bleeding - bruising, pinpoint red spots on the skin, black, tarry stools, nosebleeds -signs of decreased red blood cells - unusually weak or tired, fainting spells, lightheadedness -breathing problems -changes in hearing -changes in vision -chest pain -high blood pressure -low blood counts - This drug may decrease the number of white blood cells, red blood cells and platelets. You may be at increased risk for infections and bleeding. -nausea and vomiting -pain, swelling, redness or irritation at the injection site -pain, tingling,  numbness in the hands or feet -problems with balance, talking, walking -trouble passing urine or change in the amount of urine Side effects that usually do not require medical attention (report to your doctor or health care professional if they continue or are bothersome): -hair loss -loss of appetite -metallic taste in the mouth or changes in taste This list may not describe all possible side effects. Call your doctor for medical advice about side effects. You may report side effects to FDA at 1-800-FDA-1088. Where should I keep my medicine? This drug is given in a hospital or clinic and will not be stored at home. NOTE: This sheet is a summary. It may not cover all possible information. If you have questions about this medicine, talk to your doctor, pharmacist, or health care provider.  2018 [employer]/Gold Standard (2007-12-28 14:38:05)  Gemcitabine injection What is this medicine? GEMCITABINE (jem SIT a been) is a chemotherapy drug. This medicine is used to treat many types of cancer like breast cancer, lung cancer, pancreatic cancer, and ovarian cancer. This medicine may be used for other purposes; ask your health care provider or pharmacist if you have questions. COMMON BRAND NAME(S): Gemzar What should I tell my health care provider before I take this medicine? They need to know if you have any of these conditions: -blood disorders -infection -kidney disease -liver disease -recent or ongoing radiation therapy -an unusual or allergic reaction to gemcitabine, other chemotherapy, other medicines, foods, dyes, or preservatives -pregnant or trying to get pregnant -breast-feeding How should I  use this medicine? This drug is given as an infusion into a vein. It is administered in a hospital or clinic by a specially trained health care professional. Talk to your pediatrician regarding the use of this medicine in children. Special care may be needed. Overdosage: If you think you have  taken too much of this medicine contact a poison control center or emergency room at once. NOTE: This medicine is only for you. Do not share this medicine with others. What if I miss a dose? It is important not to miss your dose. Call your doctor or health care professional if you are unable to keep an appointment. What may interact with this medicine? -medicines to increase blood counts like filgrastim, pegfilgrastim, sargramostim -some other chemotherapy drugs like cisplatin -vaccines Talk to your doctor or health care professional before taking any of these medicines: -acetaminophen -aspirin -ibuprofen -ketoprofen -naproxen This list may not describe all possible interactions. Give your health care provider a list of all the medicines, herbs, non-prescription drugs, or dietary supplements you use. Also tell them if you smoke, drink alcohol, or use illegal drugs. Some items may interact with your medicine. What should I watch for while using this medicine? Visit your doctor for checks on your progress. This drug may make you feel generally unwell. This is not uncommon, as chemotherapy can affect healthy cells as well as cancer cells. Report any side effects. Continue your course of treatment even though you feel ill unless your doctor tells you to stop. In some cases, you may be given additional medicines to help with side effects. Follow all directions for their use. Call your doctor or health care professional for advice if you get a fever, chills or sore throat, or other symptoms of a cold or flu. Do not treat yourself. This drug decreases your body's ability to fight infections. Try to avoid being around people who are sick. This medicine may increase your risk to bruise or bleed. Call your doctor or health care professional if you notice any unusual bleeding. Be careful brushing and flossing your teeth or using a toothpick because you may get an infection or bleed more easily. If you have  any dental work done, tell your dentist you are receiving this medicine. Avoid taking products that contain aspirin, acetaminophen, ibuprofen, naproxen, or ketoprofen unless instructed by your doctor. These medicines may hide a fever. Women should inform their doctor if they wish to become pregnant or think they might be pregnant. There is a potential for serious side effects to an unborn child. Talk to your health care professional or pharmacist for more information. Do not breast-feed an infant while taking this medicine. What side effects may I notice from receiving this medicine? Side effects that you should report to your doctor or health care professional as soon as possible: -allergic reactions like skin rash, itching or hives, swelling of the face, lips, or tongue -low blood counts - this medicine may decrease the number of white blood cells, red blood cells and platelets. You may be at increased risk for infections and bleeding. -signs of infection - fever or chills, cough, sore throat, pain or difficulty passing urine -signs of decreased platelets or bleeding - bruising, pinpoint red spots on the skin, black, tarry stools, blood in the urine -signs of decreased red blood cells - unusually weak or tired, fainting spells, lightheadedness -breathing problems -chest pain -mouth sores -nausea and vomiting -pain, swelling, redness at site where injected -pain, tingling, numbness in the  hands or feet -stomach pain -swelling of ankles, feet, hands -unusual bleeding Side effects that usually do not require medical attention (report to your doctor or health care professional if they continue or are bothersome): -constipation -diarrhea -hair loss -loss of appetite -stomach upset This list may not describe all possible side effects. Call your doctor for medical advice about side effects. You may report side effects to FDA at 1-800-FDA-1088. Where should I keep my medicine? This drug is given  in a hospital or clinic and will not be stored at home. NOTE: This sheet is a summary. It may not cover all possible information. If you have questions about this medicine, talk to your doctor, pharmacist, or health care provider.  2018 [employer]vier/Gold Standard (2008-02-01 18:45:54)

## 2018-03-15 NOTE — Progress Notes (Signed)
I s/w Dr. Jana Hakim re: tx change from Taxol --> Gemzar. Premeds adjusted accordingly also per Dr. Jana Hakim. Kennith Center, Pharm.D., CPP 03/15/2018@9 :31 AM

## 2018-03-15 NOTE — Progress Notes (Addendum)
Veronica Price  [phone] [phone]     ID: Veronica Price DOB: [birth year]  MR#: [id number]  ZJQ#:[id number]  Patient Care Team: [name], MD as PCP - General [name], [name], MD as Consulting Physician (Oncology) [name], [name], MD as Consulting Physician (Oncology) [name], MD as Consulting Physician (General Surgery) OTHER MD:  CHIEF COMPLAINT: Triple negative breast cancer  CURRENT TREATMENT: Neoadjuvant chemotherapy    INTERVAL HISTORY: Veronica Price returns today for follow up and treatment of her triple negative breast cancer, accompanied by her daughter.  She is currently undergoing neoadjuvant chemotherapy with weekly paclitaxel/carboplatin, to be followed by cyclophosphamide/doxorubicin in dose dense fashion x4. Today is day 1 cycle 9.     REVIEW OF SYSTEMS: Veronica Price is here today to receive her weekly Paclitaxel and Carboplatin. 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    Veronica Price says that her numbness is improved.  It is no longer in her fingertips.  She still has some in her feet bilaterally.  Her [place] has improved today and is normal.  She denies any other issues today.  A detailed ROS was otherwise non contributory today.    HISTORY OF CURRENT ILLNESS: Veronica Price initially palpated a mass to her right breast in February 2019.   She brought her to her primary care physician's attention and was set up for bilateral diagnostic mammography with tomography and right breast ultrasonography on 11/25/2017 showing: a 3.2 x 3.7 cm lobulated mass in the UOQ of the right breast; there was a 3 cm calcified retroareolar mass in the left breast. On right breast ultrasonography there was a complex mostly solid 2.4 x 3.1 cm mass at 10 o'clock and multiple enlarged right  axillary lymph nodes, the largest measuring 1.6 x 2.4.  Accordingly on 11/27/2017 the patient proceeded to biopsy of the right breast area in question, as well as an attempted axillary node biopsy. The pathology from this procedure showed ([id number]):  high grade infiltrating ductal carcinoma with no lymphovascular invasion.  The attempted axillary node biopsy showed fat, no nodal tissue.  Prognostic indicators were: estrogen and progesterone receptor negative,. Proliferation marker Ki67 at >80%. HER2 negative by immunohistochemistry at 0%  CT scans of the chest abdomen and pelvis completed on 12/10/2017 showed A complex solid and cystic 3.5 cm right breast mass with several bilateral axillary lymph nodes, the largest in the right axilla measured 12 mm. There was no evidence of visceral metastatic disease.   She had a bone scan on 12/10/2017 that showed two areas of increased uptake overlying the lateral aspects of the T11 vertebrae. Review of the CT scan of the chest revealed sclerosis of the T11 pedicles concerning for bone metastases.  However subsequent spinal MRI (I do not have that report) read this out as arthritis, not a metastatic deposit  The patient had a echocardiogram completed on 3/72019 with results showing: left ventricular ejection fraction of 55-60% with mild concentric hypertrophy. There was mild to moderate mitral and mild tricuspid regurgitation with mild pulmonary hypertension. The aortic valve was mildly thickened with trace aortic regurgitation.   The patient's subsequent history is as detailed below.  PAST MEDICAL HISTORY: Past Medical History:  Diagnosis Date  . Arthritis   . Breast cancer ([place])    Right breast  . Cataract    "  early" per patient  . GERD (gastroesophageal reflux disease)    occasionaly tums    PAST SURGICAL HISTORY: Past Surgical History:  Procedure Laterality Date  . ABDOMINAL HYSTERECTOMY     Total hysterectomy in 2002  . PORTACATH PLACEMENT N/A  12/29/2017   Procedure: INSERTION PORT-A-CATH LEFT SUBCLAVIAN;  Surgeon: [name], MD;  Location: [facility];  Service: General;  Laterality: N/A;    FAMILY HISTORY Family History  Problem Relation Age of Onset  . Diabetes Father   . Heart attack Father    She notes that her father died from a possible MI in his early 10's Her mother is still alive and will be 80 March 2019!  The patient has 2 brothers and 2 sisters. Her brother had prostate cancer possibly agent orange related. She has a cousin who was recently diagnosed with stomach cancer. Her maternal grandmother died from colon cancer. She has a maternal uncle with bone cancer. She denies family hx of breast or ovarian cancer.    GYNECOLOGIC HISTORY:  Menarche: 72 years old Age at first live birth: 72 years old [place] P3 LMP: at age 50 Contraceptive: OCP HRT   Hysterectomy? 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 She also worked at [employer]. She lives at home with her husband, Veronica Price who is a retired Building control surveyor, her husband works part time driving an adult day care bus. Her daughter, Veronica Price is a Multimedia programmer. Her daughter Veronica Price lives in [place], [place] and works as a Therapist, sports for the Veronica Price. Her son Veronica Price, lives in [place], and works as a Administrator.  The patient has 3 grandchildren and no great-grandchildren. She is of baptist faith.    ADVANCED DIRECTIVES:    HEALTH MAINTENANCE: Social History   Tobacco Use  . Smoking status: Never Smoker  . Smokeless tobacco: Never Used  Substance Use Topics  . Alcohol use: Never    Frequency: Never  . Drug use: Never     Colonoscopy: UTD; last year (2018)  PAP:  Bone density:    Allergies  Allergen Reactions  . Prednisone Swelling and Other (See Comments)    " I think it was this that made my tongue swell "    Current Outpatient Medications  Medication Sig  Dispense Refill  . HYDROcodone-acetaminophen (NORCO) 5-325 MG tablet Take 1-2 tablets by mouth every 6 (six) hours as needed for moderate pain or severe pain. 20 tablet 0  . lidocaine-prilocaine (EMLA) cream Apply to affected area once 30 g 3  . naproxen sodium (ALEVE) 220 MG tablet Take 220 mg by mouth daily as needed (for pain).    . ondansetron (ZOFRAN) 8 MG tablet Take 1 tablet (8 mg total) by mouth 2 (two) times daily as needed for refractory nausea / vomiting. Start on day 3 after chemo. 30 tablet 1  . prochlorperazine (COMPAZINE) 10 MG tablet Take 1 tablet (10 mg total) by mouth every 6 (six) hours as needed (Nausea or vomiting). 30 tablet 1   No current facility-administered medications for this visit.     OBJECTIVE: Vitals:   03/15/18 0830  BP: (!) 161/78  Pulse: 73  Resp: 18  Temp: 98 F (36.7 C)  SpO2: 99%     Body mass index is 31.86 kg/m.   Wt Readings from Last 3 Encounters:  03/15/18 174 lb 3.2 oz (79 kg)  03/04/18  178 lb 12 oz (81.1 kg)  02/22/18 180 lb (81.6 kg)  ECOG FS:1 - Symptomatic but completely ambulatory GENERAL: Patient is a well appearing female in no acute distress HEENT:  Sclerae anicteric.  Oropharynx clear and moist. No ulcerations or evidence of oropharyngeal candidiasis. Neck is supple.  BREAST EXAM:  Deferred. LUNGS:  Clear to auscultation bilaterally.  No wheezes or rhonchi. HEART:  Regular rate and rhythm. No murmur appreciated. ABDOMEN:  Soft, nontender.  Positive, normoactive bowel sounds. No organomegaly palpated. MSK:  No focal spinal tenderness to palpation. Full range of motion bilaterally in the upper extremities. EXTREMITIES:  No peripheral edema.   SKIN:  Clear with no obvious rashes or skin changes. No nail dyscrasia. NEURO:  Nonfocal. Well oriented.  Appropriate affect.    LAB RESULTS:  CMP     Component Value Date/Time   NA 140 03/15/2018 0810   K 3.7 03/15/2018 0810   CL 106 03/15/2018 0810   CO2 24 03/15/2018 0810    GLUCOSE 97 03/15/2018 0810   BUN 12 03/15/2018 0810   CREATININE 0.79 03/15/2018 0810   CREATININE 0.80 01/11/2018 0747   CALCIUM 9.1 03/15/2018 0810   PROT 7.3 03/15/2018 0810   ALBUMIN 3.8 03/15/2018 0810   AST 18 03/15/2018 0810   AST 17 01/11/2018 0747   ALT 13 03/15/2018 0810   ALT 16 01/11/2018 0747   ALKPHOS 63 03/15/2018 0810   BILITOT 0.4 03/15/2018 0810   BILITOT 0.6 01/11/2018 0747   GFRNONAA >60 03/15/2018 0810   GFRNONAA >60 01/11/2018 0747   GFRAA >60 03/15/2018 0810   GFRAA >60 01/11/2018 0747    No results found for: TOTALPROTELP, ALBUMINELP, A1GS, A2GS, BETS, BETA2SER, GAMS, MSPIKE, SPEI  No results found for: KPAFRELGTCHN, LAMBDASER, KAPLAMBRATIO  Lab Results  Component Value Date   WBC 4.8 03/15/2018   NEUTROABS 2.9 03/15/2018   HGB 11.1 (L) 03/15/2018   HCT 33.4 (L) 03/15/2018   MCV 98.5 03/15/2018   PLT 248 03/15/2018    @LASTCHEMISTRY @  No results found for: LABCA2  No components found for: [id number]  No results for input(s): INR in the last 168 hours.  No results found for: LABCA2  No results found for: YIF027  No results found for: XAJ287  No results found for: OMV672  No results found for: CA2729  No components found for: HGQUANT  No results found for: CEA1 / No results found for: CEA1   No results found for: AFPTUMOR  No results found for: CHROMOGRNA  No results found for: PSA1  Appointment on 03/15/2018  Component Date Value Ref Range Status  . Sodium 03/15/2018 140  136 - 145 mmol/L Final  . Potassium 03/15/2018 3.7  3.5 - 5.1 mmol/L Final  . Chloride 03/15/2018 106  98 - 109 mmol/L Final  . CO2 03/15/2018 24  22 - 29 mmol/L Final  . Glucose, Bld 03/15/2018 97  70 - 140 mg/dL Final  . BUN 03/15/2018 12  7 - 26 mg/dL Final  . Creatinine, Ser 03/15/2018 0.79  0.60 - 1.10 mg/dL Final  . Calcium 03/15/2018 9.1  8.4 - 10.4 mg/dL Final  . Total Protein 03/15/2018 7.3  6.4 - 8.3 g/dL Final  . Albumin 03/15/2018 3.8  3.5 -  5.0 g/dL Final  . AST 03/15/2018 18  5 - 34 U/L Final  . ALT 03/15/2018 13  0 - 55 U/L Final  . Alkaline Phosphatase 03/15/2018 63  40 - 150 U/L Final  . Total Bilirubin 03/15/2018  0.4  0.2 - 1.2 mg/dL Final  . GFR calc non Af Amer 03/15/2018 >60  >60 mL/min Final  . GFR calc Af Amer 03/15/2018 >60  >60 mL/min Final   Comment: (NOTE) The eGFR has been calculated using the CKD EPI equation. This calculation has not been validated in all clinical situations. eGFR's persistently <60 mL/min signify possible Chronic Kidney Disease.   Georgiann Hahn gap 03/15/2018 10  3 - 11 Final   Performed at [facility], [place] [address] [place], [place] [address]  . WBC 03/15/2018 4.8  3.9 - 10.3 K/uL Final  . RBC 03/15/2018 3.39* 3.70 - 5.45 MIL/uL Final  . Hemoglobin 03/15/2018 11.1* 11.6 - 15.9 g/dL Final  . HCT 03/15/2018 33.4* 34.8 - 46.6 % Final  . MCV 03/15/2018 98.5  79.5 - 101.0 fL Final  . MCH 03/15/2018 32.8  25.1 - 34.0 pg Final  . MCHC 03/15/2018 33.3  31.5 - 36.0 g/dL Final  . RDW 03/15/2018 18.1* 11.2 - 14.5 % Final  . Platelets 03/15/2018 248  145 - 400 K/uL Final  . Neutrophils Relative % 03/15/2018 60  % Final  . Neutro Abs 03/15/2018 2.9  1.5 - 6.5 K/uL Final  . Lymphocytes Relative 03/15/2018 24  % Final  . Lymphs Abs 03/15/2018 1.2  0.9 - 3.3 K/uL Final  . Monocytes Relative 03/15/2018 14  % Final  . Monocytes Absolute 03/15/2018 0.7  0.1 - 0.9 K/uL Final  . Eosinophils Relative 03/15/2018 1  % Final  . Eosinophils Absolute 03/15/2018 0.1  0.0 - 0.5 K/uL Final  . Basophils Relative 03/15/2018 1  % Final  . Basophils Absolute 03/15/2018 0.0  0.0 - 0.1 K/uL Final   Performed at [facility], [place] [address], [place], [place] [address]    (this displays the last labs from the last 3 days)  No results found for: TOTALPROTELP, ALBUMINELP, A1GS, A2GS, BETS, BETA2SER, GAMS, MSPIKE, SPEI (this displays SPEP labs)  No results found for:  KPAFRELGTCHN, LAMBDASER, KAPLAMBRATIO (kappa/lambda light chains)  No results found for: HGBA, HGBA2QUANT, HGBFQUANT, HGBSQUAN (Hemoglobinopathy evaluation)   No results found for: LDH  No results found for: IRON, TIBC, IRONPCTSAT (Iron and TIBC)  No results found for: FERRITIN  Urinalysis No results found for: COLORURINE, APPEARANCEUR, LABSPEC, PHURINE, GLUCOSEU, HGBUR, BILIRUBINUR, KETONESUR, PROTEINUR, UROBILINOGEN, NITRITE, LEUKOCYTESUR   STUDIES: No results found.  ELIGIBLE FOR AVAILABLE RESEARCH PROTOCOL: Consider SWOG [phone] post-op  ASSESSMENT: 72 y.o. Veronica Price, Veronica Price woman status post right breast upper outer quadrant biopsy 11/25/2017 for a clinical T2 N2, stage IIIc invasive ductal carcinoma, grade 3, triple negative, with an MIB-1 of 80%.  (1) staging studies:  (a) chest CT 12/10/2017 scan showed no visceral metastatic disease  (b) bone scan 12/10/2017 showed uptake at T11, with sclerosis.  (c) CA-27-29 on 12/05/2017 was 19.0  (d) spinal MRI in [place] reportedly showed only arthrtis at T11  (2) neoadjuvant chemotherapy consisting of carboplatin/ paclitaxel x 12 starting 01/04/2018, to be followed by cytoxan/ adriamycin x 4  (3) definitive surgery to follow  (4) adjuvant radiation as appropriate   PLAN: Veronica Price is doing better today than she was the last time I saw her.  She met with Dr. Jana Hakim today to discuss her current complications during treatment.  Due to her chemotherapy induced peripheral neuropathy, she will no longer receive Paclitaxel.  Instead of Paclitaxel we are now substituting Gemcitabine for cycles 9, 10, 11, and 12.  Dr. Jana Hakim reviewed this  with her in detail, especially regarding the potential for slightly more nausea, and ensured that she has anti-nausea medication at home to help.    I called and spoke with pharmacy, along with Rudolpho Sevin our pre authorization specialist.  Veronica Price is cleared to proceed with both Gemcitabine/Carboplatin today.      We will see Veronica Price back next week for labs, f/u and her next treatment.  They know to call for any other issues that may develop before her next visit.   Wilber Bihari, NP  03/15/18 8:57 AM Medical Oncology and Hematology [facility] [address] [place], [place] [address] Tel. [phone]    Fax. [phone]    ADDENDUM: Veronica Price's neuropathy is still grade 1 but it is persistent and has not completely cleared.  I am hopefully it will completely cleared slowly and eventually, although she understands that there may be some permanent feeling loss.  In any case we cannot continue with taxanes in the situation and we are substituting carboplatin and gemcitabine.  We discussed the possible toxicity side effects and complications of these agents and the appropriate orders have been written.  I have also added additional anti-medics for her to use at home as needed.  I have asked her to keep a diary of symptoms so we can review that when she returns in a week for her second dose of the current treatment.  We are hoping to be able to get the final 4 treatments this way and then start her on cyclophosphamide and doxorubicin.  I think we will need to do those every 3 weeks instead of in dose dense fashion but we will discuss that at that time.  I personally saw this patient and performed a substantive portion of this encounter with the listed APP documented above.   Chauncey Cruel, MD Medical Oncology and Hematology [facility] [address] [place],  [address] Tel. [phone]    Fax. [phone]

## 2018-03-16 ENCOUNTER — Telehealth: Payer: Self-pay | Admitting: Adult Health

## 2018-03-16 NOTE — Telephone Encounter (Signed)
No 6/11 los, orders or referrals.

## 2018-03-17 ENCOUNTER — Other Ambulatory Visit: Payer: Medicare Other

## 2018-03-17 ENCOUNTER — Ambulatory Visit: Payer: Medicare Other | Admitting: Adult Health

## 2018-03-17 ENCOUNTER — Ambulatory Visit: Payer: Medicare Other

## 2018-03-22 ENCOUNTER — Telehealth: Payer: Self-pay

## 2018-03-22 ENCOUNTER — Inpatient Hospital Stay: Payer: Medicare Other

## 2018-03-22 ENCOUNTER — Ambulatory Visit: Payer: Medicare Other

## 2018-03-22 ENCOUNTER — Other Ambulatory Visit: Payer: Medicare Other

## 2018-03-22 ENCOUNTER — Telehealth: Payer: Self-pay | Admitting: Adult Health

## 2018-03-22 ENCOUNTER — Encounter: Payer: Self-pay | Admitting: Adult Health

## 2018-03-22 ENCOUNTER — Ambulatory Visit: Payer: Medicare Other | Admitting: Adult Health

## 2018-03-22 ENCOUNTER — Inpatient Hospital Stay (HOSPITAL_BASED_OUTPATIENT_CLINIC_OR_DEPARTMENT_OTHER): Payer: Medicare Other | Admitting: Adult Health

## 2018-03-22 VITALS — BP 154/75 | HR 83 | Temp 98.2°F | Resp 17 | Ht 62.0 in | Wt 172.8 lb

## 2018-03-22 DIAGNOSIS — Z5111 Encounter for antineoplastic chemotherapy: Secondary | ICD-10-CM

## 2018-03-22 DIAGNOSIS — Z95828 Presence of other vascular implants and grafts: Secondary | ICD-10-CM

## 2018-03-22 DIAGNOSIS — G620 Drug-induced polyneuropathy: Secondary | ICD-10-CM | POA: Diagnosis not present

## 2018-03-22 DIAGNOSIS — Z171 Estrogen receptor negative status [ER-]: Secondary | ICD-10-CM

## 2018-03-22 DIAGNOSIS — D709 Neutropenia, unspecified: Secondary | ICD-10-CM | POA: Diagnosis not present

## 2018-03-22 DIAGNOSIS — C50411 Malignant neoplasm of upper-outer quadrant of right female breast: Secondary | ICD-10-CM

## 2018-03-22 DIAGNOSIS — Z5189 Encounter for other specified aftercare: Secondary | ICD-10-CM | POA: Diagnosis not present

## 2018-03-22 LAB — CBC WITH DIFFERENTIAL/PLATELET
BASOS ABS: 0 10*3/uL (ref 0.0–0.1)
BASOS PCT: 1 %
EOS PCT: 1 %
Eosinophils Absolute: 0 10*3/uL (ref 0.0–0.5)
HEMATOCRIT: 32.5 % — AB (ref 34.8–46.6)
Hemoglobin: 10.7 g/dL — ABNORMAL LOW (ref 11.6–15.9)
Lymphocytes Relative: 57 %
Lymphs Abs: 1 10*3/uL (ref 0.9–3.3)
MCH: 32.8 pg (ref 25.1–34.0)
MCHC: 32.8 g/dL (ref 31.5–36.0)
MCV: 100 fL (ref 79.5–101.0)
Monocytes Absolute: 0.2 10*3/uL (ref 0.1–0.9)
Monocytes Relative: 12 %
NEUTROS ABS: 0.5 10*3/uL — AB (ref 1.5–6.5)
Neutrophils Relative %: 29 %
PLATELETS: 150 10*3/uL (ref 145–400)
RBC: 3.25 MIL/uL — AB (ref 3.70–5.45)
RDW: 17.5 % — AB (ref 11.2–14.5)
WBC: 1.8 10*3/uL — AB (ref 3.9–10.3)

## 2018-03-22 LAB — COMPREHENSIVE METABOLIC PANEL
ALBUMIN: 3.8 g/dL (ref 3.5–5.0)
ALT: 30 U/L (ref 0–55)
AST: 21 U/L (ref 5–34)
Alkaline Phosphatase: 67 U/L (ref 40–150)
Anion gap: 7 (ref 3–11)
BUN: 13 mg/dL (ref 7–26)
CHLORIDE: 106 mmol/L (ref 98–109)
CO2: 28 mmol/L (ref 22–29)
CREATININE: 0.84 mg/dL (ref 0.60–1.10)
Calcium: 9.3 mg/dL (ref 8.4–10.4)
GFR calc Af Amer: >60 mL/min (ref >60)
GLUCOSE: 125 mg/dL (ref 70–140)
POTASSIUM: 3.8 mmol/L (ref 3.5–5.1)
Sodium: 141 mmol/L (ref 136–145)
Total Bilirubin: 0.4 mg/dL (ref 0.2–1.2)
Total Protein: 7.2 g/dL (ref 6.4–8.3)

## 2018-03-22 MED ORDER — SODIUM CHLORIDE 0.9% FLUSH
10.0000 mL | Freq: Once | INTRAVENOUS | Status: AC
  Administered 2018-03-22: 10 mL
  Filled 2018-03-22: qty 10

## 2018-03-22 MED ORDER — PEGFILGRASTIM-CBQV 6 MG/0.6ML ~~LOC~~ SOSY
6.0000 mg | PREFILLED_SYRINGE | Freq: Once | SUBCUTANEOUS | Status: AC
  Administered 2018-03-22: 6 mg via SUBCUTANEOUS

## 2018-03-22 MED ORDER — PEGFILGRASTIM-CBQV 6 MG/0.6ML ~~LOC~~ SOSY
PREFILLED_SYRINGE | SUBCUTANEOUS | Status: AC
  Filled 2018-03-22: qty 0.6

## 2018-03-22 MED ORDER — HEPARIN SOD (PORK) LOCK FLUSH 100 UNIT/ML IV SOLN
500.0000 [IU] | Freq: Once | INTRAVENOUS | Status: AC
  Administered 2018-03-22: 500 [IU]
  Filled 2018-03-22: qty 5

## 2018-03-22 NOTE — Telephone Encounter (Signed)
Per Rudolpho Sevin in [facility], pt has been approved for Granix injections.

## 2018-03-22 NOTE — Telephone Encounter (Signed)
Gave avs and calendar ° °

## 2018-03-22 NOTE — Patient Instructions (Addendum)
Doxorubicin injection What is this medicine? DOXORUBICIN (dox oh ROO bi sin) is a chemotherapy drug. It is used to treat many kinds of cancer like leukemia, lymphoma, neuroblastoma, sarcoma, and Wilms' tumor. It is also used to treat bladder cancer, breast cancer, lung cancer, ovarian cancer, stomach cancer, and thyroid cancer. This medicine may be used for other purposes; ask your health care provider or pharmacist if you have questions. COMMON BRAND NAME(S): Adriamycin, Adriamycin PFS, Adriamycin RDF, Rubex What should I tell my health care provider before I take this medicine? They need to know if you have any of these conditions: -heart disease -history of low blood counts caused by a medicine -liver disease -recent or ongoing radiation therapy -an unusual or allergic reaction to doxorubicin, other chemotherapy agents, other medicines, foods, dyes, or preservatives -pregnant or trying to get pregnant -breast-feeding How should I use this medicine? This drug is given as an infusion into a vein. It is administered in a hospital or clinic by a specially trained health care professional. If you have pain, swelling, burning or any unusual feeling around the site of your injection, tell your health care professional right away. Talk to your pediatrician regarding the use of this medicine in children. Special care may be needed. Overdosage: If you think you have taken too much of this medicine contact a poison control center or emergency room at once. NOTE: This medicine is only for you. Do not share this medicine with others. What if I miss a dose? It is important not to miss your dose. Call your doctor or health care professional if you are unable to keep an appointment. What may interact with this medicine? This medicine may interact with the following medications: -6-mercaptopurine -paclitaxel -phenytoin -St. John's Wort -trastuzumab -verapamil This list may not describe all possible  interactions. Give your health care provider a list of all the medicines, herbs, non-prescription drugs, or dietary supplements you use. Also tell them if you smoke, drink alcohol, or use illegal drugs. Some items may interact with your medicine. What should I watch for while using this medicine? This drug may make you feel generally unwell. This is not uncommon, as chemotherapy can affect healthy cells as well as cancer cells. Report any side effects. Continue your course of treatment even though you feel ill unless your doctor tells you to stop. There is a maximum amount of this medicine you should receive throughout your life. The amount depends on the medical condition being treated and your overall health. Your doctor will watch how much of this medicine you receive in your lifetime. Tell your doctor if you have taken this medicine before. You may need blood work done while you are taking this medicine. Your urine may turn red for a few days after your dose. This is not blood. If your urine is dark or brown, call your doctor. In some cases, you may be given additional medicines to help with side effects. Follow all directions for their use. Call your doctor or health care professional for advice if you get a fever, chills or sore throat, or other symptoms of a cold or flu. Do not treat yourself. This drug decreases your body's ability to fight infections. Try to avoid being around people who are sick. This medicine may increase your risk to bruise or bleed. Call your doctor or health care professional if you notice any unusual bleeding. Talk to your doctor about your risk of cancer. You may be more at risk for certain   types of cancers if you take this medicine. Do not become pregnant while taking this medicine or for 6 months after stopping it. Women should inform their doctor if they wish to become pregnant or think they might be pregnant. Men should not father a child while taking this medicine and  for 6 months after stopping it. There is a potential for serious side effects to an unborn child. Talk to your health care professional or pharmacist for more information. Do not breast-feed an infant while taking this medicine. This medicine has caused ovarian failure in some women and reduced sperm counts in some men This medicine may interfere with the ability to have a child. Talk with your doctor or health care professional if you are concerned about your fertility. What side effects may I notice from receiving this medicine? Side effects that you should report to your doctor or health care professional as soon as possible: -allergic reactions like skin rash, itching or hives, swelling of the face, lips, or tongue -breathing problems -chest pain -fast or irregular heartbeat -low blood counts - this medicine may decrease the number of white blood cells, red blood cells and platelets. You may be at increased risk for infections and bleeding. -pain, redness, or irritation at site where injected -signs of infection - fever or chills, cough, sore throat, pain or difficulty passing urine -signs of decreased platelets or bleeding - bruising, pinpoint red spots on the skin, black, tarry stools, blood in the urine -swelling of the ankles, feet, hands -tiredness -weakness Side effects that usually do not require medical attention (report to your doctor or health care professional if they continue or are bothersome): -diarrhea -hair loss -mouth sores -nail discoloration or damage -nausea -red colored urine -vomiting This list may not describe all possible side effects. Call your doctor for medical advice about side effects. You may report side effects to FDA at 1-800-FDA-1088. Where should I keep my medicine? This drug is given in a hospital or clinic and will not be stored at home. NOTE: This sheet is a summary. It may not cover all possible information. If you have questions about this  medicine, talk to your doctor, pharmacist, or health care provider.  2018 [employer]/Gold Standard (2015-11-19 11:28:51) Cyclophosphamide injection What is this medicine? CYCLOPHOSPHAMIDE (sye kloe FOSS fa mide) is a chemotherapy drug. It slows the growth of cancer cells. This medicine is used to treat many types of cancer like lymphoma, myeloma, leukemia, breast cancer, and ovarian cancer, to name a few. This medicine may be used for other purposes; ask your health care provider or pharmacist if you have questions. COMMON BRAND NAME(S): Cytoxan, Neosar What should I tell my health care provider before I take this medicine? They need to know if you have any of these conditions: -blood disorders -history of other chemotherapy -infection -kidney disease -liver disease -recent or ongoing radiation therapy -tumors in the bone marrow -an unusual or allergic reaction to cyclophosphamide, other chemotherapy, other medicines, foods, dyes, or preservatives -pregnant or trying to get pregnant -breast-feeding How should I use this medicine? This drug is usually given as an injection into a vein or muscle or by infusion into a vein. It is administered in a hospital or clinic by a specially trained health care professional. Talk to your pediatrician regarding the use of this medicine in children. Special care may be needed. Overdosage: If you think you have taken too much of this medicine contact a poison control center or emergency room at   once. NOTE: This medicine is only for you. Do not share this medicine with others. What if I miss a dose? It is important not to miss your dose. Call your doctor or health care professional if you are unable to keep an appointment. What may interact with this medicine? This medicine may interact with the following medications: -amiodarone -amphotericin B -azathioprine -certain antiviral medicines for HIV or AIDS such as protease inhibitors (e.g., indinavir,  ritonavir) and zidovudine -certain blood pressure medications such as benazepril, captopril, enalapril, fosinopril, lisinopril, moexipril, monopril, perindopril, quinapril, ramipril, trandolapril -certain cancer medications such as anthracyclines (e.g., daunorubicin, doxorubicin), busulfan, cytarabine, paclitaxel, pentostatin, tamoxifen, trastuzumab -certain diuretics such as chlorothiazide, chlorthalidone, hydrochlorothiazide, indapamide, metolazone -certain medicines that treat or prevent blood clots like warfarin -certain muscle relaxants such as succinylcholine -cyclosporine -etanercept -indomethacin -medicines to increase blood counts like filgrastim, pegfilgrastim, sargramostim -medicines used as general anesthesia -metronidazole -natalizumab This list may not describe all possible interactions. Give your health care provider a list of all the medicines, herbs, non-prescription drugs, or dietary supplements you use. Also tell them if you smoke, drink alcohol, or use illegal drugs. Some items may interact with your medicine. What should I watch for while using this medicine? Visit your doctor for checks on your progress. This drug may make you feel generally unwell. This is not uncommon, as chemotherapy can affect healthy cells as well as cancer cells. Report any side effects. Continue your course of treatment even though you feel ill unless your doctor tells you to stop. Drink water or other fluids as directed. Urinate often, even at night. In some cases, you may be given additional medicines to help with side effects. Follow all directions for their use. Call your doctor or health care professional for advice if you get a fever, chills or sore throat, or other symptoms of a cold or flu. Do not treat yourself. This drug decreases your body's ability to fight infections. Try to avoid being around people who are sick. This medicine may increase your risk to bruise or bleed. Call your doctor or  health care professional if you notice any unusual bleeding. Be careful brushing and flossing your teeth or using a toothpick because you may get an infection or bleed more easily. If you have any dental work done, tell your dentist you are receiving this medicine. You may get drowsy or dizzy. Do not drive, use machinery, or do anything that needs mental alertness until you know how this medicine affects you. Do not become pregnant while taking this medicine or for 1 year after stopping it. Women should inform their doctor if they wish to become pregnant or think they might be pregnant. Men should not father a child while taking this medicine and for 4 months after stopping it. There is a potential for serious side effects to an unborn child. Talk to your health care professional or pharmacist for more information. Do not breast-feed an infant while taking this medicine. This medicine may interfere with the ability to have a child. This medicine has caused ovarian failure in some women. This medicine has caused reduced sperm counts in some men. You should talk with your doctor or health care professional if you are concerned about your fertility. If you are going to have surgery, tell your doctor or health care professional that you have taken this medicine. What side effects may I notice from receiving this medicine? Side effects that you should report to your doctor or health care professional as   soon as possible: -allergic reactions like skin rash, itching or hives, swelling of the face, lips, or tongue -low blood counts - this medicine may decrease the number of white blood cells, red blood cells and platelets. You may be at increased risk for infections and bleeding. -signs of infection - fever or chills, cough, sore throat, pain or difficulty passing urine -signs of decreased platelets or bleeding - bruising, pinpoint red spots on the skin, black, tarry stools, blood in the urine -signs of  decreased red blood cells - unusually weak or tired, fainting spells, lightheadedness -breathing problems -dark urine -dizziness -palpitations -swelling of the ankles, feet, hands -trouble passing urine or change in the amount of urine -weight gain -yellowing of the eyes or skin Side effects that usually do not require medical attention (report to your doctor or health care professional if they continue or are bothersome): -changes in nail or skin color -hair loss -missed menstrual periods -mouth sores -nausea, vomiting This list may not describe all possible side effects. Call your doctor for medical advice about side effects. You may report side effects to FDA at 1-800-FDA-1088. Where should I keep my medicine? This drug is given in a hospital or clinic and will not be stored at home. NOTE: This sheet is a summary. It may not cover all possible information. If you have questions about this medicine, talk to your doctor, pharmacist, or health care provider.  2018 [employer]/Gold Standard (2012-08-06 16:22:58)     Pegfilgrastim injection What is this medicine? PEGFILGRASTIM (PEG fil gra stim) is a long-acting granulocyte colony-stimulating factor that stimulates the growth of neutrophils, a type of white blood cell important in the body's fight against infection. It is used to reduce the incidence of fever and infection in patients with certain types of cancer who are receiving chemotherapy that affects the bone marrow, and to increase survival after being exposed to high doses of radiation. This medicine may be used for other purposes; ask your health care provider or pharmacist if you have questions. COMMON BRAND NAME(S): Neulasta What should I tell my health care provider before I take this medicine? They need to know if you have any of these conditions: -kidney disease -latex allergy -ongoing radiation therapy -sickle cell disease -skin reactions to acrylic adhesives (On-Body  Injector only) -an unusual or allergic reaction to pegfilgrastim, filgrastim, other medicines, foods, dyes, or preservatives -pregnant or trying to get pregnant -breast-feeding How should I use this medicine? This medicine is for injection under the skin. If you get this medicine at home, you will be taught how to prepare and give the pre-filled syringe or how to use the On-body Injector. Refer to the patient Instructions for Use for detailed instructions. Use exactly as directed. Tell your healthcare provider immediately if you suspect that the On-body Injector may not have performed as intended or if you suspect the use of the On-body Injector resulted in a missed or partial dose. It is important that you put your used needles and syringes in a special sharps container. Do not put them in a trash can. If you do not have a sharps container, call your pharmacist or healthcare provider to get one. Talk to your pediatrician regarding the use of this medicine in children. While this drug may be prescribed for selected conditions, precautions do apply. Overdosage: If you think you have taken too much of this medicine contact a poison control center or emergency room at once. NOTE: This medicine is only for you. Do  not share this medicine with others. What if I miss a dose? It is important not to miss your dose. Call your doctor or health care professional if you miss your dose. If you miss a dose due to an On-body Injector failure or leakage, a new dose should be administered as soon as possible using a single prefilled syringe for manual use. What may interact with this medicine? Interactions have not been studied. Give your health care provider a list of all the medicines, herbs, non-prescription drugs, or dietary supplements you use. Also tell them if you smoke, drink alcohol, or use illegal drugs. Some items may interact with your medicine. This list may not describe all possible interactions. Give  your health care provider a list of all the medicines, herbs, non-prescription drugs, or dietary supplements you use. Also tell them if you smoke, drink alcohol, or use illegal drugs. Some items may interact with your medicine. What should I watch for while using this medicine? You may need blood work done while you are taking this medicine. If you are going to need a MRI, CT scan, or other procedure, tell your doctor that you are using this medicine (On-Body Injector only). What side effects may I notice from receiving this medicine? Side effects that you should report to your doctor or health care professional as soon as possible: -allergic reactions like skin rash, itching or hives, swelling of the face, lips, or tongue -dizziness -fever -pain, redness, or irritation at site where injected -pinpoint red spots on the skin -red or dark-brown urine -shortness of breath or breathing problems -stomach or side pain, or pain at the shoulder -swelling -tiredness -trouble passing urine or change in the amount of urine Side effects that usually do not require medical attention (report to your doctor or health care professional if they continue or are bothersome): -bone pain -muscle pain This list may not describe all possible side effects. Call your doctor for medical advice about side effects. You may report side effects to FDA at 1-800-FDA-1088. Where should I keep my medicine? Keep out of the reach of children. Store pre-filled syringes in a refrigerator between 2 and 8 degrees C (36 and 46 degrees F). Do not freeze. Keep in carton to protect from light. Throw away this medicine if it is left out of the refrigerator for more than 48 hours. Throw away any unused medicine after the expiration date. NOTE: This sheet is a summary. It may not cover all possible information. If you have questions about this medicine, talk to your doctor, pharmacist, or health care provider.  2018 [employer]/Gold  Standard (2016-09-18 12:58:03)

## 2018-03-22 NOTE — Progress Notes (Signed)
[place]  [phone]) [phone] [phone]     ID: Veronica Price DOB: 05/02/1946  MR#: [id number]  PPJ#:[id number]  Patient Care Team: [name], MD as PCP - General [name], [name], MD as Consulting Physician (Oncology) [name], [name], MD as Consulting Physician (Oncology) [name], MD as Consulting Physician (General Surgery) OTHER MD:  CHIEF COMPLAINT: Triple negative breast cancer  CURRENT TREATMENT: Neoadjuvant chemotherapy    INTERVAL HISTORY: Veronica Price returns today for follow up and treatment of her triple negative breast cancer, accompanied by her daughter.  She is currently undergoing neoadjuvant chemotherapy with weekly Gemcitabine/Carboplatin, to be followed by cyclophosphamide/doxorubicin in dose dense fashion x4. Today is day 1 cycle 10.    She tolerated treatment moderately well, however today, she is unfortunately neutropenic.     REVIEW OF SYSTEMS: Veronica Price did not do as well with the Gemcitabine/Carboplatin as she had hoped.  She noted lip and tongue swelling this past Friday and Saturday that resolved after taking one dose of benadryl.  The day after chemotherapy she noted her stomach was more uneasy.  This didn't require any nausea/vomiting medications.  She noted a decreased appetite x 2 days which has resolved and is back to normal.  Veronica Price has a mild cough today that is slowly improving.  She says it has been there for about 1 week. 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 Otherwise she denies fevers, chills, nausea, vomiting, constipation, diarrhea, headaches, or any other concerns.  A detailed ROS was otherwise non contributory.    HISTORY OF CURRENT ILLNESS: Veronica Price initially palpated a mass to her right breast in February 2019.   She brought her to her primary care physician's attention and was set up for bilateral diagnostic mammography with tomography and right  breast ultrasonography on 11/25/2017 showing: a 3.2 x 3.7 cm lobulated mass in the UOQ of the right breast; there was a 3 cm calcified retroareolar mass in the left breast. On right breast ultrasonography there was a complex mostly solid 2.4 x 3.1 cm mass at 10 o'clock and multiple enlarged right axillary lymph nodes, the largest measuring 1.6 x 2.4.  Accordingly on 11/27/2017 the patient proceeded to biopsy of the right breast area in question, as well as an attempted axillary node biopsy. The pathology from this procedure showed ([id number]):  high grade infiltrating ductal carcinoma with no lymphovascular invasion.  The attempted axillary node biopsy showed fat, no nodal tissue.  Prognostic indicators were: estrogen and progesterone receptor negative,. Proliferation marker Ki67 at >80%. HER2 negative by immunohistochemistry at 0%  CT scans of the chest abdomen and pelvis completed on 12/10/2017 showed A complex solid and cystic 3.5 cm right breast mass with several bilateral axillary lymph nodes, the largest in the right axilla measured 12 mm. There was no evidence of visceral metastatic disease.   She had a bone scan on 12/10/2017 that showed two areas of increased uptake overlying the lateral aspects of the T11 vertebrae. Review of the CT scan of the chest revealed sclerosis of the T11 pedicles concerning for bone metastases.  However subsequent spinal MRI (I do not have that report) read this out as arthritis, not a metastatic deposit  The patient had a echocardiogram completed on 3/72019 with results showing: left ventricular ejection fraction of 55-60% with mild concentric hypertrophy. There was mild to moderate mitral and mild tricuspid regurgitation with mild pulmonary hypertension. The aortic valve was mildly thickened  with trace aortic regurgitation.   The patient's subsequent history is as detailed below.  PAST MEDICAL HISTORY: Past Medical History:  Diagnosis Date  . Arthritis   . Breast  cancer ([place])    Right breast  . Cataract    "early" per patient  . GERD (gastroesophageal reflux disease)    occasionaly tums    PAST SURGICAL HISTORY: Past Surgical History:  Procedure Laterality Date  . ABDOMINAL HYSTERECTOMY     Total hysterectomy in 2002  . PORTACATH PLACEMENT N/A 12/29/2017   Procedure: INSERTION PORT-A-CATH LEFT SUBCLAVIAN;  Surgeon: [name], MD;  Location: [facility];  Service: General;  Laterality: N/A;    FAMILY HISTORY Family History  Problem Relation Age of Onset  . Diabetes Father   . Heart attack Father    She notes that her father died from a possible MI in his early 8's Her mother is still alive and will be 25 December 2017!  The patient has 2 brothers and 2 sisters. Her brother had prostate cancer possibly agent orange related. She has a cousin who was recently diagnosed with stomach cancer. Her maternal grandmother died from colon cancer. She has a maternal uncle with bone cancer. She denies family hx of breast or ovarian cancer.    GYNECOLOGIC HISTORY:  Menarche: 72 years old Age at first live birth: 72 years old [place] P3 LMP: at age 27 Contraceptive: OCP HRT   Hysterectomy? 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 She also worked at [employer]. She lives at home with her husband, Veronica Price who is a retired Building control surveyor, her husband works part time driving an adult day care bus. Her daughter, Veronica Price is a Multimedia programmer. Her daughter Veronica Price lives in [place], [place] and works as a Therapist, sports for the [place]. Her son Veronica Price, lives in [place], and works as a Administrator.  The patient has 3 grandchildren and no great-grandchildren. She is of baptist faith.    ADVANCED DIRECTIVES:    HEALTH MAINTENANCE: Social History   Tobacco Use  . Smoking status: Never Smoker  . Smokeless tobacco: Never Used  Substance Use Topics  . Alcohol use: Never     Frequency: Never  . Drug use: Never     Colonoscopy: UTD; last year (2018)  PAP:  Bone density:    Allergies  Allergen Reactions  . Prednisone Swelling and Other (See Comments)    " I think it was this that made my tongue swell "    Current Outpatient Medications  Medication Sig Dispense Refill  . HYDROcodone-acetaminophen (NORCO) 5-325 MG tablet Take 1-2 tablets by mouth every 6 (six) hours as needed for moderate pain or severe pain. 20 tablet 0  . lidocaine-prilocaine (EMLA) cream Apply to affected area once 30 g 3  . naproxen sodium (ALEVE) 220 MG tablet Take 220 mg by mouth daily as needed (for pain).    . ondansetron (ZOFRAN) 8 MG tablet Take 1 tablet (8 mg total) by mouth 2 (two) times daily as needed for refractory nausea / vomiting. Start on day 3 after chemo. 30 tablet 1  . prochlorperazine (COMPAZINE) 10 MG tablet Take 1 tablet (10 mg total) by mouth every 6 (six) hours as needed (Nausea or vomiting). 30 tablet 1   No current facility-administered medications for this visit.     OBJECTIVE: Vitals:   03/22/18 0946  BP: Marland Kitchen)  154/75  Pulse: 83  Resp: 17  Temp: 98.2 F (36.8 C)  SpO2: 100%     Body mass index is 31.61 kg/m.   Wt Readings from Last 3 Encounters:  03/22/18 172 lb 12.8 oz (78.4 kg)  03/15/18 174 lb 3.2 oz (79 kg)  03/04/18 178 lb 12 oz (81.1 kg)  ECOG FS:1 - Symptomatic but completely ambulatory GENERAL: Patient is a well appearing female in no acute distress HEENT:  Sclerae anicteric.  Oropharynx clear and moist. No ulcerations or evidence of oropharyngeal candidiasis. Neck is supple.  BREAST EXAM:  Deferred. LUNGS:  Clear to auscultation bilaterally.  No wheezes or rhonchi. HEART:  Regular rate and rhythm. No murmur appreciated. ABDOMEN:  Soft, nontender.  Positive, normoactive bowel sounds. No organomegaly palpated. MSK:  No focal spinal tenderness to palpation. Full range of motion bilaterally in the upper extremities. EXTREMITIES:  No peripheral  edema.   SKIN:  Clear with no obvious rashes or skin changes. No nail dyscrasia. NEURO:  Nonfocal. Well oriented.  Appropriate affect.    LAB RESULTS:  CMP     Component Value Date/Time   NA 140 03/15/2018 0810   K 3.7 03/15/2018 0810   CL 106 03/15/2018 0810   CO2 24 03/15/2018 0810   GLUCOSE 97 03/15/2018 0810   BUN 12 03/15/2018 0810   CREATININE 0.79 03/15/2018 0810   CREATININE 0.80 01/11/2018 0747   CALCIUM 9.1 03/15/2018 0810   PROT 7.3 03/15/2018 0810   ALBUMIN 3.8 03/15/2018 0810   AST 18 03/15/2018 0810   AST 17 01/11/2018 0747   ALT 13 03/15/2018 0810   ALT 16 01/11/2018 0747   ALKPHOS 63 03/15/2018 0810   BILITOT 0.4 03/15/2018 0810   BILITOT 0.6 01/11/2018 0747   GFRNONAA >60 03/15/2018 0810   GFRNONAA >60 01/11/2018 0747   GFRAA >60 03/15/2018 0810   GFRAA >60 01/11/2018 0747    No results found for: TOTALPROTELP, ALBUMINELP, A1GS, A2GS, BETS, BETA2SER, GAMS, MSPIKE, SPEI  No results found for: KPAFRELGTCHN, LAMBDASER, KAPLAMBRATIO  Lab Results  Component Value Date   WBC 1.8 (L) 03/22/2018   NEUTROABS 0.5 (L) 03/22/2018   HGB 10.7 (L) 03/22/2018   HCT 32.5 (L) 03/22/2018   MCV 100.0 03/22/2018   PLT 150 03/22/2018    @LASTCHEMISTRY @  No results found for: LABCA2  No components found for: KWIOXB353  No results for input(s): INR in the last 168 hours.  No results found for: LABCA2  No results found for: GDJ242  No results found for: AST419  No results found for: QQI297  No results found for: CA2729  No components found for: HGQUANT  No results found for: CEA1 / No results found for: CEA1   No results found for: AFPTUMOR  No results found for: CHROMOGRNA  No results found for: PSA1  Appointment on 03/22/2018  Component Date Value Ref Range Status  . WBC 03/22/2018 1.8* 3.9 - 10.3 K/uL Final  . RBC 03/22/2018 3.25* 3.70 - 5.45 MIL/uL Final  . Hemoglobin 03/22/2018 10.7* 11.6 - 15.9 g/dL Final  . HCT 03/22/2018 32.5* 34.8 -  46.6 % Final  . MCV 03/22/2018 100.0  79.5 - 101.0 fL Final  . MCH 03/22/2018 32.8  25.1 - 34.0 pg Final  . MCHC 03/22/2018 32.8  31.5 - 36.0 g/dL Final  . RDW 03/22/2018 17.5* 11.2 - 14.5 % Final  . Platelets 03/22/2018 150  145 - 400 K/uL Final  . Neutrophils Relative % 03/22/2018 29  % Final  .  Neutro Abs 03/22/2018 0.5* 1.5 - 6.5 K/uL Final  . Lymphocytes Relative 03/22/2018 57  % Final  . Lymphs Abs 03/22/2018 1.0  0.9 - 3.3 K/uL Final  . Monocytes Relative 03/22/2018 12  % Final  . Monocytes Absolute 03/22/2018 0.2  0.1 - 0.9 K/uL Final  . Eosinophils Relative 03/22/2018 1  % Final  . Eosinophils Absolute 03/22/2018 0.0  0.0 - 0.5 K/uL Final  . Basophils Relative 03/22/2018 1  % Final  . Basophils Absolute 03/22/2018 0.0  0.0 - 0.1 K/uL Final   Performed at [facility], [place] [address] [place], [place] [address]    (this displays the last labs from the last 3 days)  No results found for: TOTALPROTELP, ALBUMINELP, A1GS, A2GS, BETS, BETA2SER, GAMS, MSPIKE, SPEI (this displays SPEP labs)  No results found for: KPAFRELGTCHN, LAMBDASER, KAPLAMBRATIO (kappa/lambda light chains)  No results found for: HGBA, HGBA2QUANT, HGBFQUANT, HGBSQUAN (Hemoglobinopathy evaluation)   No results found for: LDH  No results found for: IRON, TIBC, IRONPCTSAT (Iron and TIBC)  No results found for: FERRITIN  Urinalysis No results found for: COLORURINE, APPEARANCEUR, LABSPEC, PHURINE, GLUCOSEU, HGBUR, BILIRUBINUR, KETONESUR, PROTEINUR, UROBILINOGEN, NITRITE, LEUKOCYTESUR   STUDIES: No results found.  ELIGIBLE FOR AVAILABLE RESEARCH PROTOCOL: Consider SWOG [id number] post-op  ASSESSMENT: 72 y.o. Veronica Price, [place] Price status post right breast upper outer quadrant biopsy 11/25/2017 for a clinical T2 N2, stage IIIc invasive ductal carcinoma, grade 3, triple negative, with an MIB-1 of 80%.  (1) staging studies:  (a) chest CT 12/10/2017 scan showed no visceral metastatic  disease  (b) bone scan 12/10/2017 showed uptake at T11, with sclerosis.  (c) CA-27-29 on 12/05/2017 was 19.0  (d) spinal MRI in [place] reportedly showed only arthrtis at T11  (2) neoadjuvant chemotherapy consisting of carboplatin/ paclitaxel x 12 starting 01/04/2018, changed to Gemcitabine and carboplatin after 9 cycles of Taxol/Carboplatin due to neuroapthy, only able to tolerate 1 cycle of Gemcitabine/Carboplatin to be followed by cytoxan/ adriamycin x 4 starting 04/05/18.  (3) definitive surgery to follow  (4) adjuvant radiation as appropriate   PLAN: Veronica Price is doing moderately well today.  Unfortunately she is neutropenic and cannot receive chemotherapy today.  I reviewed her treatment with Dr. Jana Hakim.  She will stop receiving the Gemcitabine and Carboplatin.  She will now transition to Doxorubicin and Cyclophosphamide.  I gave her a handout on each of these chemotherapies.  I reviewed Neulasta with her in detail.  She will receive that today for her neutropenia.  We reviewed neutropenic precautions in detail.  I also ordered an echocardiogram to evaluate Veronica Price's heart function prior to her receiving the Doxorubicin and Cyclophosphamide.    Veronica Price will return in 2 weeks for labs, f/u, and her first cycle of chemotherapy with Doxorubicin and cyclophosphamide.  They know to call for any other issues that may develop before her next visit.  A total of (30) minutes of face-to-face time was spent with this patient with greater than 50% of that time in counseling and care-coordination.   Veronica Bihari, NP  03/22/18 10:06 AM Medical Oncology and Hematology [facility] [address] [place], [place] [address] Tel. [phone]    Fax. [phone]

## 2018-03-23 ENCOUNTER — Other Ambulatory Visit: Payer: Medicare Other

## 2018-03-23 ENCOUNTER — Ambulatory Visit: Payer: Medicare Other | Admitting: Adult Health

## 2018-03-23 ENCOUNTER — Ambulatory Visit: Payer: Medicare Other

## 2018-03-26 ENCOUNTER — Telehealth: Payer: Self-pay

## 2018-03-26 NOTE — Telephone Encounter (Signed)
Error opening  

## 2018-03-29 ENCOUNTER — Other Ambulatory Visit: Payer: Medicare Other

## 2018-03-29 ENCOUNTER — Ambulatory Visit: Payer: Medicare Other

## 2018-03-29 ENCOUNTER — Ambulatory Visit: Payer: Medicare Other | Admitting: Adult Health

## 2018-03-29 ENCOUNTER — Ambulatory Visit: Payer: Medicare Other | Admitting: Oncology

## 2018-04-02 ENCOUNTER — Ambulatory Visit (HOSPITAL_COMMUNITY)
Admission: RE | Admit: 2018-04-02 | Discharge: 2018-04-02 | Disposition: A | Discharge status: Home / Self Care | Payer: Medicare Other | Source: Ambulatory Visit | Attending: Adult Health | Admitting: Adult Health

## 2018-04-02 DIAGNOSIS — I509 Heart failure, unspecified: Secondary | ICD-10-CM | POA: Diagnosis not present

## 2018-04-02 DIAGNOSIS — C50411 Malignant neoplasm of upper-outer quadrant of right female breast: Secondary | ICD-10-CM

## 2018-04-02 DIAGNOSIS — Z171 Estrogen receptor negative status [ER-]: Secondary | ICD-10-CM | POA: Insufficient documentation

## 2018-04-02 DIAGNOSIS — Z5111 Encounter for antineoplastic chemotherapy: Secondary | ICD-10-CM | POA: Insufficient documentation

## 2018-04-02 DIAGNOSIS — K219 Gastro-esophageal reflux disease without esophagitis: Secondary | ICD-10-CM | POA: Diagnosis not present

## 2018-04-02 NOTE — Progress Notes (Signed)
  Echocardiogram 2D Echocardiogram has been performed.  Veronica Price 04/02/2018, 12:31 PM

## 2018-04-05 ENCOUNTER — Telehealth: Payer: Self-pay | Admitting: Adult Health

## 2018-04-05 ENCOUNTER — Inpatient Hospital Stay: Payer: Medicare Other

## 2018-04-05 ENCOUNTER — Inpatient Hospital Stay (HOSPITAL_BASED_OUTPATIENT_CLINIC_OR_DEPARTMENT_OTHER): Payer: Medicare Other | Admitting: Adult Health

## 2018-04-05 ENCOUNTER — Ambulatory Visit: Payer: Medicare Other

## 2018-04-05 ENCOUNTER — Encounter: Payer: Self-pay | Admitting: Adult Health

## 2018-04-05 ENCOUNTER — Inpatient Hospital Stay: Payer: Medicare Other | Attending: Nurse Practitioner

## 2018-04-05 VITALS — BP 148/78 | HR 77 | Temp 97.8°F | Resp 17 | Ht 62.0 in | Wt 173.0 lb

## 2018-04-05 DIAGNOSIS — Z5189 Encounter for other specified aftercare: Secondary | ICD-10-CM | POA: Insufficient documentation

## 2018-04-05 DIAGNOSIS — D709 Neutropenia, unspecified: Secondary | ICD-10-CM | POA: Diagnosis not present

## 2018-04-05 DIAGNOSIS — Z5111 Encounter for antineoplastic chemotherapy: Secondary | ICD-10-CM | POA: Diagnosis not present

## 2018-04-05 DIAGNOSIS — C50411 Malignant neoplasm of upper-outer quadrant of right female breast: Secondary | ICD-10-CM

## 2018-04-05 DIAGNOSIS — R197 Diarrhea, unspecified: Secondary | ICD-10-CM | POA: Insufficient documentation

## 2018-04-05 DIAGNOSIS — Z95828 Presence of other vascular implants and grafts: Secondary | ICD-10-CM

## 2018-04-05 DIAGNOSIS — R51 Headache: Secondary | ICD-10-CM | POA: Diagnosis not present

## 2018-04-05 DIAGNOSIS — Z171 Estrogen receptor negative status [ER-]: Secondary | ICD-10-CM

## 2018-04-05 DIAGNOSIS — R5383 Other fatigue: Secondary | ICD-10-CM | POA: Insufficient documentation

## 2018-04-05 LAB — CBC WITH DIFFERENTIAL/PLATELET
BASOS PCT: 1 %
Basophils Absolute: 0.1 10*3/uL (ref 0.0–0.1)
Eosinophils Absolute: 0.1 10*3/uL (ref 0.0–0.5)
Eosinophils Relative: 2 %
HCT: 33.6 % — ABNORMAL LOW (ref 34.8–46.6)
Hemoglobin: 11.3 g/dL — ABNORMAL LOW (ref 11.6–15.9)
LYMPHS ABS: 0.9 10*3/uL (ref 0.9–3.3)
Lymphocytes Relative: 17 %
MCH: 34.1 pg — AB (ref 25.1–34.0)
MCHC: 33.7 g/dL (ref 31.5–36.0)
MCV: 101.2 fL — ABNORMAL HIGH (ref 79.5–101.0)
MONO ABS: 0.8 10*3/uL (ref 0.1–0.9)
MONOS PCT: 16 %
Neutro Abs: 3.3 10*3/uL (ref 1.5–6.5)
Neutrophils Relative %: 64 %
Platelets: 296 10*3/uL (ref 145–400)
RBC: 3.32 MIL/uL — ABNORMAL LOW (ref 3.70–5.45)
RDW: 19.2 % — AB (ref 11.2–14.5)
WBC: 5.1 10*3/uL (ref 3.9–10.3)

## 2018-04-05 LAB — COMPREHENSIVE METABOLIC PANEL
ALBUMIN: 4.1 g/dL (ref 3.5–5.0)
ALK PHOS: 99 U/L (ref 38–126)
ALT: 39 U/L (ref 0–44)
ANION GAP: 7 (ref 5–15)
AST: 30 U/L (ref 15–41)
BUN: 15 mg/dL (ref 8–23)
CALCIUM: 9.4 mg/dL (ref 8.9–10.3)
CO2: 28 mmol/L (ref 22–32)
Chloride: 105 mmol/L (ref 98–111)
Creatinine, Ser: 0.81 mg/dL (ref 0.44–1.00)
GLUCOSE: 91 mg/dL (ref 70–99)
POTASSIUM: 4.3 mmol/L (ref 3.5–5.1)
Sodium: 140 mmol/L (ref 135–145)
TOTAL PROTEIN: 7.3 g/dL (ref 6.5–8.1)
Total Bilirubin: 0.4 mg/dL (ref 0.3–1.2)

## 2018-04-05 MED ORDER — PEGFILGRASTIM 6 MG/0.6ML ~~LOC~~ PSKT
PREFILLED_SYRINGE | SUBCUTANEOUS | Status: AC
  Filled 2018-04-05: qty 0.6

## 2018-04-05 MED ORDER — SODIUM CHLORIDE 0.9 % IV SOLN
600.0000 mg/m2 | Freq: Once | INTRAVENOUS | Status: AC
  Administered 2018-04-05: 1120 mg via INTRAVENOUS
  Filled 2018-04-05: qty 56

## 2018-04-05 MED ORDER — PEGFILGRASTIM 6 MG/0.6ML ~~LOC~~ PSKT
6.0000 mg | PREFILLED_SYRINGE | Freq: Once | SUBCUTANEOUS | Status: AC
  Administered 2018-04-05: 6 mg via SUBCUTANEOUS

## 2018-04-05 MED ORDER — SODIUM CHLORIDE 0.9% FLUSH
10.0000 mL | Freq: Once | INTRAVENOUS | Status: AC
  Administered 2018-04-05: 10 mL
  Filled 2018-04-05: qty 10

## 2018-04-05 MED ORDER — PALONOSETRON HCL INJECTION 0.25 MG/5ML
0.2500 mg | Freq: Once | INTRAVENOUS | Status: AC
  Administered 2018-04-05: 0.25 mg via INTRAVENOUS

## 2018-04-05 MED ORDER — LORAZEPAM 0.5 MG PO TABS: 0.5000 mg | ORAL_TABLET | Freq: Every evening | ORAL | 0 refills | Status: DC | PRN

## 2018-04-05 MED ORDER — SODIUM CHLORIDE 0.9% FLUSH
10.0000 mL | INTRAVENOUS | Status: DC | PRN
  Administered 2018-04-05: 10 mL
  Filled 2018-04-05: qty 10

## 2018-04-05 MED ORDER — SODIUM CHLORIDE 0.9 % IV SOLN
Freq: Once | INTRAVENOUS | Status: AC
  Administered 2018-04-05: 10:00:00 via INTRAVENOUS

## 2018-04-05 MED ORDER — HEPARIN SOD (PORK) LOCK FLUSH 100 UNIT/ML IV SOLN
500.0000 [IU] | Freq: Once | INTRAVENOUS | Status: AC | PRN
  Administered 2018-04-05: 500 [IU]
  Filled 2018-04-05: qty 5

## 2018-04-05 MED ORDER — SODIUM CHLORIDE 0.9 % IV SOLN
Freq: Once | INTRAVENOUS | Status: AC
  Administered 2018-04-05: 11:00:00 via INTRAVENOUS
  Filled 2018-04-05: qty 5

## 2018-04-05 MED ORDER — PALONOSETRON HCL INJECTION 0.25 MG/5ML
INTRAVENOUS | Status: AC
  Filled 2018-04-05: qty 5

## 2018-04-05 MED ORDER — DOXORUBICIN HCL CHEMO IV INJECTION 2 MG/ML
60.0000 mg/m2 | Freq: Once | INTRAVENOUS | Status: AC
  Administered 2018-04-05: 112 mg via INTRAVENOUS
  Filled 2018-04-05: qty 56

## 2018-04-05 NOTE — Telephone Encounter (Signed)
Appointments scheduled per 7/1 los. Took schedule to patient while in infusion - avs report and calendar for July/August.

## 2018-04-05 NOTE — Patient Instructions (Signed)
[facility] Discharge Instructions for Patients Receiving Chemotherapy  Today you received the following chemotherapy agents Doxorubicin, Cyclophosphomide  To help prevent nausea and vomiting after your treatment, we encourage you to take your nausea medication as directed by MD   If you develop nausea and vomiting that is not controlled by your nausea medication, call the clinic.   BELOW ARE SYMPTOMS THAT SHOULD BE REPORTED IMMEDIATELY:  *FEVER GREATER THAN 100.5 F  *CHILLS WITH OR WITHOUT FEVER  NAUSEA AND VOMITING THAT IS NOT CONTROLLED WITH YOUR NAUSEA MEDICATION  *UNUSUAL SHORTNESS OF BREATH  *UNUSUAL BRUISING OR BLEEDING  TENDERNESS IN MOUTH AND THROAT WITH OR WITHOUT PRESENCE OF ULCERS  *URINARY PROBLEMS  *BOWEL PROBLEMS  UNUSUAL RASH Items with * indicate a potential emergency and should be followed up as soon as possible.  Feel free to call the clinic should you have any questions or concerns. The clinic phone number is [phone].  Please show the [facility] at check-in to the Emergency Department and triage nurse.  Doxorubicin injection What is this medicine? DOXORUBICIN (dox oh ROO bi sin) is a chemotherapy drug. It is used to treat many kinds of cancer like leukemia, lymphoma, neuroblastoma, sarcoma, and Wilms' tumor. It is also used to treat bladder cancer, breast cancer, lung cancer, ovarian cancer, stomach cancer, and thyroid cancer. This medicine may be used for other purposes; ask your health care provider or pharmacist if you have questions. COMMON BRAND NAME(S): Adriamycin, Adriamycin PFS, Adriamycin RDF, Rubex What should I tell my health care provider before I take this medicine? They need to know if you have any of these conditions: -heart disease -history of low blood counts caused by a medicine -liver disease -recent or ongoing radiation therapy -an unusual or allergic reaction to doxorubicin, other chemotherapy agents,  other medicines, foods, dyes, or preservatives -pregnant or trying to get pregnant -breast-feeding How should I use this medicine? This drug is given as an infusion into a vein. It is administered in a hospital or clinic by a specially trained health care professional. If you have pain, swelling, burning or any unusual feeling around the site of your injection, tell your health care professional right away. Talk to your pediatrician regarding the use of this medicine in children. Special care may be needed. Overdosage: If you think you have taken too much of this medicine contact a poison control center or emergency room at once. NOTE: This medicine is only for you. Do not share this medicine with others. What if I miss a dose? It is important not to miss your dose. Call your doctor or health care professional if you are unable to keep an appointment. What may interact with this medicine? This medicine may interact with the following medications: -6-mercaptopurine -paclitaxel -phenytoin -St. John's Wort -trastuzumab -verapamil This list may not describe all possible interactions. Give your health care provider a list of all the medicines, herbs, non-prescription drugs, or dietary supplements you use. Also tell them if you smoke, drink alcohol, or use illegal drugs. Some items may interact with your medicine. What should I watch for while using this medicine? This drug may make you feel generally unwell. This is not uncommon, as chemotherapy can affect healthy cells as well as cancer cells. Report any side effects. Continue your course of treatment even though you feel ill unless your doctor tells you to stop. There is a maximum amount of this medicine you should receive throughout your life. The amount depends  on the medical condition being treated and your overall health. Your doctor will watch how much of this medicine you receive in your lifetime. Tell your doctor if you have taken this  medicine before. You may need blood work done while you are taking this medicine. Your urine may turn red for a few days after your dose. This is not blood. If your urine is dark or brown, call your doctor. In some cases, you may be given additional medicines to help with side effects. Follow all directions for their use. Call your doctor or health care professional for advice if you get a fever, chills or sore throat, or other symptoms of a cold or flu. Do not treat yourself. This drug decreases your body's ability to fight infections. Try to avoid being around people who are sick. This medicine may increase your risk to bruise or bleed. Call your doctor or health care professional if you notice any unusual bleeding. Talk to your doctor about your risk of cancer. You may be more at risk for certain types of cancers if you take this medicine. Do not become pregnant while taking this medicine or for 6 months after stopping it. Women should inform their doctor if they wish to become pregnant or think they might be pregnant. Men should not father a child while taking this medicine and for 6 months after stopping it. There is a potential for serious side effects to an unborn child. Talk to your health care professional or pharmacist for more information. Do not breast-feed an infant while taking this medicine. This medicine has caused ovarian failure in some women and reduced sperm counts in some men This medicine may interfere with the ability to have a child. Talk with your doctor or health care professional if you are concerned about your fertility. What side effects may I notice from receiving this medicine? Side effects that you should report to your doctor or health care professional as soon as possible: -allergic reactions like skin rash, itching or hives, swelling of the face, lips, or tongue -breathing problems -chest pain -fast or irregular heartbeat -low blood counts - this medicine may  decrease the number of white blood cells, red blood cells and platelets. You may be at increased risk for infections and bleeding. -pain, redness, or irritation at site where injected -signs of infection - fever or chills, cough, sore throat, pain or difficulty passing urine -signs of decreased platelets or bleeding - bruising, pinpoint red spots on the skin, black, tarry stools, blood in the urine -swelling of the ankles, feet, hands -tiredness -weakness Side effects that usually do not require medical attention (report to your doctor or health care professional if they continue or are bothersome): -diarrhea -hair loss -mouth sores -nail discoloration or damage -nausea -red colored urine -vomiting This list may not describe all possible side effects. Call your doctor for medical advice about side effects. You may report side effects to FDA at 1-800-FDA-1088. Where should I keep my medicine? This drug is given in a hospital or clinic and will not be stored at home. NOTE: This sheet is a summary. It may not cover all possible information. If you have questions about this medicine, talk to your doctor, pharmacist, or health care provider.  2018 [employer]/Gold Standard (2015-11-19 11:28:51)   Cyclophosphamide injection What is this medicine? CYCLOPHOSPHAMIDE (sye kloe FOSS fa mide) is a chemotherapy drug. It slows the growth of cancer cells. This medicine is used to treat many types of cancer  like lymphoma, myeloma, leukemia, breast cancer, and ovarian cancer, to name a few. This medicine may be used for other purposes; ask your health care provider or pharmacist if you have questions. COMMON BRAND NAME(S): Cytoxan, Neosar What should I tell my health care provider before I take this medicine? They need to know if you have any of these conditions: -blood disorders -history of other chemotherapy -infection -kidney disease -liver disease -recent or ongoing radiation therapy -tumors in  the bone marrow -an unusual or allergic reaction to cyclophosphamide, other chemotherapy, other medicines, foods, dyes, or preservatives -pregnant or trying to get pregnant -breast-feeding How should I use this medicine? This drug is usually given as an injection into a vein or muscle or by infusion into a vein. It is administered in a hospital or clinic by a specially trained health care professional. Talk to your pediatrician regarding the use of this medicine in children. Special care may be needed. Overdosage: If you think you have taken too much of this medicine contact a poison control center or emergency room at once. NOTE: This medicine is only for you. Do not share this medicine with others. What if I miss a dose? It is important not to miss your dose. Call your doctor or health care professional if you are unable to keep an appointment. What may interact with this medicine? This medicine may interact with the following medications: -amiodarone -amphotericin B -azathioprine -certain antiviral medicines for HIV or AIDS such as protease inhibitors (e.g., indinavir, ritonavir) and zidovudine -certain blood pressure medications such as benazepril, captopril, enalapril, fosinopril, lisinopril, moexipril, monopril, perindopril, quinapril, ramipril, trandolapril -certain cancer medications such as anthracyclines (e.g., daunorubicin, doxorubicin), busulfan, cytarabine, paclitaxel, pentostatin, tamoxifen, trastuzumab -certain diuretics such as chlorothiazide, chlorthalidone, hydrochlorothiazide, indapamide, metolazone -certain medicines that treat or prevent blood clots like warfarin -certain muscle relaxants such as succinylcholine -cyclosporine -etanercept -indomethacin -medicines to increase blood counts like filgrastim, pegfilgrastim, sargramostim -medicines used as general anesthesia -metronidazole -natalizumab This list may not describe all possible interactions. Give your health  care provider a list of all the medicines, herbs, non-prescription drugs, or dietary supplements you use. Also tell them if you smoke, drink alcohol, or use illegal drugs. Some items may interact with your medicine. What should I watch for while using this medicine? Visit your doctor for checks on your progress. This drug may make you feel generally unwell. This is not uncommon, as chemotherapy can affect healthy cells as well as cancer cells. Report any side effects. Continue your course of treatment even though you feel ill unless your doctor tells you to stop. Drink water or other fluids as directed. Urinate often, even at night. In some cases, you may be given additional medicines to help with side effects. Follow all directions for their use. Call your doctor or health care professional for advice if you get a fever, chills or sore throat, or other symptoms of a cold or flu. Do not treat yourself. This drug decreases your body's ability to fight infections. Try to avoid being around people who are sick. This medicine may increase your risk to bruise or bleed. Call your doctor or health care professional if you notice any unusual bleeding. Be careful brushing and flossing your teeth or using a toothpick because you may get an infection or bleed more easily. If you have any dental work done, tell your dentist you are receiving this medicine. You may get drowsy or dizzy. Do not drive, use machinery, or do anything that needs  mental alertness until you know how this medicine affects you. Do not become pregnant while taking this medicine or for 1 year after stopping it. Women should inform their doctor if they wish to become pregnant or think they might be pregnant. Men should not father a child while taking this medicine and for 4 months after stopping it. There is a potential for serious side effects to an unborn child. Talk to your health care professional or pharmacist for more information. Do not  breast-feed an infant while taking this medicine. This medicine may interfere with the ability to have a child. This medicine has caused ovarian failure in some women. This medicine has caused reduced sperm counts in some men. You should talk with your doctor or health care professional if you are concerned about your fertility. If you are going to have surgery, tell your doctor or health care professional that you have taken this medicine. What side effects may I notice from receiving this medicine? Side effects that you should report to your doctor or health care professional as soon as possible: -allergic reactions like skin rash, itching or hives, swelling of the face, lips, or tongue -low blood counts - this medicine may decrease the number of white blood cells, red blood cells and platelets. You may be at increased risk for infections and bleeding. -signs of infection - fever or chills, cough, sore throat, pain or difficulty passing urine -signs of decreased platelets or bleeding - bruising, pinpoint red spots on the skin, black, tarry stools, blood in the urine -signs of decreased red blood cells - unusually weak or tired, fainting spells, lightheadedness -breathing problems -dark urine -dizziness -palpitations -swelling of the ankles, feet, hands -trouble passing urine or change in the amount of urine -weight gain -yellowing of the eyes or skin Side effects that usually do not require medical attention (report to your doctor or health care professional if they continue or are bothersome): -changes in nail or skin color -hair loss -missed menstrual periods -mouth sores -nausea, vomiting This list may not describe all possible side effects. Call your doctor for medical advice about side effects. You may report side effects to FDA at 1-800-FDA-1088. Where should I keep my medicine? This drug is given in a hospital or clinic and will not be stored at home. NOTE: This sheet is a  summary. It may not cover all possible information. If you have questions about this medicine, talk to your doctor, pharmacist, or health care provider.  2018 [employer]/Gold Standard (2012-08-06 16:22:58)

## 2018-04-05 NOTE — Progress Notes (Addendum)
[facility]  [phone] [phone]     ID: Veronica Price DOB: [birth year]  MR#: [id number]  JIR#:[id number]  Patient Care Team: [name], MD as PCP - General [name], [name], MD as Consulting Physician (Oncology) [name], [name], MD as Consulting Physician (Oncology) [name], MD as Consulting Physician (General Surgery) OTHER MD:  CHIEF COMPLAINT: Triple negative breast cancer  CURRENT TREATMENT: Neoadjuvant chemotherapy    INTERVAL HISTORY: Veronica Price returns today for follow up and treatment of her triple negative breast cancer, accompanied by her husband.  Veronica Price is doing well today.  She recently completed 9 cycles of weekly neoadjuvant Paclitaxel and Carboplatin, that was stopped early due to peripheral neuropathy, and then one cycle of Gemcitabine and Carboplatin, that was stopped due to rash.  She is here today to begin the first of four cycles of neoadjuvant Doxorubicin and Cyclophsophamide with neulasta support.     REVIEW OF SYSTEMS: Veronica Price is feeling well today. 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  The neuropathy in her toes was absent, but did return though very mild.  She has no motor deficits such as balance loss due to this.    Veronica Price is eating well.  She has no pain or shortness of breath, no nausea and vomiting, and no constipation or diarrhea.  She denies headaches, vision changes, mucositis, dysphagia.  She is active and taking care of herself.    Veronica Price did undergo an echocardiogram on 04/02/2018 that demonstrated a LVEF of 60-65%.    A detailed ROS that was conducted was otherwise non contributory.     HISTORY OF CURRENT ILLNESS: Veronica Price initially palpated a mass to her right breast in February 2019.   She brought her to her primary care physician's attention and was set up for bilateral diagnostic mammography with tomography and right breast ultrasonography on 11/25/2017  showing: a 3.2 x 3.7 cm lobulated mass in the UOQ of the right breast; there was a 3 cm calcified retroareolar mass in the left breast. On right breast ultrasonography there was a complex mostly solid 2.4 x 3.1 cm mass at 10 o'clock and multiple enlarged right axillary lymph nodes, the largest measuring 1.6 x 2.4.  Accordingly on 11/27/2017 the patient proceeded to biopsy of the right breast area in question, as well as an attempted axillary node biopsy. The pathology from this procedure showed ([id number]):  high grade infiltrating ductal carcinoma with no lymphovascular invasion.  The attempted axillary node biopsy showed fat, no nodal tissue.  Prognostic indicators were: estrogen and progesterone receptor negative,. Proliferation marker Ki67 at >80%. HER2 negative by immunohistochemistry at 0%  CT scans of the chest abdomen and pelvis completed on 12/10/2017 showed A complex solid and cystic 3.5 cm right breast mass with several bilateral axillary lymph nodes, the largest in the right axilla measured 12 mm. There was no evidence of visceral metastatic disease.   She had a bone scan on 12/10/2017 that showed two areas of increased uptake overlying the lateral aspects of the T11 vertebrae. Review of the CT scan of the chest revealed sclerosis of the T11 pedicles concerning for bone metastases.  However subsequent spinal MRI (I do not have that report) read this out as arthritis, not a metastatic deposit  The patient had a echocardiogram completed on 3/72019 with results showing: left ventricular ejection fraction of 55-60% with mild concentric hypertrophy. There was mild to moderate mitral and mild tricuspid regurgitation with mild pulmonary  hypertension. The aortic valve was mildly thickened with trace aortic regurgitation.   The patient's subsequent history is as detailed below.  PAST MEDICAL HISTORY: Past Medical History:  Diagnosis Date  . Arthritis   . Breast cancer ([place])    Right breast  .  Cataract    "early" per patient  . GERD (gastroesophageal reflux disease)    occasionaly tums    PAST SURGICAL HISTORY: Past Surgical History:  Procedure Laterality Date  . ABDOMINAL HYSTERECTOMY     Total hysterectomy in 2002  . PORTACATH PLACEMENT N/A 12/29/2017   Procedure: INSERTION PORT-A-CATH LEFT SUBCLAVIAN;  Surgeon: [name], MD;  Location: [facility];  Service: General;  Laterality: N/A;    FAMILY HISTORY Family History  Problem Relation Age of Onset  . Diabetes Father   . Heart attack Father    She notes that her father died from a possible MI in his early 62's Her mother is still alive and will be 31 December 2017!  The patient has 2 brothers and 2 sisters. Her brother had prostate cancer possibly agent orange related. She has a cousin who was recently diagnosed with stomach cancer. Her maternal grandmother died from colon cancer. She has a maternal uncle with bone cancer. She denies family hx of breast or ovarian cancer.    GYNECOLOGIC HISTORY:  Menarche: 72 years old Age at first live birth: 72 years old [place] P3 LMP: at age 73 Contraceptive: OCP HRT   Hysterectomy? 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 She also worked at [employer]. She lives at home with her husband, Gwyndolyn Saxon who is a retired Building control surveyor, her husband works part time driving an adult day care bus. Her daughter, [name] is a Multimedia programmer. Her daughter [name] lives in [place], [place] and works as a Therapist, sports for the [place]. Her son Juda Lajeunesse, lives in [place], and works as a Administrator.  The patient has 3 grandchildren and no great-grandchildren. She is of baptist faith.    ADVANCED DIRECTIVES:    HEALTH MAINTENANCE: Social History   Tobacco Use  . Smoking status: Never Smoker  . Smokeless tobacco: Never Used  Substance Use Topics  . Alcohol use: Never    Frequency: Never  . Drug use:  Never     Colonoscopy: UTD; last year (2018)  PAP:  Bone density:    Allergies  Allergen Reactions  . Prednisone Swelling and Other (See Comments)    " I think it was this that made my tongue swell "    Current Outpatient Medications  Medication Sig Dispense Refill  . HYDROcodone-acetaminophen (NORCO) 5-325 MG tablet Take 1-2 tablets by mouth every 6 (six) hours as needed for moderate pain or severe pain. 20 tablet 0  . naproxen sodium (ALEVE) 220 MG tablet Take 220 mg by mouth daily as needed (for pain).    . prochlorperazine (COMPAZINE) 10 MG tablet Take 10 mg by mouth every 6 (six) hours as needed for nausea or vomiting.     No current facility-administered medications for this visit.     OBJECTIVE: Vitals:   04/05/18 0810  BP: (!) 148/78  Pulse: 77  Resp: 17  Temp: 97.8 F (36.6 C)  SpO2: 98%     Body mass index is 31.64 kg/m.   Wt Readings from Last 3 Encounters:  04/05/18 173 lb (78.5 kg)  03/22/18 172 lb  12.8 oz (78.4 kg)  03/15/18 174 lb 3.2 oz (79 kg)  ECOG FS:1 - Symptomatic but completely ambulatory GENERAL: Patient is a well appearing female in no acute distress HEENT:  Sclerae anicteric.  Oropharynx clear and moist. No ulcerations or evidence of oropharyngeal candidiasis. Neck is supple.  BREAST EXAM:  Deferred. LUNGS:  Clear to auscultation bilaterally.  No wheezes or rhonchi. HEART:  Regular rate and rhythm. No murmur appreciated. ABDOMEN:  Soft, nontender.  Positive, normoactive bowel sounds. No organomegaly palpated. MSK:  No focal spinal tenderness to palpation. Full range of motion bilaterally in the upper extremities. EXTREMITIES:  No peripheral edema.   SKIN:  Clear with no obvious rashes or skin changes. No nail dyscrasia. NEURO:  Nonfocal. Well oriented.  Appropriate affect.    LAB RESULTS:  CMP     Component Value Date/Time   NA 141 03/22/2018 0843   K 3.8 03/22/2018 0843   CL 106 03/22/2018 0843   CO2 28 03/22/2018 0843   GLUCOSE  125 03/22/2018 0843   BUN 13 03/22/2018 0843   CREATININE 0.84 03/22/2018 0843   CREATININE 0.80 01/11/2018 0747   CALCIUM 9.3 03/22/2018 0843   PROT 7.2 03/22/2018 0843   ALBUMIN 3.8 03/22/2018 0843   AST 21 03/22/2018 0843   AST 17 01/11/2018 0747   ALT 30 03/22/2018 0843   ALT 16 01/11/2018 0747   ALKPHOS 67 03/22/2018 0843   BILITOT 0.4 03/22/2018 0843   BILITOT 0.6 01/11/2018 0747   GFRNONAA >60 03/22/2018 0843   GFRNONAA >60 01/11/2018 0747   GFRAA >60 03/22/2018 0843   GFRAA >60 01/11/2018 0747    No results found for: TOTALPROTELP, ALBUMINELP, A1GS, A2GS, BETS, BETA2SER, GAMS, MSPIKE, SPEI  No results found for: KPAFRELGTCHN, LAMBDASER, KAPLAMBRATIO  Lab Results  Component Value Date   WBC 5.1 04/05/2018   NEUTROABS 3.3 04/05/2018   HGB 11.3 (L) 04/05/2018   HCT 33.6 (L) 04/05/2018   MCV 101.2 (H) 04/05/2018   PLT 296 04/05/2018    _0 @  No results found for: LABCA2  No components found for: [id number]  No results for input(s): INR in the last 168 hours.  No results found for: LABCA2  No results found for: VXB939  No results found for: QZE092  No results found for: ZRA076  No results found for: CA2729  No components found for: HGQUANT  No results found for: CEA1 / No results found for: CEA1   No results found for: AFPTUMOR  No results found for: CHROMOGRNA  No results found for: PSA1  Appointment on 04/05/2018  Component Date Value Ref Range Status  . WBC 04/05/2018 5.1  3.9 - 10.3 K/uL Final  . RBC 04/05/2018 3.32* 3.70 - 5.45 MIL/uL Final  . Hemoglobin 04/05/2018 11.3* 11.6 - 15.9 g/dL Final  . HCT 04/05/2018 33.6* 34.8 - 46.6 % Final  . MCV 04/05/2018 101.2* 79.5 - 101.0 fL Final  . MCH 04/05/2018 34.1* 25.1 - 34.0 pg Final  . MCHC 04/05/2018 33.7  31.5 - 36.0 g/dL Final  . RDW 04/05/2018 19.2* 11.2 - 14.5 % Final  . Platelets 04/05/2018 296  145 - 400 K/uL Final  . Neutrophils Relative % 04/05/2018 64  % Final  . Neutro  Abs 04/05/2018 3.3  1.5 - 6.5 K/uL Final  . Lymphocytes Relative 04/05/2018 17  % Final  . Lymphs Abs 04/05/2018 0.9  0.9 - 3.3 K/uL Final  . Monocytes Relative 04/05/2018 16  % Final  . Monocytes Absolute 04/05/2018 0.8  0.1 - 0.9 K/uL Final  . Eosinophils Relative 04/05/2018 2  % Final  . Eosinophils Absolute 04/05/2018 0.1  0.0 - 0.5 K/uL Final  . Basophils Relative 04/05/2018 1  % Final  . Basophils Absolute 04/05/2018 0.1  0.0 - 0.1 K/uL Final   Performed at [facility], [place] [address], [place], [place] [address]    (this displays the last labs from the last 3 days)  No results found for: TOTALPROTELP, ALBUMINELP, A1GS, A2GS, BETS, BETA2SER, GAMS, MSPIKE, SPEI (this displays SPEP labs)  No results found for: KPAFRELGTCHN, LAMBDASER, KAPLAMBRATIO (kappa/lambda light chains)  No results found for: HGBA, HGBA2QUANT, HGBFQUANT, HGBSQUAN (Hemoglobinopathy evaluation)   No results found for: LDH  No results found for: IRON, TIBC, IRONPCTSAT (Iron and TIBC)  No results found for: FERRITIN  Urinalysis No results found for: COLORURINE, APPEARANCEUR, LABSPEC, PHURINE, GLUCOSEU, HGBUR, BILIRUBINUR, KETONESUR, PROTEINUR, UROBILINOGEN, NITRITE, LEUKOCYTESUR   STUDIES: No results found.  ELIGIBLE FOR AVAILABLE RESEARCH PROTOCOL: Consider SWOG [phone] post-op  ASSESSMENT: 72 y.o. [name], [place] woman status post right breast upper outer quadrant biopsy 11/25/2017 for a clinical T2 N2, stage IIIc invasive ductal carcinoma, grade 3, triple negative, with an MIB-1 of 80%.  (1) staging studies:  (a) chest CT 12/10/2017 scan showed no visceral metastatic disease  (b) bone scan 12/10/2017 showed uptake at T11, with sclerosis.  (c) CA-27-29 on 12/05/2017 was 19.0  (d) spinal MRI in [place] reportedly showed only arthrtis at T11  (2) neoadjuvant chemotherapy consisting of carboplatin/ paclitaxel x 12 starting 01/04/2018, discontinued 03/15/2018, followed by  cyclophosphamide and doxorubicin given every 3 weeks x 4 starting 04/05/2018   (a) carboplatin/paclitaxel changed to carboplatin/gemcitabine after 9 cycles of Taxol/Carboplatin due to neuroapthy  (b) only able to tolerate 1 cycle of Gemcitabine/Carboplatin due to rash  (a) echocardiogram on 04/02/18 demonstrates a LVEF of 60-65%  (3) definitive surgery to follow  (4) adjuvant radiation as appropriate   PLAN:  ADDENDUM:  Veronica Price has recovered from the carboplatin/taxane/gemcitabine treatments and now is ready to start cyclophosphamide/doxorubicin.  Given her age and overall functional status as well as the neutropenia she developed after a dose of carboplatin/Gemzar, I think it would be prudent to treat her every 3 weeks instead of every 2 weeks.  We reviewed her echocardiogram which is very favorable, and also how to take her supportive medications.  She was given a "roadmap" to follow and all the orders were placed both for treatment in the clinic and for outpatient medications  We will see her again in approximately 8 to 10 days to assess  nadir counts and overall tolerance  We will continue to follow the palpable disease in the breast which is showing improvement but not yet resolution  I urged the patient to call us with any questions or concerns.  [name] [name] MD   04/05/18 8:46 AM Medical Oncology and Hematology [facility] [place], [place] [address] Tel. [phone]    Fax. [phone]     I personally saw this patient and performed a substantive portion of this encounter with the listed APP documented above.   Chauncey Cruel, MD Medical Oncology and Hematology [facility] [address] [place], [place] [address] Tel. [phone]    Fax. [phone]

## 2018-04-05 NOTE — Progress Notes (Signed)
DISCONTINUE ON PATHWAY REGIMEN - Breast     A cycle is every 21 days (cycles 1-4):     Paclitaxel      Carboplatin    A cycle is every 14 days (cycles 5-8):     Doxorubicin      Cyclophosphamide      Pegfilgrastim-xxxx   **Always confirm dose/schedule in your pharmacy ordering system**  REASON: Continuation Of Treatment PRIOR TREATMENT: [id number]:  Paclitaxel 80 mg/m2 Weekly + Carboplatin AUC=6 q21 Days x 12 Weeks, Followed by Dose-Dense AC [Doxorubicin + Cyclophosphamide q14 Days x 4 Cycles] TREATMENT RESPONSE: Unable to Evaluate  START OFF PATHWAY REGIMEN - Breast   [id number]:Dose-Dense AC C1-4 Followed by Paclitaxel + Pertuzumab + Trastuzumab C5-8 Followed by Pertuzumab + Trastuzumab C9-21:   A cycle is every 14 days:     Doxorubicin      Cyclophosphamide    A cycle is every 21 days:     Trastuzumab      Trastuzumab      Pertuzumab      Pertuzumab      Paclitaxel   **Always confirm dose/schedule in your pharmacy ordering system**  Patient Characteristics: Preoperative or Nonsurgical Candidate (Clinical Staging), Neoadjuvant Therapy followed by Surgery, Invasive Disease, Chemotherapy, HER2 Negative/Unknown/Equivocal, ER Negative/Unknown, Platinum Therapy Indicated Therapeutic Status: Preoperative or Nonsurgical Candidate (Clinical Staging) AJCC M Category: cM0 AJCC Grade: G3 Breast Surgical Plan: Neoadjuvant Therapy followed by Surgery ER Status: Negative (-) AJCC 8 Stage Grouping: IIIC HER2 Status: Negative (-) AJCC T Category: cT2 AJCC N Category: cN2 PR Status: Negative (-) Type of Therapy: Platinum Therapy Indicated Intent of Therapy: Curative Intent, Discussed with Patient

## 2018-04-07 ENCOUNTER — Other Ambulatory Visit (HOSPITAL_COMMUNITY): Payer: Medicare Other

## 2018-04-12 ENCOUNTER — Telehealth: Payer: Self-pay | Admitting: Adult Health

## 2018-04-12 ENCOUNTER — Inpatient Hospital Stay: Payer: Medicare Other | Admitting: *Deleted

## 2018-04-12 ENCOUNTER — Inpatient Hospital Stay: Payer: Medicare Other

## 2018-04-12 ENCOUNTER — Encounter: Payer: Self-pay | Admitting: Adult Health

## 2018-04-12 ENCOUNTER — Inpatient Hospital Stay (HOSPITAL_BASED_OUTPATIENT_CLINIC_OR_DEPARTMENT_OTHER): Payer: Medicare Other | Admitting: Adult Health

## 2018-04-12 VITALS — BP 142/79 | HR 76 | Temp 98.0°F | Resp 18 | Ht 62.0 in | Wt 171.5 lb

## 2018-04-12 DIAGNOSIS — R51 Headache: Secondary | ICD-10-CM

## 2018-04-12 DIAGNOSIS — Z5111 Encounter for antineoplastic chemotherapy: Secondary | ICD-10-CM | POA: Diagnosis not present

## 2018-04-12 DIAGNOSIS — C50411 Malignant neoplasm of upper-outer quadrant of right female breast: Secondary | ICD-10-CM

## 2018-04-12 DIAGNOSIS — R5383 Other fatigue: Secondary | ICD-10-CM

## 2018-04-12 DIAGNOSIS — Z171 Estrogen receptor negative status [ER-]: Secondary | ICD-10-CM | POA: Diagnosis not present

## 2018-04-12 DIAGNOSIS — R197 Diarrhea, unspecified: Secondary | ICD-10-CM | POA: Diagnosis not present

## 2018-04-12 DIAGNOSIS — D709 Neutropenia, unspecified: Secondary | ICD-10-CM

## 2018-04-12 DIAGNOSIS — Z95828 Presence of other vascular implants and grafts: Secondary | ICD-10-CM

## 2018-04-12 LAB — CBC WITH DIFFERENTIAL/PLATELET
Basophils Absolute: 0 10*3/uL (ref 0.0–0.1)
Basophils Relative: 2 %
EOS ABS: 0.1 10*3/uL (ref 0.0–0.5)
Eosinophils Relative: 4 %
HCT: 32.4 % — ABNORMAL LOW (ref 34.8–46.6)
Hemoglobin: 10.8 g/dL — ABNORMAL LOW (ref 11.6–15.9)
LYMPHS ABS: 0.5 10*3/uL — AB (ref 0.9–3.3)
Lymphocytes Relative: 34 %
MCH: 34.5 pg — AB (ref 25.1–34.0)
MCHC: 33.4 g/dL (ref 31.5–36.0)
MCV: 103.1 fL — ABNORMAL HIGH (ref 79.5–101.0)
MONOS PCT: 6 %
Monocytes Absolute: 0.1 10*3/uL (ref 0.1–0.9)
Neutro Abs: 0.7 10*3/uL — ABNORMAL LOW (ref 1.5–6.5)
Neutrophils Relative %: 54 %
Platelets: 110 10*3/uL — ABNORMAL LOW (ref 145–400)
RBC: 3.14 MIL/uL — ABNORMAL LOW (ref 3.70–5.45)
RDW: 17.9 % — AB (ref 11.2–14.5)
WBC: 1.4 10*3/uL — ABNORMAL LOW (ref 3.9–10.3)

## 2018-04-12 LAB — COMPREHENSIVE METABOLIC PANEL
ALT: 19 U/L (ref 0–44)
ANION GAP: 7 (ref 5–15)
AST: 15 U/L (ref 15–41)
Albumin: 4.2 g/dL (ref 3.5–5.0)
Alkaline Phosphatase: 98 U/L (ref 38–126)
BUN: 20 mg/dL (ref 8–23)
CALCIUM: 9.7 mg/dL (ref 8.9–10.3)
CO2: 29 mmol/L (ref 22–32)
Chloride: 103 mmol/L (ref 98–111)
Creatinine, Ser: 0.93 mg/dL (ref 0.44–1.00)
GFR calc non Af Amer: 60 mL/min — ABNORMAL LOW (ref >60)
Glucose, Bld: 90 mg/dL (ref 70–99)
Potassium: 4.5 mmol/L (ref 3.5–5.1)
SODIUM: 139 mmol/L (ref 135–145)
Total Bilirubin: 0.4 mg/dL (ref 0.3–1.2)
Total Protein: 7.6 g/dL (ref 6.5–8.1)

## 2018-04-12 MED ORDER — ALTEPLASE 2 MG IJ SOLR
2.0000 mg | Freq: Once | INTRAMUSCULAR | Status: AC
  Administered 2018-04-12: 2 mg
  Filled 2018-04-12: qty 2

## 2018-04-12 MED ORDER — SODIUM CHLORIDE 0.9% FLUSH
10.0000 mL | Freq: Once | INTRAVENOUS | Status: AC
  Administered 2018-04-12: 10 mL
  Filled 2018-04-12: qty 10

## 2018-04-12 NOTE — Progress Notes (Signed)
[name]  Telephone:([phone]) [phone] Fax:(336) [phone]     ID: TIFANI DACK DOB: 1946/07/20  MR#: [id number]  QBH#:[id number]  Patient Care Team: [name], MD as PCP - General [name], [name], MD as Consulting Physician (Oncology) [name], [name], MD as Consulting Physician (Oncology) [name], MD as Consulting Physician (General Surgery) OTHER MD:  CHIEF COMPLAINT: Triple negative breast cancer  CURRENT TREATMENT: Neoadjuvant chemotherapy    INTERVAL HISTORY: Veronica Price returns today for follow up of her triple negative breast cancer, accompanied by her husband.  [name] is doing well today.  She is here today for evaluation after receiving her first of four cycles of neoadjuvant chemotherapy with Doxorubicin and Cyclophosphamide with Neulasta support.  She receives this on day 1 of a 21 day cycle.  Today she is cycle 1 day 8.    REVIEW OF SYSTEMS: Zaynab is feeling moderately well.  She is more fatigued than she was with her last treatment. She has noted a mild intermittent headache since receiving chemotherapy.  She hasn't taken anything for the headache though, because she isn't sure what to take.    [name] has experienced diarrhea last week.  She had it from Monday 7/1 through Thursday, 7/4.  She did not take anything for the diarrhea.  She estimates about 2 loose bowel movements per day.    She is drinking about 796m per day.  She denies nausea and vomiting, though her stomach hasn't felt right.  No decrease in appetite though.  The numbness and tingling in her left foot is improving.  It isn't noticeable today, however at night she does seem to notice it.    [name]'s appetite is good. She denies any mouth sores or dysphagia.  She is without fevers, chills or vision changes.  She denies a cough, chest pain, pleurisy, palpitations or swelling.  A detailed ROS was entirely stable except what is noted above.    HISTORY OF CURRENT  ILLNESS: MAURA BIBBYinitially palpated a mass to her right breast in February 2019.   She brought her to her primary care physician's attention and was set up for bilateral diagnostic mammography with tomography and right breast ultrasonography on 11/25/2017 showing: a 3.2 x 3.7 cm lobulated mass in the UOQ of the right breast; there was a 3 cm calcified retroareolar mass in the left breast. On right breast ultrasonography there was a complex mostly solid 2.4 x 3.1 cm mass at 10 o'clock and multiple enlarged right axillary lymph nodes, the largest measuring 1.6 x 2.4.  Accordingly on 11/27/2017 the patient proceeded to biopsy of the right breast area in question, as well as an attempted axillary node biopsy. The pathology from this procedure showed (([id number]:  high grade infiltrating ductal carcinoma with no lymphovascular invasion.  The attempted axillary node biopsy showed fat, no nodal tissue.  Prognostic indicators were: estrogen and progesterone receptor negative,. Proliferation marker Ki67 at >80%. HER2 negative by immunohistochemistry at 0%  CT scans of the chest abdomen and pelvis completed on 12/10/2017 showed A complex solid and cystic 3.5 cm right breast mass with several bilateral axillary lymph nodes, the largest in the right axilla measured 12 mm. There was no evidence of visceral metastatic disease.   She had a bone scan on 12/10/2017 that showed two areas of increased uptake overlying the lateral aspects of the T11 vertebrae. Review of the CT scan of the chest revealed sclerosis of the T11 pedicles concerning for bone metastases.  However subsequent spinal MRI (I do not have that report) read this out as arthritis, not a metastatic deposit  The patient had a echocardiogram completed on 3/72019 with results showing: left ventricular ejection fraction of 55-60% with mild concentric hypertrophy. There was mild to moderate mitral and mild tricuspid regurgitation with mild pulmonary  hypertension. The aortic valve was mildly thickened with trace aortic regurgitation.   The patient's subsequent history is as detailed below.  PAST MEDICAL HISTORY: Past Medical History:  Diagnosis Date  . Arthritis   . Breast cancer ([place])    Right breast  . Cataract    "early" per patient  . GERD (gastroesophageal reflux disease)    occasionaly tums    PAST SURGICAL HISTORY: Past Surgical History:  Procedure Laterality Date  . ABDOMINAL HYSTERECTOMY     Total hysterectomy in 2002  . PORTACATH PLACEMENT N/A 12/29/2017   Procedure: INSERTION PORT-A-CATH LEFT SUBCLAVIAN;  Surgeon: [name], MD;  Location: [facility];  Service: General;  Laterality: N/A;    FAMILY HISTORY Family History  Problem Relation Age of Onset  . Diabetes Father   . Heart attack Father    She notes that her father died from a possible MI in his early 65's Her mother is still alive and will be 32 March 2019!  The patient has 2 brothers and 2 sisters. Her brother had prostate cancer possibly agent orange related. She has a cousin who was recently diagnosed with stomach cancer. Her maternal grandmother died from colon cancer. She has a maternal uncle with bone cancer. She denies family hx of breast or ovarian cancer.    GYNECOLOGIC HISTORY:  Menarche: 72 years old Age at first live birth: 72 years old [place] P3 LMP: at age 21 Contraceptive: OCP HRT   Hysterectomy? 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 She also worked at [employer]. She lives at home with her husband, Gwyndolyn Saxon who is a retired Building control surveyor, her husband works part time driving an adult day care bus. Her daughter, [name] is a Multimedia programmer. Her daughter [name] lives in [place], [place] and works as a Therapist, sports for the [place]. Her son [name], lives in [place], and works as a Administrator.  The patient has 3 grandchildren and no  great-grandchildren. She is of baptist faith.    ADVANCED DIRECTIVES:    HEALTH MAINTENANCE: Social History   Tobacco Use  . Smoking status: Never Smoker  . Smokeless tobacco: Never Used  Substance Use Topics  . Alcohol use: Never    Frequency: Never  . Drug use: Never     Colonoscopy: UTD; last year (2018)  PAP:  Bone density:    Allergies  Allergen Reactions  . Prednisone Swelling and Other (See Comments)    " I think it was this that made my tongue swell "    Current Outpatient Medications  Medication Sig Dispense Refill  . HYDROcodone-acetaminophen (NORCO) 5-325 MG tablet Take 1-2 tablets by mouth every 6 (six) hours as needed for moderate pain or severe pain. 20 tablet 0  . LORazepam (ATIVAN) 0.5 MG tablet Take 1 tablet (0.5 mg total) by mouth at bedtime as needed (Nausea or vomiting). 30 tablet 0  . naproxen sodium (ALEVE) 220 MG tablet Take 220 mg by mouth daily as needed (for pain).    . prochlorperazine (COMPAZINE) 10 MG tablet Take 10 mg by  mouth every 6 (six) hours as needed for nausea or vomiting.     No current facility-administered medications for this visit.     OBJECTIVE: Vitals:   04/12/18 0925  BP: (!) 142/79  Pulse: 76  Resp: 18  Temp: 98 F (36.7 C)  SpO2: 100%     Body mass index is 31.37 kg/m.   Wt Readings from Last 3 Encounters:  04/12/18 171 lb 8 oz (77.8 kg)  04/05/18 173 lb (78.5 kg)  03/22/18 172 lb 12.8 oz (78.4 kg)  ECOG FS:1 - Symptomatic but completely ambulatory GENERAL: Patient is a well appearing female in no acute distress HEENT:  Sclerae anicteric.  Oropharynx clear and moist. No ulcerations or evidence of oropharyngeal candidiasis. Neck is supple.  BREAST EXAM:  Deferred. LUNGS:  Clear to auscultation bilaterally.  No wheezes or rhonchi. HEART:  Regular rate and rhythm. No murmur appreciated. ABDOMEN:  Soft, nontender.  Positive, normoactive bowel sounds. No organomegaly palpated. MSK:  No focal spinal tenderness to  palpation. Full range of motion bilaterally in the upper extremities. EXTREMITIES:  No peripheral edema.   SKIN:  Clear with no obvious rashes or skin changes. No nail dyscrasia. NEURO:  Nonfocal. Well oriented.  Appropriate affect.    LAB RESULTS:  CMP     Component Value Date/Time   NA 140 04/05/2018 0748   K 4.3 04/05/2018 0748   CL 105 04/05/2018 0748   CO2 28 04/05/2018 0748   GLUCOSE 91 04/05/2018 0748   BUN 15 04/05/2018 0748   CREATININE 0.81 04/05/2018 0748   CREATININE 0.80 01/11/2018 0747   CALCIUM 9.4 04/05/2018 0748   PROT 7.3 04/05/2018 0748   ALBUMIN 4.1 04/05/2018 0748   AST 30 04/05/2018 0748   AST 17 01/11/2018 0747   ALT 39 04/05/2018 0748   ALT 16 01/11/2018 0747   ALKPHOS 99 04/05/2018 0748   BILITOT 0.4 04/05/2018 0748   BILITOT 0.6 01/11/2018 0747   GFRNONAA >60 04/05/2018 0748   GFRNONAA >60 01/11/2018 0747   GFRAA >60 04/05/2018 0748   GFRAA >60 01/11/2018 0747    No results found for: TOTALPROTELP, ALBUMINELP, A1GS, A2GS, BETS, BETA2SER, GAMS, MSPIKE, SPEI  No results found for: KPAFRELGTCHN, LAMBDASER, KAPLAMBRATIO  Lab Results  Component Value Date   WBC 1.4 (L) 04/12/2018   NEUTROABS 0.7 (L) 04/12/2018   HGB 10.8 (L) 04/12/2018   HCT 32.4 (L) 04/12/2018   MCV 103.1 (H) 04/12/2018   PLT 110 (L) 04/12/2018    _0 @  No results found for: LABCA2  No components found for: EGBTDV761  No results for input(s): INR in the last 168 hours.  No results found for: LABCA2  No results found for: YWV371  No results found for: GGY694  No results found for: WNI627  No results found for: CA2729  No components found for: HGQUANT  No results found for: CEA1 / No results found for: CEA1   No results found for: AFPTUMOR  No results found for: CHROMOGRNA  No results found for: PSA1  Appointment on 04/12/2018  Component Date Value Ref Range Status  . WBC 04/12/2018 1.4* 3.9 - 10.3 K/uL Final  . RBC 04/12/2018 3.14* 3.70 -  5.45 MIL/uL Final  . Hemoglobin 04/12/2018 10.8* 11.6 - 15.9 g/dL Final  . HCT 04/12/2018 32.4* 34.8 - 46.6 % Final  . MCV 04/12/2018 103.1* 79.5 - 101.0 fL Final  . MCH 04/12/2018 34.5* 25.1 - 34.0 pg Final  . MCHC 04/12/2018 33.4  31.5 - 36.0  g/dL Final  . RDW 04/12/2018 17.9* 11.2 - 14.5 % Final  . Platelets 04/12/2018 110* 145 - 400 K/uL Final  . Neutrophils Relative % 04/12/2018 54  % Final  . Neutro Abs 04/12/2018 0.7* 1.5 - 6.5 K/uL Final  . Lymphocytes Relative 04/12/2018 34  % Final  . Lymphs Abs 04/12/2018 0.5* 0.9 - 3.3 K/uL Final  . Monocytes Relative 04/12/2018 6  % Final  . Monocytes Absolute 04/12/2018 0.1  0.1 - 0.9 K/uL Final  . Eosinophils Relative 04/12/2018 4  % Final  . Eosinophils Absolute 04/12/2018 0.1  0.0 - 0.5 K/uL Final  . Basophils Relative 04/12/2018 2  % Final  . Basophils Absolute 04/12/2018 0.0  0.0 - 0.1 K/uL Final   Performed at [facility], [place] [address] [place], [place] [address]    (this displays the last labs from the last 3 days)  No results found for: TOTALPROTELP, ALBUMINELP, A1GS, A2GS, BETS, BETA2SER, GAMS, MSPIKE, SPEI (this displays SPEP labs)  No results found for: KPAFRELGTCHN, LAMBDASER, KAPLAMBRATIO (kappa/lambda light chains)  No results found for: HGBA, HGBA2QUANT, HGBFQUANT, HGBSQUAN (Hemoglobinopathy evaluation)   No results found for: LDH  No results found for: IRON, TIBC, IRONPCTSAT (Iron and TIBC)  No results found for: FERRITIN  Urinalysis No results found for: COLORURINE, APPEARANCEUR, LABSPEC, PHURINE, GLUCOSEU, HGBUR, BILIRUBINUR, KETONESUR, PROTEINUR, UROBILINOGEN, NITRITE, LEUKOCYTESUR   STUDIES: No results found.  ELIGIBLE FOR AVAILABLE RESEARCH PROTOCOL: Consider SWOG [phone] post-op  ASSESSMENT: 72 y.o. [name], [place] woman status post right breast upper outer quadrant biopsy 11/25/2017 for a clinical T2 N2, stage IIIc invasive ductal carcinoma, grade 3, triple negative, with an MIB-1  of 80%.  (1) staging studies:  (a) chest CT 12/10/2017 scan showed no visceral metastatic disease  (b) bone scan 12/10/2017 showed uptake at T11, with sclerosis.  (c) CA-27-29 on 12/05/2017 was 19.0  (d) spinal MRI in [place] reportedly showed only arthrtis at T11  (2) neoadjuvant chemotherapy consisting of carboplatin/ paclitaxel x 12 starting 01/04/2018, discontinued 03/15/2018, followed by cyclophosphamide and doxorubicin given every 3 weeks x 4 starting 04/05/2018   (a) carboplatin/paclitaxel changed to carboplatin/gemcitabine after 9 cycles of Taxol/Carboplatin due to neuroapthy  (b) only able to tolerate 1 cycle of Gemcitabine/Carboplatin due to rash  (a) echocardiogram on 04/02/18 demonstrates a LVEF of 60-65%  (3) definitive surgery to follow  (4) adjuvant radiation as appropriate   PLAN:   Nardos is doing well today.  Her labs indicate a mild neutropenia.  Precautions were reviewed.  She was scheduled for fluids today, however is eating and drinking well and will not need these.  It sounds as if she tolerated chemotherapy relatively well.    I counseled Cyrene to take Aleve if needed for her headache and see how she does with that.  Sometimes the headache, may be related to the [place], and if the headache continues or worsens with the next cycle, it may need to be substituted with a different anti emetic.    Carren has an upcoming trip to [place] on 7/21.  She was considering getting chemotherapy on 7/22 when due and then traveling out of town.  I reviewed this with Dr. Jana Hakim, who recommends we delay her next chemotherapy cycle until 05/03/18. I updated her schedule accordingly.  I reviewed her treatment plan.    Breelyn will return on 7/29 for labs, f/u, and cycle 2 of Doxorubicin and Cyclophosphamide.  She knows to call for any questions or concerns prior to her  next appointment with [place].   A total of (30) minutes of face-to-face time was spent with this patient with  greater than 50% of that time in counseling and care-coordination.    Wilber Bihari, NP 04/12/2018  Medical Oncology and Hematology [facility] [address] [place], [place] [address] Tel. [phone]    Fax. [phone]

## 2018-04-12 NOTE — Progress Notes (Signed)
Port-A-Cath accessed. Appox 4 mls of blood return noted, no more blood return noted. Site flushes well with NS. Swelling or pain noted upon flushing. Cath-Flo instilled per policy, site marked, will monitor for blood return.

## 2018-04-12 NOTE — Telephone Encounter (Signed)
Gave patient avs and calendar of upcoming appts.  °

## 2018-04-19 ENCOUNTER — Other Ambulatory Visit: Payer: Medicare Other

## 2018-04-19 ENCOUNTER — Ambulatory Visit: Payer: Medicare Other | Admitting: Adult Health

## 2018-04-19 ENCOUNTER — Ambulatory Visit: Payer: Medicare Other

## 2018-04-23 DIAGNOSIS — M25562 Pain in left knee: Secondary | ICD-10-CM | POA: Diagnosis not present

## 2018-04-23 DIAGNOSIS — M25561 Pain in right knee: Secondary | ICD-10-CM | POA: Diagnosis not present

## 2018-04-26 ENCOUNTER — Ambulatory Visit: Payer: Medicare Other

## 2018-04-26 ENCOUNTER — Ambulatory Visit: Payer: Medicare Other | Admitting: Adult Health

## 2018-04-26 ENCOUNTER — Other Ambulatory Visit: Payer: Medicare Other

## 2018-05-03 ENCOUNTER — Inpatient Hospital Stay (HOSPITAL_BASED_OUTPATIENT_CLINIC_OR_DEPARTMENT_OTHER): Payer: Medicare Other | Admitting: Adult Health

## 2018-05-03 ENCOUNTER — Encounter: Payer: Self-pay | Admitting: Adult Health

## 2018-05-03 ENCOUNTER — Telehealth: Payer: Self-pay | Admitting: Adult Health

## 2018-05-03 ENCOUNTER — Other Ambulatory Visit: Payer: Self-pay | Admitting: Hematology and Oncology

## 2018-05-03 ENCOUNTER — Inpatient Hospital Stay: Payer: Medicare Other

## 2018-05-03 VITALS — BP 160/73 | HR 63 | Temp 97.5°F | Resp 18 | Ht 62.0 in | Wt 173.2 lb

## 2018-05-03 DIAGNOSIS — Z171 Estrogen receptor negative status [ER-]: Principal | ICD-10-CM

## 2018-05-03 DIAGNOSIS — R197 Diarrhea, unspecified: Secondary | ICD-10-CM | POA: Diagnosis not present

## 2018-05-03 DIAGNOSIS — C50411 Malignant neoplasm of upper-outer quadrant of right female breast: Secondary | ICD-10-CM

## 2018-05-03 DIAGNOSIS — R51 Headache: Secondary | ICD-10-CM | POA: Diagnosis not present

## 2018-05-03 DIAGNOSIS — Z95828 Presence of other vascular implants and grafts: Secondary | ICD-10-CM

## 2018-05-03 DIAGNOSIS — D709 Neutropenia, unspecified: Secondary | ICD-10-CM | POA: Diagnosis not present

## 2018-05-03 DIAGNOSIS — Z5111 Encounter for antineoplastic chemotherapy: Secondary | ICD-10-CM | POA: Diagnosis not present

## 2018-05-03 DIAGNOSIS — R5383 Other fatigue: Secondary | ICD-10-CM | POA: Diagnosis not present

## 2018-05-03 LAB — CBC WITH DIFFERENTIAL/PLATELET
Basophils Absolute: 0.1 10*3/uL (ref 0.0–0.1)
Basophils Relative: 1 %
EOS ABS: 0.1 10*3/uL (ref 0.0–0.5)
Eosinophils Relative: 2 %
HCT: 32.5 % — ABNORMAL LOW (ref 34.8–46.6)
HEMOGLOBIN: 10.9 g/dL — AB (ref 11.6–15.9)
LYMPHS ABS: 0.6 10*3/uL — AB (ref 0.9–3.3)
LYMPHS PCT: 16 %
MCH: 35.3 pg — AB (ref 25.1–34.0)
MCHC: 33.7 g/dL (ref 31.5–36.0)
MCV: 104.9 fL — AB (ref 79.5–101.0)
Monocytes Absolute: 1.2 10*3/uL — ABNORMAL HIGH (ref 0.1–0.9)
Monocytes Relative: 29 %
NEUTROS ABS: 2.1 10*3/uL (ref 1.5–6.5)
NEUTROS PCT: 52 %
Platelets: 277 10*3/uL (ref 145–400)
RBC: 3.09 MIL/uL — AB (ref 3.70–5.45)
RDW: 16.6 % — ABNORMAL HIGH (ref 11.2–14.5)
WBC: 4.1 10*3/uL (ref 3.9–10.3)

## 2018-05-03 LAB — COMPREHENSIVE METABOLIC PANEL
ALT: 22 U/L (ref 0–44)
ANION GAP: 8 (ref 5–15)
AST: 21 U/L (ref 15–41)
Albumin: 3.9 g/dL (ref 3.5–5.0)
Alkaline Phosphatase: 67 U/L (ref 38–126)
BUN: 15 mg/dL (ref 8–23)
CHLORIDE: 105 mmol/L (ref 98–111)
CO2: 27 mmol/L (ref 22–32)
Calcium: 9.3 mg/dL (ref 8.9–10.3)
Creatinine, Ser: 0.86 mg/dL (ref 0.44–1.00)
GFR calc Af Amer: >60 mL/min (ref >60)
Glucose, Bld: 94 mg/dL (ref 70–99)
POTASSIUM: 4.3 mmol/L (ref 3.5–5.1)
Sodium: 140 mmol/L (ref 135–145)
Total Bilirubin: 0.3 mg/dL (ref 0.3–1.2)
Total Protein: 7.2 g/dL (ref 6.5–8.1)

## 2018-05-03 MED ORDER — PEGFILGRASTIM 6 MG/0.6ML ~~LOC~~ PSKT
6.0000 mg | PREFILLED_SYRINGE | Freq: Once | SUBCUTANEOUS | Status: AC
Start: 2018-05-03 — End: 2018-05-03
  Administered 2018-05-03: 6 mg via SUBCUTANEOUS

## 2018-05-03 MED ORDER — PALONOSETRON HCL INJECTION 0.25 MG/5ML
0.2500 mg | Freq: Once | INTRAVENOUS | Status: AC
  Administered 2018-05-03: 0.25 mg via INTRAVENOUS

## 2018-05-03 MED ORDER — FOSAPREPITANT DIMEGLUMINE INJECTION 150 MG
Freq: Once | INTRAVENOUS | Status: AC
  Administered 2018-05-03: 10:00:00 via INTRAVENOUS
  Filled 2018-05-03: qty 5

## 2018-05-03 MED ORDER — HEPARIN SOD (PORK) LOCK FLUSH 100 UNIT/ML IV SOLN
500.0000 [IU] | Freq: Once | INTRAVENOUS | Status: AC | PRN
Start: 2018-05-03 — End: 2018-05-03
  Administered 2018-05-03: 500 [IU]
  Filled 2018-05-03: qty 5

## 2018-05-03 MED ORDER — PEGFILGRASTIM 6 MG/0.6ML ~~LOC~~ PSKT
PREFILLED_SYRINGE | SUBCUTANEOUS | Status: AC
Start: 2018-05-03 — End: ?
  Filled 2018-05-03: qty 0.6

## 2018-05-03 MED ORDER — PALONOSETRON HCL INJECTION 0.25 MG/5ML
INTRAVENOUS | Status: AC
  Filled 2018-05-03: qty 5

## 2018-05-03 MED ORDER — SODIUM CHLORIDE 0.9 % IV SOLN
Freq: Once | INTRAVENOUS | Status: AC
Start: 2018-05-03 — End: 2018-05-03
  Administered 2018-05-03: 10:00:00 via INTRAVENOUS
  Filled 2018-05-03: qty 250

## 2018-05-03 MED ORDER — SODIUM CHLORIDE 0.9% FLUSH
10.0000 mL | Freq: Once | INTRAVENOUS | Status: AC
  Administered 2018-05-03: 10 mL
  Filled 2018-05-03: qty 10

## 2018-05-03 MED ORDER — DOXORUBICIN HCL CHEMO IV INJECTION 2 MG/ML
60.0000 mg/m2 | Freq: Once | INTRAVENOUS | Status: AC
  Administered 2018-05-03: 112 mg via INTRAVENOUS
  Filled 2018-05-03: qty 56

## 2018-05-03 MED ORDER — SODIUM CHLORIDE 0.9 % IV SOLN
600.0000 mg/m2 | Freq: Once | INTRAVENOUS | Status: AC
  Administered 2018-05-03: 1120 mg via INTRAVENOUS
  Filled 2018-05-03: qty 56

## 2018-05-03 MED ORDER — SODIUM CHLORIDE 0.9% FLUSH
10.0000 mL | INTRAVENOUS | Status: DC | PRN
  Administered 2018-05-03: 10 mL
  Filled 2018-05-03: qty 10

## 2018-05-03 NOTE — Telephone Encounter (Signed)
No 7/29 orders or referrals per los.

## 2018-05-03 NOTE — Patient Instructions (Signed)
[facility] Discharge Instructions for Patients Receiving Chemotherapy  Today you received the following chemotherapy agents Adriamycin and Cytoxan  To help prevent nausea and vomiting after your treatment, we encourage you to take your nausea medication as directed.  If you develop nausea and vomiting that is not controlled by your nausea medication, call the clinic.   BELOW ARE SYMPTOMS THAT SHOULD BE REPORTED IMMEDIATELY:  *FEVER GREATER THAN 100.5 F  *CHILLS WITH OR WITHOUT FEVER  NAUSEA AND VOMITING THAT IS NOT CONTROLLED WITH YOUR NAUSEA MEDICATION  *UNUSUAL SHORTNESS OF BREATH  *UNUSUAL BRUISING OR BLEEDING  TENDERNESS IN MOUTH AND THROAT WITH OR WITHOUT PRESENCE OF ULCERS  *URINARY PROBLEMS  *BOWEL PROBLEMS  UNUSUAL RASH Items with * indicate a potential emergency and should be followed up as soon as possible.  Feel free to call the clinic should you have any questions or concerns. The clinic phone number is [phone].  Please show the CHEMO ALERT CARD at check-in to the Emergency Department and triage nurse.   

## 2018-05-03 NOTE — Progress Notes (Signed)
[place]  [phone] Fax:([phone]) [phone]     ID: Veronica Price DOB: 1946/10/01  MR#: [id number]  UDJ#:[id number]  Patient Care Team: [name], MD as PCP - General [name], [name], MD as Consulting Physician (Oncology) [name], [name], MD as Consulting Physician (Oncology) [name], MD as Consulting Physician (General Surgery) OTHER MD:  CHIEF COMPLAINT: Triple negative breast cancer  CURRENT TREATMENT: Neoadjuvant chemotherapy    INTERVAL HISTORY: Veronica Price returns today for follow up of her triple negative breast cancer, accompanied by her husband.  Veronica Price is doing well today.  She is here today for evaluation after receiving her first of four cycles of neoadjuvant chemotherapy with Doxorubicin and Cyclophosphamide with Neulasta support.  She receives this on day 1 of a 21 day cycle.  Today she is cycle 2 day 1.    REVIEW OF SYSTEMS: Veronica Price is doing well today. She is returning from her trip to [place] with her husband and their friends.  They had a very nice time.  She denies any issues today such as fevers, chills, headaches, mucositis, fatigue.  She denies headaches, vision changes, taste changes, decreased appetite.  She denies nausea, vomiting, constipation, diarrhea.  She is getting around well, and is feeling well.  She isn't having any cough, chest pain, palpitations, shortness of breath.  A detailed ROS was non contributory today.    HISTORY OF CURRENT ILLNESS: Veronica Price initially palpated a mass to her right breast in February 2019.   She brought her to her primary care physician's attention and was set up for bilateral diagnostic mammography with tomography and right breast ultrasonography on 11/25/2017 showing: a 3.2 x 3.7 cm lobulated mass in the UOQ of the right breast; there was a 3 cm calcified retroareolar mass in the left breast. On right breast ultrasonography there was a complex mostly solid 2.4 x 3.1 cm  mass at 10 o'clock and multiple enlarged right axillary lymph nodes, the largest measuring 1.6 x 2.4.  Accordingly on 11/27/2017 the patient proceeded to biopsy of the right breast area in question, as well as an attempted axillary node biopsy. The pathology from this procedure showed ([id number]):  high grade infiltrating ductal carcinoma with no lymphovascular invasion.  The attempted axillary node biopsy showed fat, no nodal tissue.  Prognostic indicators were: estrogen and progesterone receptor negative,. Proliferation marker Ki67 at >80%. HER2 negative by immunohistochemistry at 0%  CT scans of the chest abdomen and pelvis completed on 12/10/2017 showed A complex solid and cystic 3.5 cm right breast mass with several bilateral axillary lymph nodes, the largest in the right axilla measured 12 mm. There was no evidence of visceral metastatic disease.   She had a bone scan on 12/10/2017 that showed two areas of increased uptake overlying the lateral aspects of the T11 vertebrae. Review of the CT scan of the chest revealed sclerosis of the T11 pedicles concerning for bone metastases.  However subsequent spinal MRI (I do not have that report) read this out as arthritis, not a metastatic deposit  The patient had a echocardiogram completed on 3/72019 with results showing: left ventricular ejection fraction of 55-60% with mild concentric hypertrophy. There was mild to moderate mitral and mild tricuspid regurgitation with mild pulmonary hypertension. The aortic valve was mildly thickened with trace aortic regurgitation.   The patient's subsequent history is as detailed below.  PAST MEDICAL HISTORY: Past Medical History:  Diagnosis Date  . Arthritis   . Breast cancer (  [place])    Right breast  . Cataract    "early" per patient  . GERD (gastroesophageal reflux disease)    occasionaly tums    PAST SURGICAL HISTORY: Past Surgical History:  Procedure Laterality Date  . ABDOMINAL HYSTERECTOMY     Total  hysterectomy in 2002  . PORTACATH PLACEMENT N/A 12/29/2017   Procedure: INSERTION PORT-A-CATH LEFT SUBCLAVIAN;  Surgeon: [name], MD;  Location: [facility];  Service: General;  Laterality: N/A;    FAMILY HISTORY Family History  Problem Relation Age of Onset  . Diabetes Father   . Heart attack Father    She notes that her father died from a possible MI in his early 60's Her mother is still alive and will be 61 March 2019!  The patient has 2 brothers and 2 sisters. Her brother had prostate cancer possibly agent orange related. She has a cousin who was recently diagnosed with stomach cancer. Her maternal grandmother died from colon cancer. She has a maternal uncle with bone cancer. She denies family hx of breast or ovarian cancer.    GYNECOLOGIC HISTORY:  Menarche: 72 years old Age at first live birth: 72 years old Loco P3 LMP: at age 32 Contraceptive: OCP HRT   Hysterectomy? 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 She also worked at [employer]. She lives at home with her husband, Gwyndolyn Saxon who is a retired Building control surveyor, her husband works part time driving an adult day care bus. Her daughter, [name] is a Multimedia programmer. Her daughter [name] lives in [place], [place] and works as a Therapist, sports for the [place]. Her son Ivey Nembhard, lives in [place], and works as a Administrator.  The patient has 3 grandchildren and no great-grandchildren. She is of baptist faith.    ADVANCED DIRECTIVES:    HEALTH MAINTENANCE: Social History   Tobacco Use  . Smoking status: Never Smoker  . Smokeless tobacco: Never Used  Substance Use Topics  . Alcohol use: Never    Frequency: Never  . Drug use: Never     Colonoscopy: UTD; last year (2018)  PAP:  Bone density:    Allergies  Allergen Reactions  . Prednisone Swelling and Other (See Comments)    " I think it was this that made my tongue swell "     Current Outpatient Medications  Medication Sig Dispense Refill  . HYDROcodone-acetaminophen (NORCO) 5-325 MG tablet Take 1-2 tablets by mouth every 6 (six) hours as needed for moderate pain or severe pain. 20 tablet 0  . LORazepam (ATIVAN) 0.5 MG tablet Take 1 tablet (0.5 mg total) by mouth at bedtime as needed (Nausea or vomiting). 30 tablet 0  . naproxen sodium (ALEVE) 220 MG tablet Take 220 mg by mouth daily as needed (for pain).    . prochlorperazine (COMPAZINE) 10 MG tablet Take 10 mg by mouth every 6 (six) hours as needed for nausea or vomiting.     No current facility-administered medications for this visit.     OBJECTIVE: Vitals:   05/03/18 0838  BP: (!) 160/73  Pulse: 63  Resp: 18  Temp: (!) 97.5 F (36.4 C)  SpO2: 100%     Body mass index is 31.68 kg/m.   Wt Readings from Last 3 Encounters:  05/03/18 173 lb 3.2 oz (78.6 kg)  04/12/18 171 lb 8 oz (77.8 kg)  04/05/18 173 lb (78.5 kg)  ECOG FS:1 - Symptomatic but completely ambulatory GENERAL: Patient is a well appearing female in no acute distress HEENT:  Sclerae anicteric.  Oropharynx clear and moist. No ulcerations or evidence of oropharyngeal candidiasis. Neck is supple.  BREAST EXAM:  Deferred. LUNGS:  Clear to auscultation bilaterally.  No wheezes or rhonchi. HEART:  Regular rate and rhythm. No murmur appreciated. ABDOMEN:  Soft, nontender.  Positive, normoactive bowel sounds. No organomegaly palpated. MSK:  No focal spinal tenderness to palpation. Full range of motion bilaterally in the upper extremities. EXTREMITIES:  No peripheral edema.   SKIN:  Clear with no obvious rashes or skin changes. No nail dyscrasia. NEURO:  Nonfocal. Well oriented.  Appropriate affect.    LAB RESULTS:  CMP     Component Value Date/Time   NA 140 05/03/2018 0826   K 4.3 05/03/2018 0826   CL 105 05/03/2018 0826   CO2 27 05/03/2018 0826   GLUCOSE 94 05/03/2018 0826   BUN 15 05/03/2018 0826   CREATININE 0.86 05/03/2018  0826   CREATININE 0.80 01/11/2018 0747   CALCIUM 9.3 05/03/2018 0826   PROT 7.2 05/03/2018 0826   ALBUMIN 3.9 05/03/2018 0826   AST 21 05/03/2018 0826   AST 17 01/11/2018 0747   ALT 22 05/03/2018 0826   ALT 16 01/11/2018 0747   ALKPHOS 67 05/03/2018 0826   BILITOT 0.3 05/03/2018 0826   BILITOT 0.6 01/11/2018 0747   GFRNONAA >60 05/03/2018 0826   GFRNONAA >60 01/11/2018 0747   GFRAA >60 05/03/2018 0826   GFRAA >60 01/11/2018 0747    No results found for: TOTALPROTELP, ALBUMINELP, A1GS, A2GS, BETS, BETA2SER, GAMS, MSPIKE, SPEI  No results found for: KPAFRELGTCHN, LAMBDASER, KAPLAMBRATIO  Lab Results  Component Value Date   WBC 4.1 05/03/2018   NEUTROABS 2.1 05/03/2018   HGB 10.9 (L) 05/03/2018   HCT 32.5 (L) 05/03/2018   MCV 104.9 (H) 05/03/2018   PLT 277 05/03/2018    _0 @  No results found for: LABCA2  No components found for: GYJEHU314  No results for input(s): INR in the last 168 hours.  No results found for: LABCA2  No results found for: HFW263  No results found for: ZCH885  No results found for: OYD741  No results found for: CA2729  No components found for: HGQUANT  No results found for: CEA1 / No results found for: CEA1   No results found for: AFPTUMOR  No results found for: CHROMOGRNA  No results found for: PSA1  Appointment on 05/03/2018  Component Date Value Ref Range Status  . Sodium 05/03/2018 140  135 - 145 mmol/L Final  . Potassium 05/03/2018 4.3  3.5 - 5.1 mmol/L Final  . Chloride 05/03/2018 105  98 - 111 mmol/L Final  . CO2 05/03/2018 27  22 - 32 mmol/L Final  . Glucose, Bld 05/03/2018 94  70 - 99 mg/dL Final  . BUN 05/03/2018 15  8 - 23 mg/dL Final  . Creatinine, Ser 05/03/2018 0.86  0.44 - 1.00 mg/dL Final  . Calcium 05/03/2018 9.3  8.9 - 10.3 mg/dL Final  . Total Protein 05/03/2018 7.2  6.5 - 8.1 g/dL Final  . Albumin 05/03/2018 3.9  3.5 - 5.0 g/dL Final  . AST 05/03/2018 21  15 - 41 U/L Final  . ALT 05/03/2018 22  0  - 44 U/L Final  . Alkaline Phosphatase 05/03/2018 67  38 - 126 U/L Final  . Total Bilirubin 05/03/2018 0.3  0.3 - 1.2 mg/dL Final  . GFR calc non  Af Amer 05/03/2018 >60  >60 mL/min Final  . GFR calc Af Amer 05/03/2018 >60  >60 mL/min Final   Comment: (NOTE) The eGFR has been calculated using the CKD EPI equation. This calculation has not been validated in all clinical situations. eGFR's persistently <60 mL/min signify possible Chronic Kidney Disease.   . Anion gap 05/03/2018 8  5 - 15 Final   Performed at [facility], [address] [place], [place] [address]  . WBC 05/03/2018 4.1  3.9 - 10.3 K/uL Final  . RBC 05/03/2018 3.09* 3.70 - 5.45 MIL/uL Final  . Hemoglobin 05/03/2018 10.9* 11.6 - 15.9 g/dL Final  . HCT 05/03/2018 32.5* 34.8 - 46.6 % Final  . MCV 05/03/2018 104.9* 79.5 - 101.0 fL Final  . MCH 05/03/2018 35.3* 25.1 - 34.0 pg Final  . MCHC 05/03/2018 33.7  31.5 - 36.0 g/dL Final  . RDW 05/03/2018 16.6* 11.2 - 14.5 % Final  . Platelets 05/03/2018 277  145 - 400 K/uL Final  . Neutrophils Relative % 05/03/2018 52  % Final  . Neutro Abs 05/03/2018 2.1  1.5 - 6.5 K/uL Final  . Lymphocytes Relative 05/03/2018 16  % Final  . Lymphs Abs 05/03/2018 0.6* 0.9 - 3.3 K/uL Final  . Monocytes Relative 05/03/2018 29  % Final  . Monocytes Absolute 05/03/2018 1.2* 0.1 - 0.9 K/uL Final  . Eosinophils Relative 05/03/2018 2  % Final  . Eosinophils Absolute 05/03/2018 0.1  0.0 - 0.5 K/uL Final  . Basophils Relative 05/03/2018 1  % Final  . Basophils Absolute 05/03/2018 0.1  0.0 - 0.1 K/uL Final   Performed at [facility], [address] [place], [place] [address]    (this displays the last labs from the last 3 days)  No results found for: TOTALPROTELP, ALBUMINELP, A1GS, A2GS, BETS, BETA2SER, GAMS, MSPIKE, SPEI (this displays SPEP labs)  No results found for: KPAFRELGTCHN, LAMBDASER, KAPLAMBRATIO (kappa/lambda light chains)  No results  found for: HGBA, HGBA2QUANT, HGBFQUANT, HGBSQUAN (Hemoglobinopathy evaluation)   No results found for: LDH  No results found for: IRON, TIBC, IRONPCTSAT (Iron and TIBC)  No results found for: FERRITIN  Urinalysis No results found for: COLORURINE, APPEARANCEUR, LABSPEC, PHURINE, GLUCOSEU, HGBUR, BILIRUBINUR, KETONESUR, PROTEINUR, UROBILINOGEN, NITRITE, LEUKOCYTESUR   STUDIES: No results found.  ELIGIBLE FOR AVAILABLE RESEARCH PROTOCOL: Consider SWOG S1418 post-op  ASSESSMENT: 71 y.o. Veronica Price, [facility] [place] status post right breast upper outer quadrant biopsy 11/25/2017 for a clinical T2 N2, stage IIIc invasive ductal carcinoma, grade 3, triple negative, with an MIB-1 of 80%.  (1) staging studies:  (a) chest CT 12/10/2017 scan showed no visceral metastatic disease  (b) bone scan 12/10/2017 showed uptake at T11, with sclerosis.  (c) CA-27-29 on 12/05/2017 was 19.0  (d) spinal MRI in [place] reportedly showed only arthrtis at T11  (2) neoadjuvant chemotherapy consisting of carboplatin/ paclitaxel x 12 starting 01/04/2018, discontinued 03/15/2018, followed by cyclophosphamide and doxorubicin given every 3 weeks x 4 starting 04/05/2018   (a) carboplatin/paclitaxel changed to carboplatin/gemcitabine after 9 cycles of Taxol/Carboplatin due to neuroapthy  (b) only able to tolerate 1 cycle of Gemcitabine/Carboplatin due to rash  (a) echocardiogram on 04/02/18 demonstrates a LVEF of 60-65%  (3) definitive surgery to follow  (4) adjuvant radiation as appropriate   PLAN:   Veronica Price is doing well today. I reviewed her labs with her in detail.  They are stable.  She will proceed with her second cycle of Doxorubicin and Cyclophosphamide.  She is   tolerating it well.    She will return next week for labs and f/u with Dr. Jana Hakim.  She knows to call between now and her next appointment for any questions or concerns.    A total of (20) minutes of face-to-face time was spent with this patient with  greater than 50% of that time in counseling and care-coordination.    Wilber Bihari, NP 05/03/2018  Medical Oncology and Hematology [facility] [address] [place], [place] [address] Tel. [phone]    Fax. [phone]

## 2018-05-09 NOTE — Progress Notes (Signed)
[facility]  [phone] [phone]     ID: Veronica Price DOB: [birth year]  MR#: [id number]  WLN#:[id number]  Patient Care Team: [name], MD as PCP - General Leara Rawl, [name], MD as Consulting Physician (Oncology) Cj Edgell, [name], MD as Consulting Physician (Oncology) [name], MD as Consulting Physician (General Surgery) OTHER MD:  CHIEF COMPLAINT: Triple negative breast cancer  CURRENT TREATMENT: Neoadjuvant chemotherapy    INTERVAL HISTORY: Kursten returns today for follow up of her triple negative breast cancer, accompanied by her husband. She is here today for evaluation after receiving 8 cycles of carboplatin and paclitaxel, followed by her first 2 of four cycles of neoadjuvant chemotherapy with Doxorubicin and Cyclophosphamide with Neulasta support.  She receives this on day 1 of a 21 day cycle.  Today she is cycle 2 day 8.   She reports that she feels well following her chemo treatment, however, she notes headaches every 3-4 days that isn't related to her treatments. She also has constant left toe numbness with occasional right toe numbness. She doesn't have any numbness to her hands.   REVIEW OF SYSTEMS: Joley reports that she has had pretty good energy and has been traveling to [place] and [place] with her husband. She denies issues with vomiting, or oral sores. She denies fever, rash, or bleeding. She notes occasional nausea. She denies unusual visual changes, vomiting, or dizziness. There has been no unusual cough, phlegm production, or pleurisy. This been no change in bowel or bladder habits. She denies unexplained fatigue or unexplained weight loss, bleeding, rash, or fever. A detailed review of systems was otherwise stable.       HISTORY OF CURRENT ILLNESS: Veronica Price initially palpated a mass to her right breast in February 2019.   She brought her to her primary care physician's attention and was set  up for bilateral diagnostic mammography with tomography and right breast ultrasonography on 11/25/2017 showing: a 3.2 x 3.7 cm lobulated mass in the UOQ of the right breast; there was a 3 cm calcified retroareolar mass in the left breast. On right breast ultrasonography there was a complex mostly solid 2.4 x 3.1 cm mass at 10 o'clock and multiple enlarged right axillary lymph nodes, the largest measuring 1.6 x 2.4.  Accordingly on 11/27/2017 the patient proceeded to biopsy of the right breast area in question, as well as an attempted axillary node biopsy. The pathology from this procedure showed ([id number]):  high grade infiltrating ductal carcinoma with no lymphovascular invasion.  The attempted axillary node biopsy showed fat, no nodal tissue.  Prognostic indicators were: estrogen and progesterone receptor negative,. Proliferation marker Ki67 at >80%. HER2 negative by immunohistochemistry at 0%  CT scans of the chest abdomen and pelvis completed on 12/10/2017 showed A complex solid and cystic 3.5 cm right breast mass with several bilateral axillary lymph nodes, the largest in the right axilla measured 12 mm. There was no evidence of visceral metastatic disease.   She had a bone scan on 12/10/2017 that showed two areas of increased uptake overlying the lateral aspects of the T11 vertebrae. Review of the CT scan of the chest revealed sclerosis of the T11 pedicles concerning for bone metastases.  However subsequent spinal MRI (I do not have that report) read this out as arthritis, not a metastatic deposit  The patient had a echocardiogram completed on 3/72019 with results showing: left ventricular ejection fraction of 55-60% with mild concentric hypertrophy. There was mild  to moderate mitral and mild tricuspid regurgitation with mild pulmonary hypertension. The aortic valve was mildly thickened with trace aortic regurgitation.   The patient's subsequent history is as detailed below.  PAST MEDICAL  HISTORY: Past Medical History:  Diagnosis Date  . Arthritis   . Breast cancer ([place])    Right breast  . Cataract    "early" per patient  . GERD (gastroesophageal reflux disease)    occasionaly tums    PAST SURGICAL HISTORY: Past Surgical History:  Procedure Laterality Date  . ABDOMINAL HYSTERECTOMY     Total hysterectomy in 2002  . PORTACATH PLACEMENT N/A 12/29/2017   Procedure: INSERTION PORT-A-CATH LEFT SUBCLAVIAN;  Surgeon: [name], MD;  Location: [facility];  Service: General;  Laterality: N/A;    FAMILY HISTORY Family History  Problem Relation Age of Onset  . Diabetes Father   . Heart attack Father    She notes that her father died from a possible MI in his early 75's Her mother is still alive and will be 82 March 2019!  The patient has 2 brothers and 2 sisters. Her brother had prostate cancer possibly agent orange related. She has a cousin who was recently diagnosed with stomach cancer. Her maternal grandmother died from colon cancer. She has a maternal uncle with bone cancer. She denies family hx of breast or ovarian cancer.    GYNECOLOGIC HISTORY:  Menarche: 72 years old Age at first live birth: 72 years old [place] P3 LMP: at age 36 Contraceptive: OCP HRT   Hysterectomy? 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 She also worked at [employer]. She lives at home with her husband, Gwyndolyn Saxon who is a retired Building control surveyor, her husband works part time driving an adult day care bus. Her daughter, Veronica Price is a Multimedia programmer. Her daughter Veronica Price lives in [place], [place] and works as a Therapist, sports for the [place]. Her son Veronica Price, lives in [place], and works as a Administrator.  The patient has 3 grandchildren and no great-grandchildren. She is of baptist faith.    ADVANCED DIRECTIVES:    HEALTH MAINTENANCE: Social History   Tobacco Use  . Smoking status: Never Smoker  .  Smokeless tobacco: Never Used  Substance Use Topics  . Alcohol use: Never    Frequency: Never  . Drug use: Never     Colonoscopy: UTD; last year (2018)  PAP:  Bone density:    Allergies  Allergen Reactions  . Prednisone Swelling and Other (See Comments)    " I think it was this that made my tongue swell "    Current Outpatient Medications  Medication Sig Dispense Refill  . ciprofloxacin (CIPRO) 500 MG tablet Take 1 tablet (500 mg total) by mouth 2 (two) times daily. 10 tablet 1  . HYDROcodone-acetaminophen (NORCO) 5-325 MG tablet Take 1-2 tablets by mouth every 6 (six) hours as needed for moderate pain or severe pain. 20 tablet 0  . LORazepam (ATIVAN) 0.5 MG tablet Take 1 tablet (0.5 mg total) by mouth at bedtime as needed (Nausea or vomiting). 30 tablet 0  . naproxen sodium (ALEVE) 220 MG tablet Take 220 mg by mouth daily as needed (for pain).    . prochlorperazine (COMPAZINE) 10 MG tablet Take 10 mg by mouth every 6 (six) hours as needed for nausea or vomiting.     No current facility-administered medications for this visit.  Facility-Administered Medications Ordered in Other Visits  Medication Dose Route Frequency Provider Last Rate Last Dose  . heparin lock flush 100 unit/mL  250 Units Intracatheter Once Abelardo Seidner, [name], MD      . sodium chloride flush (NS) 0.9 % injection 10 mL  10 mL Intracatheter Once Darline Faith, [name], MD        OBJECTIVE: Older African-American woman in no acute distress Vitals:   05/10/18 0819  BP: (!) 150/66  Pulse: 74  Resp: 18  Temp: 98.1 F (36.7 C)  SpO2: 100%     Body mass index is 31.28 kg/m.   Wt Readings from Last 3 Encounters:  05/10/18 171 lb (77.6 kg)  05/03/18 173 lb 3.2 oz (78.6 kg)  04/12/18 171 lb 8 oz (77.8 kg)  ECOG FS:1 - Symptomatic but completely ambulatory  Sclerae unicteric, EOMs intact No cervical or supraclavicular adenopathy Lungs no rales or rhonchi Heart regular rate and rhythm Abd soft, nontender,  positive bowel sounds MSK no focal spinal tenderness, no upper extremity lymphedema Neuro: nonfocal, well oriented, appropriate affect Breasts: The mass in the right breast laterally, upper outer, is still palpable.  It measures approximately 1-1/2 cm.  It is movable.  There is no skin change overlying the mass.  In the right axilla there is a flat soft lymph node that is palpable.  There are no other skin or nipple changes of concern on the right.  The left side is benign.     LAB RESULTS:  CMP     Component Value Date/Time   NA 138 05/10/2018 0754   K 4.5 05/10/2018 0754   CL 104 05/10/2018 0754   CO2 25 05/10/2018 0754   GLUCOSE 88 05/10/2018 0754   BUN 15 05/10/2018 0754   CREATININE 0.78 05/10/2018 0754   CREATININE 0.80 01/11/2018 0747   CALCIUM 9.5 05/10/2018 0754   PROT 7.2 05/10/2018 0754   ALBUMIN 3.9 05/10/2018 0754   AST 14 (L) 05/10/2018 0754   AST 17 01/11/2018 0747   ALT 18 05/10/2018 0754   ALT 16 01/11/2018 0747   ALKPHOS 89 05/10/2018 0754   BILITOT 0.4 05/10/2018 0754   BILITOT 0.6 01/11/2018 0747   GFRNONAA >60 05/10/2018 0754   GFRNONAA >60 01/11/2018 0747   GFRAA >60 05/10/2018 0754   GFRAA >60 01/11/2018 0747    No results found for: TOTALPROTELP, ALBUMINELP, A1GS, A2GS, BETS, BETA2SER, GAMS, MSPIKE, SPEI  No results found for: KPAFRELGTCHN, LAMBDASER, KAPLAMBRATIO  Lab Results  Component Value Date   WBC 1.2 (L) 05/10/2018   NEUTROABS 0.5 (L) 05/10/2018   HGB 11.0 (L) 05/10/2018   HCT 33.0 (L) 05/10/2018   MCV 104.1 (H) 05/10/2018   PLT 89 (L) 05/10/2018    _0 @  No results found for: LABCA2  No components found for: GYFVCB449  No results for input(s): INR in the last 168 hours.  No results found for: LABCA2  No results found for: QPR916  No results found for: BWG665  No results found for: LDJ570  No results found for: CA2729  No components found for: HGQUANT  No results found for: CEA1 / No results found for:  CEA1   No results found for: AFPTUMOR  No results found for: CHROMOGRNA  No results found for: PSA1  Appointment on 05/10/2018  Component Date Value Ref Range Status  . Sodium 05/10/2018 138  135 - 145 mmol/L Final  . Potassium 05/10/2018 4.5  3.5 - 5.1 mmol/L Final  . Chloride 05/10/2018 104  98 - 111 mmol/L Final  . CO2 05/10/2018 25  22 - 32 mmol/L Final  . Glucose, Bld 05/10/2018 88  70 - 99 mg/dL Final  . BUN 05/10/2018 15  8 - 23 mg/dL Final  . Creatinine, Ser 05/10/2018 0.78  0.44 - 1.00 mg/dL Final  . Calcium 05/10/2018 9.5  8.9 - 10.3 mg/dL Final  . Total Protein 05/10/2018 7.2  6.5 - 8.1 g/dL Final  . Albumin 05/10/2018 3.9  3.5 - 5.0 g/dL Final  . AST 05/10/2018 14* 15 - 41 U/L Final  . ALT 05/10/2018 18  0 - 44 U/L Final  . Alkaline Phosphatase 05/10/2018 89  38 - 126 U/L Final  . Total Bilirubin 05/10/2018 0.4  0.3 - 1.2 mg/dL Final  . GFR calc non Af Amer 05/10/2018 >60  >60 mL/min Final  . GFR calc Af Amer 05/10/2018 >60  >60 mL/min Final   Comment: (NOTE) The eGFR has been calculated using the CKD EPI equation. This calculation has not been validated in all clinical situations. eGFR's persistently <60 mL/min signify possible Chronic Kidney Disease.   Georgiann Hahn gap 05/10/2018 9  5 - 15 Final   Performed at [facility], [place] [address] [place], [place] [address]  . WBC 05/10/2018 1.2* 3.9 - 10.3 K/uL Final  . RBC 05/10/2018 3.17* 3.70 - 5.45 MIL/uL Final  . Hemoglobin 05/10/2018 11.0* 11.6 - 15.9 g/dL Final  . HCT 05/10/2018 33.0* 34.8 - 46.6 % Final  . MCV 05/10/2018 104.1* 79.5 - 101.0 fL Final  . MCH 05/10/2018 34.6* 25.1 - 34.0 pg Final  . MCHC 05/10/2018 33.2  31.5 - 36.0 g/dL Final  . RDW 05/10/2018 15.1* 11.2 - 14.5 % Final  . Platelets 05/10/2018 89* 145 - 400 K/uL Final  . Neutrophils Relative % 05/10/2018 41  % Final  . Neutro Abs 05/10/2018 0.5* 1.5 - 6.5 K/uL Final  . Lymphocytes Relative 05/10/2018 34  % Final  . Lymphs  Abs 05/10/2018 0.4* 0.9 - 3.3 K/uL Final  . Monocytes Relative 05/10/2018 15  % Final  . Monocytes Absolute 05/10/2018 0.2  0.1 - 0.9 K/uL Final  . Eosinophils Relative 05/10/2018 8  % Final  . Eosinophils Absolute 05/10/2018 0.1  0.0 - 0.5 K/uL Final  . Basophils Relative 05/10/2018 2  % Final  . Basophils Absolute 05/10/2018 0.0  0.0 - 0.1 K/uL Final   Performed at [facility], [place] [address] [place], [place] [address]    (this displays the last labs from the last 3 days)  No results found for: TOTALPROTELP, ALBUMINELP, A1GS, A2GS, BETS, BETA2SER, GAMS, MSPIKE, SPEI (this displays SPEP labs)  No results found for: KPAFRELGTCHN, LAMBDASER, KAPLAMBRATIO (kappa/lambda light chains)  No results found for: HGBA, HGBA2QUANT, HGBFQUANT, HGBSQUAN (Hemoglobinopathy evaluation)   No results found for: LDH  No results found for: IRON, TIBC, IRONPCTSAT (Iron and TIBC)  No results found for: FERRITIN  Urinalysis No results found for: COLORURINE, APPEARANCEUR, LABSPEC, PHURINE, GLUCOSEU, HGBUR, BILIRUBINUR, KETONESUR, PROTEINUR, UROBILINOGEN, NITRITE, LEUKOCYTESUR   STUDIES: No results found.  ELIGIBLE FOR AVAILABLE RESEARCH PROTOCOL: Consider SWOG [phone] post-op  ASSESSMENT: 72 y.o. [name], [place] woman status post right breast upper outer quadrant biopsy 11/25/2017 for a clinical T2 N2, stage IIIc invasive ductal carcinoma, grade 3, triple negative, with an MIB-1 of 80%.  (1) staging studies:  (a) chest CT 12/10/2017 scan showed no visceral metastatic disease  (b) bone scan 12/10/2017 showed uptake at T11, with sclerosis.  (  c) CA-27-29 on 12/05/2017 was 19.0  (d) spinal MRI in [place] reportedly showed only arthrtis at T11  (2) neoadjuvant chemotherapy consisting of carboplatin/ paclitaxel x 12 starting 01/04/2018, discontinued 03/15/2018, followed by cyclophosphamide and doxorubicin given every 3 weeks x 4 starting 04/05/2018   (a) carboplatin/paclitaxel  changed to carboplatin/gemcitabine after 9 cycles of Taxol/Carboplatin due to neuroapthy  (b) only able to tolerate 1 cycle of Gemcitabine/Carboplatin due to rash  (a) echocardiogram on 04/02/18 demonstrates a LVEF of 60-65%  (3) definitive surgery to follow  (4) adjuvant radiation as appropriate   PLAN:   Rihana continues to do remarkably well with her chemotherapy.  It is unfortunate that she will lose her hair again.  She was very excited that her hair was growing back.  I am putting her on Cipro for the next 5 days prophylactically.  We will do that after cycle 3 and 4 again assuming her counts are similar.  She understands the need to call us if she develops a temperature of 100 degrees or more.  I can still feel the mass and therefore very likely they will be some residual disease at the time of surgery.  This may qualify her for the pembrolizumab study.  Accordingly we will likely leave the port in place at the time of surgery  She is already scheduled to see me again on 05/24/2018 for cycle #3.      Chistian Kasler, [name], MD  05/10/18 9:11 AM Medical Oncology and Hematology [facility] [address] [place], [place] [address] Tel. [phone]    Fax. [phone]    I, Soijett Blue am acting as scribe for Dr. Sarajane Jews C. Bakary Bramer.  I, Lurline Del MD, have reviewed the above documentation for accuracy and completeness, and I agree with the above.

## 2018-05-10 ENCOUNTER — Inpatient Hospital Stay: Payer: Medicare Other

## 2018-05-10 ENCOUNTER — Inpatient Hospital Stay: Payer: Medicare Other | Attending: Nurse Practitioner

## 2018-05-10 ENCOUNTER — Inpatient Hospital Stay (HOSPITAL_BASED_OUTPATIENT_CLINIC_OR_DEPARTMENT_OTHER): Payer: Medicare Other | Admitting: Oncology

## 2018-05-10 VITALS — BP 150/66 | HR 74 | Temp 98.1°F | Resp 18 | Ht 62.0 in | Wt 171.0 lb

## 2018-05-10 DIAGNOSIS — Z5189 Encounter for other specified aftercare: Secondary | ICD-10-CM | POA: Insufficient documentation

## 2018-05-10 DIAGNOSIS — C50411 Malignant neoplasm of upper-outer quadrant of right female breast: Secondary | ICD-10-CM | POA: Diagnosis not present

## 2018-05-10 DIAGNOSIS — R51 Headache: Secondary | ICD-10-CM | POA: Insufficient documentation

## 2018-05-10 DIAGNOSIS — Z171 Estrogen receptor negative status [ER-]: Secondary | ICD-10-CM | POA: Insufficient documentation

## 2018-05-10 DIAGNOSIS — R200 Anesthesia of skin: Secondary | ICD-10-CM | POA: Insufficient documentation

## 2018-05-10 DIAGNOSIS — Z5111 Encounter for antineoplastic chemotherapy: Secondary | ICD-10-CM | POA: Diagnosis not present

## 2018-05-10 LAB — COMPREHENSIVE METABOLIC PANEL
ALK PHOS: 89 U/L (ref 38–126)
ALT: 18 U/L (ref 0–44)
AST: 14 U/L — AB (ref 15–41)
Albumin: 3.9 g/dL (ref 3.5–5.0)
Anion gap: 9 (ref 5–15)
BILIRUBIN TOTAL: 0.4 mg/dL (ref 0.3–1.2)
BUN: 15 mg/dL (ref 8–23)
CALCIUM: 9.5 mg/dL (ref 8.9–10.3)
CO2: 25 mmol/L (ref 22–32)
CREATININE: 0.78 mg/dL (ref 0.44–1.00)
Chloride: 104 mmol/L (ref 98–111)
Glucose, Bld: 88 mg/dL (ref 70–99)
Potassium: 4.5 mmol/L (ref 3.5–5.1)
Sodium: 138 mmol/L (ref 135–145)
Total Protein: 7.2 g/dL (ref 6.5–8.1)

## 2018-05-10 LAB — CBC WITH DIFFERENTIAL/PLATELET
BASOS ABS: 0 10*3/uL (ref 0.0–0.1)
BASOS PCT: 2 %
EOS ABS: 0.1 10*3/uL (ref 0.0–0.5)
EOS PCT: 8 %
HCT: 33 % — ABNORMAL LOW (ref 34.8–46.6)
Hemoglobin: 11 g/dL — ABNORMAL LOW (ref 11.6–15.9)
Lymphocytes Relative: 34 %
Lymphs Abs: 0.4 10*3/uL — ABNORMAL LOW (ref 0.9–3.3)
MCH: 34.6 pg — ABNORMAL HIGH (ref 25.1–34.0)
MCHC: 33.2 g/dL (ref 31.5–36.0)
MCV: 104.1 fL — ABNORMAL HIGH (ref 79.5–101.0)
Monocytes Absolute: 0.2 10*3/uL (ref 0.1–0.9)
Monocytes Relative: 15 %
Neutro Abs: 0.5 10*3/uL — ABNORMAL LOW (ref 1.5–6.5)
Neutrophils Relative %: 41 %
Platelets: 89 10*3/uL — ABNORMAL LOW (ref 145–400)
RBC: 3.17 MIL/uL — AB (ref 3.70–5.45)
RDW: 15.1 % — ABNORMAL HIGH (ref 11.2–14.5)
WBC: 1.2 10*3/uL — AB (ref 3.9–10.3)

## 2018-05-10 MED ORDER — HEPARIN SOD (PORK) LOCK FLUSH 100 UNIT/ML IV SOLN
250.0000 [IU] | Freq: Once | INTRAVENOUS | Status: DC
  Filled 2018-05-10: qty 5

## 2018-05-10 MED ORDER — CIPROFLOXACIN HCL 500 MG PO TABS: 500.0000 mg | ORAL_TABLET | Freq: Two times a day (BID) | ORAL | 1 refills | Status: DC

## 2018-05-10 MED ORDER — SODIUM CHLORIDE 0.9% FLUSH
10.0000 mL | Freq: Once | INTRAVENOUS | Status: DC
  Filled 2018-05-10: qty 10

## 2018-05-11 ENCOUNTER — Telehealth: Payer: Self-pay | Admitting: Oncology

## 2018-05-11 NOTE — Telephone Encounter (Signed)
Per 8/5 no los °

## 2018-05-17 ENCOUNTER — Other Ambulatory Visit: Payer: Medicare Other

## 2018-05-17 ENCOUNTER — Ambulatory Visit: Payer: Medicare Other | Admitting: Adult Health

## 2018-05-17 ENCOUNTER — Ambulatory Visit: Payer: Medicare Other

## 2018-05-21 NOTE — Progress Notes (Signed)
Veronica Price  [phone] [phone]) [phone]     ID: Veronica Price DOB: 09/06/1946  MR#: [id number]  HDQ#:[id number]  Patient Care Team: [name], MD as PCP - General Ahava Kissoon, [name], MD as Consulting Physician (Oncology) Evett Kassa, [name], MD as Consulting Physician (Oncology) [name], MD as Consulting Physician (General Surgery) OTHER MD:  CHIEF COMPLAINT: Triple negative breast cancer  CURRENT TREATMENT: Neoadjuvant chemotherapy    INTERVAL HISTORY: Veronica Price returns today for follow up of her triple negative breast cancer, accompanied by her husband. She receives cyclophosphamide and doxorubicin every 21 days with today being day 1 cycle 3. She tolerates this well, she denies nausea, vomiting, or mouth sores.   She also receives Neulasta (OnPro)) support with chemotherapy. She also tolerates this well without any complications.    REVIEW OF SYSTEMS: Veronica Price reports that this morning, she had a shooting pain on the left side of her head.  This lasted a few minutes and disappeared.  It has not recurred.  She needs new glasses.. She stayed at home this past weekend to rest. There has been no unusual cough, phlegm production, or pleurisy. There has been no change in bowel or bladder habits. She denies unexplained fatigue or unexplained weight loss, dizziness, bleeding, rash, or fever. A detailed review of systems was otherwise stable.     HISTORY OF CURRENT ILLNESS: Veronica Price initially palpated a mass to her right breast in February 2019.   She brought her to her primary care physician's attention and was set up for bilateral diagnostic mammography with tomography and right breast ultrasonography on 11/25/2017 showing: a 3.2 x 3.7 cm lobulated mass in the UOQ of the right breast; there was a 3 cm calcified retroareolar mass in the left breast. On right breast ultrasonography there was a complex mostly solid 2.4 x 3.1 cm mass at 10  o'clock and multiple enlarged right axillary lymph nodes, the largest measuring 1.6 x 2.4.  Accordingly on 11/27/2017 the patient proceeded to biopsy of the right breast area in question, as well as an attempted axillary node biopsy. The pathology from this procedure showed ([id number]):  high grade infiltrating ductal carcinoma with no lymphovascular invasion.  The attempted axillary node biopsy showed fat, no nodal tissue.  Prognostic indicators were: estrogen and progesterone receptor negative,. Proliferation marker Ki67 at >80%. HER2 negative by immunohistochemistry at 0%  CT scans of the chest abdomen and pelvis completed on 12/10/2017 showed A complex solid and cystic 3.5 cm right breast mass with several bilateral axillary lymph nodes, the largest in the right axilla measured 12 mm. There was no evidence of visceral metastatic disease.   She had a bone scan on 12/10/2017 that showed two areas of increased uptake overlying the lateral aspects of the T11 vertebrae. Review of the CT scan of the chest revealed sclerosis of the T11 pedicles concerning for bone metastases.  However subsequent spinal MRI (I do not have that report) read this out as arthritis, not a metastatic deposit  The patient had a echocardiogram completed on 3/72019 with results showing: left ventricular ejection fraction of 55-60% with mild concentric hypertrophy. There was mild to moderate mitral and mild tricuspid regurgitation with mild pulmonary hypertension. The aortic valve was mildly thickened with trace aortic regurgitation.   The patient's subsequent history is as detailed below.  PAST MEDICAL HISTORY: Past Medical History:  Diagnosis Date  . Arthritis   . Breast cancer ([place])    Right breast  .  Cataract    "early" per patient  . GERD (gastroesophageal reflux disease)    occasionaly tums    PAST SURGICAL HISTORY: Past Surgical History:  Procedure Laterality Date  . ABDOMINAL HYSTERECTOMY     Total hysterectomy  in 2002  . PORTACATH PLACEMENT N/A 12/29/2017   Procedure: INSERTION PORT-A-CATH LEFT SUBCLAVIAN;  Surgeon: [name], MD;  Location: [facility];  Service: General;  Laterality: N/A;    FAMILY HISTORY Family History  Problem Relation Age of Onset  . Diabetes Father   . Heart attack Father    She notes that her father died from a possible MI in his early 85's Her mother is still alive and will be 36 March 2019!  The patient has 2 brothers and 2 sisters. Her brother had prostate cancer possibly agent orange related. She has a cousin who was recently diagnosed with stomach cancer. Her maternal grandmother died from colon cancer. She has a maternal uncle with bone cancer. She denies family hx of breast or ovarian cancer.    GYNECOLOGIC HISTORY:  Menarche: 72 years old Age at first live birth: 72 years old Veronica Price P3 LMP: at age 1 Contraceptive: OCP HRT   Hysterectomy? 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 She also worked at [employer]. She lives at home with her husband, Veronica Price who is a retired Building control surveyor, her husband works part time driving an adult day care bus. Her daughter, Veronica Price is a Multimedia programmer. Her daughter Veronica Price lives in [place], [place] and works as a Therapist, sports for the [place]. Her son Veronica Price, lives in [place], and works as a Administrator.  The patient has 3 grandchildren and no great-grandchildren. She is of baptist faith.    ADVANCED DIRECTIVES:    HEALTH MAINTENANCE: Social History   Tobacco Use  . Smoking status: Never Smoker  . Smokeless tobacco: Never Used  Substance Use Topics  . Alcohol use: Never    Frequency: Never  . Drug use: Never     Colonoscopy: UTD; last year (2018)  PAP:  Bone density:    Allergies  Allergen Reactions  . Prednisone Swelling and Other (See Comments)    " I think it was this that made my tongue swell "    Current  Outpatient Medications  Medication Sig Dispense Refill  . ciprofloxacin (CIPRO) 500 MG tablet Take 1 tablet (500 mg total) by mouth 2 (two) times daily. 10 tablet 1  . HYDROcodone-acetaminophen (NORCO) 5-325 MG tablet Take 1-2 tablets by mouth every 6 (six) hours as needed for moderate pain or severe pain. 20 tablet 0  . LORazepam (ATIVAN) 0.5 MG tablet Take 1 tablet (0.5 mg total) by mouth at bedtime as needed (Nausea or vomiting). 30 tablet 0  . naproxen sodium (ALEVE) 220 MG tablet Take 220 mg by mouth daily as needed (for pain).    . prochlorperazine (COMPAZINE) 10 MG tablet Take 10 mg by mouth every 6 (six) hours as needed for nausea or vomiting.     No current facility-administered medications for this visit.    Facility-Administered Medications Ordered in Other Visits  Medication Dose Route Frequency Provider Last Rate Last Dose  . heparin lock flush 100 unit/mL  250 Units Intracatheter Once Tiffny Gemmer, [name], MD      . sodium chloride flush (NS) 0.9 % injection 10 mL  10 mL Intracatheter Once Shon Indelicato, Sarajane Jews  C, MD        OBJECTIVE: Older African-American woman who appears stated age  72:   05/24/18 0819  BP: (!) 150/74  Pulse: 60  Resp: 18  Temp: 98 F (36.7 C)  SpO2: 100%     Body mass index is 31.68 kg/m.   Wt Readings from Last 3 Encounters:  05/24/18 173 lb 3.2 oz (78.6 kg)  05/10/18 171 lb (77.6 kg)  05/03/18 173 lb 3.2 oz (78.6 kg)  ECOG FS:1 - Symptomatic but completely ambulatory  Sclerae unicteric, pupils round and equal Oropharynx clear and moist No cervical or supraclavicular adenopathy Lungs no rales or rhonchi Heart regular rate and rhythm Abd soft, nontender, positive bowel sounds MSK no focal spinal tenderness, no upper extremity lymphedema Neuro: nonfocal, well oriented, appropriate affect Breasts: I can still feel the mass deep in the right breast, measuring approximately 1-1/2 cm.  There is no skin change associated with this.  The left breast  is benign.  Both axillae are benign.  LAB RESULTS:  CMP     Component Value Date/Time   NA 138 05/10/2018 0754   K 4.5 05/10/2018 0754   CL 104 05/10/2018 0754   CO2 25 05/10/2018 0754   GLUCOSE 88 05/10/2018 0754   BUN 15 05/10/2018 0754   CREATININE 0.78 05/10/2018 0754   CREATININE 0.80 01/11/2018 0747   CALCIUM 9.5 05/10/2018 0754   PROT 7.2 05/10/2018 0754   ALBUMIN 3.9 05/10/2018 0754   AST 14 (L) 05/10/2018 0754   AST 17 01/11/2018 0747   ALT 18 05/10/2018 0754   ALT 16 01/11/2018 0747   ALKPHOS 89 05/10/2018 0754   BILITOT 0.4 05/10/2018 0754   BILITOT 0.6 01/11/2018 0747   GFRNONAA >60 05/10/2018 0754   GFRNONAA >60 01/11/2018 0747   GFRAA >60 05/10/2018 0754   GFRAA >60 01/11/2018 0747    No results found for: TOTALPROTELP, ALBUMINELP, A1GS, A2GS, BETS, BETA2SER, GAMS, MSPIKE, SPEI  No results found for: KPAFRELGTCHN, LAMBDASER, KAPLAMBRATIO  Lab Results  Component Value Date   WBC 3.5 (L) 05/24/2018   NEUTROABS 2.1 05/24/2018   HGB 10.9 (L) 05/24/2018   HCT 31.8 (L) 05/24/2018   MCV 103.7 (H) 05/24/2018   PLT 375 05/24/2018    @LASTCHEMISTRY @  No results found for: LABCA2  No components found for: GOTLXB262  No results for input(s): INR in the last 168 hours.  No results found for: LABCA2  No results found for: MBT597  No results found for: CBU384  No results found for: TXM468  No results found for: CA2729  No components found for: HGQUANT  No results found for: CEA1 / No results found for: CEA1   No results found for: AFPTUMOR  No results found for: CHROMOGRNA  No results found for: PSA1  Appointment on 05/24/2018  Component Date Value Ref Range Status  . WBC 05/24/2018 3.5* 3.9 - 10.3 K/uL Final  . RBC 05/24/2018 3.07* 3.70 - 5.45 MIL/uL Final  . Hemoglobin 05/24/2018 10.9* 11.6 - 15.9 g/dL Final  . HCT 05/24/2018 31.8* 34.8 - 46.6 % Final  . MCV 05/24/2018 103.7* 79.5 - 101.0 fL Final  . MCH 05/24/2018 35.6* 25.1 - 34.0 pg  Final  . MCHC 05/24/2018 34.4  31.5 - 36.0 g/dL Final  . RDW 05/24/2018 15.3* 11.2 - 14.5 % Final  . Platelets 05/24/2018 375  145 - 400 K/uL Final  . Neutrophils Relative % 05/24/2018 59  % Final  . Neutro Abs 05/24/2018 2.1  1.5 -  6.5 K/uL Final  . Lymphocytes Relative 05/24/2018 16  % Final  . Lymphs Abs 05/24/2018 0.5* 0.9 - 3.3 K/uL Final  . Monocytes Relative 05/24/2018 24  % Final  . Monocytes Absolute 05/24/2018 0.8  0.1 - 0.9 K/uL Final  . Eosinophils Relative 05/24/2018 1  % Final  . Eosinophils Absolute 05/24/2018 0.0  0.0 - 0.5 K/uL Final  . Basophils Relative 05/24/2018 0  % Final  . Basophils Absolute 05/24/2018 0.0  0.0 - 0.1 K/uL Final   Performed at [facility], [place] [address], [place], [place] [address]    (this displays the last labs from the last 3 days)  No results found for: TOTALPROTELP, ALBUMINELP, A1GS, A2GS, BETS, BETA2SER, GAMS, MSPIKE, SPEI (this displays SPEP labs)  No results found for: KPAFRELGTCHN, LAMBDASER, KAPLAMBRATIO (kappa/lambda light chains)  No results found for: HGBA, HGBA2QUANT, HGBFQUANT, HGBSQUAN (Hemoglobinopathy evaluation)   No results found for: LDH  No results found for: IRON, TIBC, IRONPCTSAT (Iron and TIBC)  No results found for: FERRITIN  Urinalysis No results found for: COLORURINE, APPEARANCEUR, LABSPEC, PHURINE, GLUCOSEU, HGBUR, BILIRUBINUR, KETONESUR, PROTEINUR, UROBILINOGEN, NITRITE, LEUKOCYTESUR   STUDIES: No results found.  ELIGIBLE FOR AVAILABLE RESEARCH PROTOCOL: Consider SWOG [id number] post-op  ASSESSMENT: 72 y.o. [name], [place] woman status post right breast upper outer quadrant biopsy 11/25/2017 for a clinical T2 N2, stage IIIc invasive ductal carcinoma, grade 3, triple negative, with an MIB-1 of 80%.  (1) staging studies:  (a) chest CT 12/10/2017 scan showed no visceral metastatic disease  (b) bone scan 12/10/2017 showed uptake at T11, with sclerosis.  (c) CA-27-29 on 12/05/2017 was  19.0  (d) spinal MRI in [place] reportedly showed only arthrtis at T11  (2) neoadjuvant chemotherapy consisting of carboplatin/ paclitaxel x 12 starting 01/04/2018, discontinued 03/15/2018, followed by cyclophosphamide and doxorubicin given every 3 weeks x 4 starting 04/05/2018   (a) carboplatin/paclitaxel changed to carboplatin/gemcitabine after 9 cycles of Taxol/Carboplatin due to neuroapthy  (b) only able to tolerate 1 cycle of Gemcitabine/Carboplatin due to rash  (a) echocardiogram on 04/02/18 demonstrates a LVEF of 60-65%  (3) definitive surgery to follow  (4) adjuvant radiation as appropriate  (5) may be a candidate for the pembrolizumab study and therefore we are not removing the port at the time of definitive surgery   PLAN:   Melvia continues to tolerate her treatment remarkably well and she will proceed to cycle 3 of 4 planned of cyclophosphamide and doxorubicin.  She will see [place] in a week to check her counts and make sure she continues to do well and then she will have her final chemotherapy treatment 06/14/2018.  I set her up for a follow-up MRI of the breast on 06/17/2018.  She will be seeing surgery shortly thereafter to discuss definitive surgery.  She may well prove to be a candidate for the [place] study.  She has already been screened and if her residual mass in the breast is 1.0 cm or more she would be randomized to receive [place] versus observation.  For that reason we are not going to remove the port at the time of her surgery.  We did discuss all this in detail today  She knows to call for any other issues that may develop before her next visit.     Yamilette Garretson, [name], MD  05/24/18 8:31 AM Medical Oncology and Hematology [facility] [address] [place], [place] [address] Tel. [phone]    Fax. [phone]  Alice Rieger,  am acting as scribe for Chauncey Cruel MD.  I, Lurline Del MD, have reviewed the above documentation for  accuracy and completeness, and I agree with the above.

## 2018-05-24 ENCOUNTER — Inpatient Hospital Stay: Payer: Medicare Other

## 2018-05-24 ENCOUNTER — Inpatient Hospital Stay (HOSPITAL_BASED_OUTPATIENT_CLINIC_OR_DEPARTMENT_OTHER): Payer: Medicare Other | Admitting: Oncology

## 2018-05-24 ENCOUNTER — Ambulatory Visit: Payer: Medicare Other | Admitting: Oncology

## 2018-05-24 ENCOUNTER — Other Ambulatory Visit: Payer: Medicare Other

## 2018-05-24 VITALS — BP 150/74 | HR 60 | Temp 98.0°F | Resp 18 | Ht 62.0 in | Wt 173.2 lb

## 2018-05-24 DIAGNOSIS — C50411 Malignant neoplasm of upper-outer quadrant of right female breast: Secondary | ICD-10-CM | POA: Diagnosis not present

## 2018-05-24 DIAGNOSIS — Z171 Estrogen receptor negative status [ER-]: Secondary | ICD-10-CM

## 2018-05-24 DIAGNOSIS — Z5111 Encounter for antineoplastic chemotherapy: Secondary | ICD-10-CM | POA: Diagnosis not present

## 2018-05-24 DIAGNOSIS — R51 Headache: Secondary | ICD-10-CM | POA: Diagnosis not present

## 2018-05-24 DIAGNOSIS — R200 Anesthesia of skin: Secondary | ICD-10-CM | POA: Diagnosis not present

## 2018-05-24 DIAGNOSIS — Z95828 Presence of other vascular implants and grafts: Secondary | ICD-10-CM

## 2018-05-24 DIAGNOSIS — Z5189 Encounter for other specified aftercare: Secondary | ICD-10-CM | POA: Diagnosis not present

## 2018-05-24 LAB — CBC WITH DIFFERENTIAL/PLATELET
Basophils Absolute: 0 10*3/uL (ref 0.0–0.1)
Basophils Relative: 0 %
EOS PCT: 1 %
Eosinophils Absolute: 0 10*3/uL (ref 0.0–0.5)
HEMATOCRIT: 31.8 % — AB (ref 34.8–46.6)
Hemoglobin: 10.9 g/dL — ABNORMAL LOW (ref 11.6–15.9)
LYMPHS ABS: 0.5 10*3/uL — AB (ref 0.9–3.3)
Lymphocytes Relative: 16 %
MCH: 35.6 pg — AB (ref 25.1–34.0)
MCHC: 34.4 g/dL (ref 31.5–36.0)
MCV: 103.7 fL — AB (ref 79.5–101.0)
MONO ABS: 0.8 10*3/uL (ref 0.1–0.9)
Monocytes Relative: 24 %
NEUTROS ABS: 2.1 10*3/uL (ref 1.5–6.5)
Neutrophils Relative %: 59 %
PLATELETS: 375 10*3/uL (ref 145–400)
RBC: 3.07 MIL/uL — AB (ref 3.70–5.45)
RDW: 15.3 % — AB (ref 11.2–14.5)
WBC: 3.5 10*3/uL — ABNORMAL LOW (ref 3.9–10.3)

## 2018-05-24 LAB — COMPREHENSIVE METABOLIC PANEL
ALBUMIN: 3.9 g/dL (ref 3.5–5.0)
ALT: 20 U/L (ref 0–44)
AST: 21 U/L (ref 15–41)
Alkaline Phosphatase: 71 U/L (ref 38–126)
Anion gap: 9 (ref 5–15)
BUN: 13 mg/dL (ref 8–23)
CHLORIDE: 105 mmol/L (ref 98–111)
CO2: 26 mmol/L (ref 22–32)
Calcium: 8.9 mg/dL (ref 8.9–10.3)
Creatinine, Ser: 0.94 mg/dL (ref 0.44–1.00)
GFR calc Af Amer: >60 mL/min (ref >60)
GFR calc non Af Amer: 59 mL/min — ABNORMAL LOW (ref >60)
GLUCOSE: 85 mg/dL (ref 70–99)
POTASSIUM: 4.1 mmol/L (ref 3.5–5.1)
SODIUM: 140 mmol/L (ref 135–145)
Total Bilirubin: <0.2 mg/dL — ABNORMAL LOW (ref 0.3–1.2)
Total Protein: 7.2 g/dL (ref 6.5–8.1)

## 2018-05-24 MED ORDER — PALONOSETRON HCL INJECTION 0.25 MG/5ML
INTRAVENOUS | Status: AC
  Filled 2018-05-24: qty 5

## 2018-05-24 MED ORDER — SODIUM CHLORIDE 0.9 % IV SOLN
Freq: Once | INTRAVENOUS | Status: AC
  Administered 2018-05-24: 09:00:00 via INTRAVENOUS
  Filled 2018-05-24: qty 250

## 2018-05-24 MED ORDER — PEGFILGRASTIM 6 MG/0.6ML ~~LOC~~ PSKT
6.0000 mg | PREFILLED_SYRINGE | Freq: Once | SUBCUTANEOUS | Status: AC
  Administered 2018-05-24: 6 mg via SUBCUTANEOUS

## 2018-05-24 MED ORDER — PEGFILGRASTIM 6 MG/0.6ML ~~LOC~~ PSKT
PREFILLED_SYRINGE | SUBCUTANEOUS | Status: AC
  Filled 2018-05-24: qty 0.6

## 2018-05-24 MED ORDER — SODIUM CHLORIDE 0.9% FLUSH
10.0000 mL | INTRAVENOUS | Status: DC | PRN
  Administered 2018-05-24: 10 mL
  Filled 2018-05-24: qty 10

## 2018-05-24 MED ORDER — SODIUM CHLORIDE 0.9 % IV SOLN
600.0000 mg/m2 | Freq: Once | INTRAVENOUS | Status: AC
  Administered 2018-05-24: 1120 mg via INTRAVENOUS
  Filled 2018-05-24: qty 56

## 2018-05-24 MED ORDER — HEPARIN SOD (PORK) LOCK FLUSH 100 UNIT/ML IV SOLN
500.0000 [IU] | Freq: Once | INTRAVENOUS | Status: AC | PRN
  Administered 2018-05-24: 500 [IU]
  Filled 2018-05-24: qty 5

## 2018-05-24 MED ORDER — PALONOSETRON HCL INJECTION 0.25 MG/5ML
0.2500 mg | Freq: Once | INTRAVENOUS | Status: AC
  Administered 2018-05-24: 0.25 mg via INTRAVENOUS

## 2018-05-24 MED ORDER — DOXORUBICIN HCL CHEMO IV INJECTION 2 MG/ML
60.0000 mg/m2 | Freq: Once | INTRAVENOUS | Status: AC
  Administered 2018-05-24: 112 mg via INTRAVENOUS
  Filled 2018-05-24: qty 56

## 2018-05-24 MED ORDER — SODIUM CHLORIDE 0.9% FLUSH
10.0000 mL | Freq: Once | INTRAVENOUS | Status: AC
  Administered 2018-05-24: 10 mL
  Filled 2018-05-24: qty 10

## 2018-05-24 MED ORDER — SODIUM CHLORIDE 0.9 % IV SOLN
Freq: Once | INTRAVENOUS | Status: AC
  Administered 2018-05-24: 10:00:00 via INTRAVENOUS
  Filled 2018-05-24: qty 5

## 2018-05-24 NOTE — Patient Instructions (Signed)
[facility] Discharge Instructions for Patients Receiving Chemotherapy  Today you received the following chemotherapy agents Adriamycin, Cytoxan, neulasta.  To help prevent nausea and vomiting after your treatment, we encourage you to take your nausea medication as directed.  If you develop nausea and vomiting that is not controlled by your nausea medication, call the clinic.   BELOW ARE SYMPTOMS THAT SHOULD BE REPORTED IMMEDIATELY:  *FEVER GREATER THAN 100.5 F  *CHILLS WITH OR WITHOUT FEVER  NAUSEA AND VOMITING THAT IS NOT CONTROLLED WITH YOUR NAUSEA MEDICATION  *UNUSUAL SHORTNESS OF BREATH  *UNUSUAL BRUISING OR BLEEDING  TENDERNESS IN MOUTH AND THROAT WITH OR WITHOUT PRESENCE OF ULCERS  *URINARY PROBLEMS  *BOWEL PROBLEMS  UNUSUAL RASH Items with * indicate a potential emergency and should be followed up as soon as possible.  Feel free to call the clinic should you have any questions or concerns. The clinic phone number is [phone].  Please show the [facility] at check-in to the Emergency Department and triage nurse.

## 2018-05-29 ENCOUNTER — Other Ambulatory Visit: Payer: Self-pay | Admitting: Oncology

## 2018-05-29 DIAGNOSIS — C50411 Malignant neoplasm of upper-outer quadrant of right female breast: Secondary | ICD-10-CM

## 2018-05-29 DIAGNOSIS — Z171 Estrogen receptor negative status [ER-]: Principal | ICD-10-CM

## 2018-05-31 ENCOUNTER — Inpatient Hospital Stay: Payer: Medicare Other

## 2018-05-31 ENCOUNTER — Ambulatory Visit: Payer: Medicare Other | Admitting: Oncology

## 2018-05-31 DIAGNOSIS — Z5111 Encounter for antineoplastic chemotherapy: Secondary | ICD-10-CM | POA: Diagnosis not present

## 2018-05-31 DIAGNOSIS — C50411 Malignant neoplasm of upper-outer quadrant of right female breast: Secondary | ICD-10-CM

## 2018-05-31 DIAGNOSIS — Z171 Estrogen receptor negative status [ER-]: Secondary | ICD-10-CM | POA: Diagnosis not present

## 2018-05-31 DIAGNOSIS — R51 Headache: Secondary | ICD-10-CM | POA: Diagnosis not present

## 2018-05-31 DIAGNOSIS — Z5189 Encounter for other specified aftercare: Secondary | ICD-10-CM | POA: Diagnosis not present

## 2018-05-31 DIAGNOSIS — R200 Anesthesia of skin: Secondary | ICD-10-CM | POA: Diagnosis not present

## 2018-05-31 LAB — COMPREHENSIVE METABOLIC PANEL
ALT: 12 U/L (ref 0–44)
AST: 12 U/L — ABNORMAL LOW (ref 15–41)
Albumin: 4 g/dL (ref 3.5–5.0)
Alkaline Phosphatase: 85 U/L (ref 38–126)
Anion gap: 6 (ref 5–15)
BUN: 16 mg/dL (ref 8–23)
CHLORIDE: 105 mmol/L (ref 98–111)
CO2: 28 mmol/L (ref 22–32)
Calcium: 9.4 mg/dL (ref 8.9–10.3)
Creatinine, Ser: 0.76 mg/dL (ref 0.44–1.00)
Glucose, Bld: 91 mg/dL (ref 70–99)
POTASSIUM: 4.4 mmol/L (ref 3.5–5.1)
Sodium: 139 mmol/L (ref 135–145)
TOTAL PROTEIN: 7 g/dL (ref 6.5–8.1)
Total Bilirubin: 0.4 mg/dL (ref 0.3–1.2)

## 2018-05-31 LAB — CBC WITH DIFFERENTIAL/PLATELET
Basophils Absolute: 0 10*3/uL (ref 0.0–0.1)
Basophils Relative: 1 %
Eosinophils Absolute: 0.1 10*3/uL (ref 0.0–0.5)
Eosinophils Relative: 5 %
HEMATOCRIT: 34.2 % — AB (ref 34.8–46.6)
HEMOGLOBIN: 11.2 g/dL — AB (ref 11.6–15.9)
LYMPHS ABS: 0.5 10*3/uL — AB (ref 0.9–3.3)
LYMPHS PCT: 41 %
MCH: 33.9 pg (ref 25.1–34.0)
MCHC: 32.7 g/dL (ref 31.5–36.0)
MCV: 103.6 fL — AB (ref 79.5–101.0)
Monocytes Absolute: 0.2 10*3/uL (ref 0.1–0.9)
Monocytes Relative: 15 %
NEUTROS ABS: 0.5 10*3/uL — AB (ref 1.5–6.5)
NEUTROS PCT: 38 %
Platelets: 111 10*3/uL — ABNORMAL LOW (ref 145–400)
RBC: 3.3 MIL/uL — AB (ref 3.70–5.45)
RDW: 14.3 % (ref 11.2–14.5)
WBC: 1.3 10*3/uL — AB (ref 3.9–10.3)

## 2018-06-14 ENCOUNTER — Inpatient Hospital Stay: Payer: Medicare Other

## 2018-06-14 ENCOUNTER — Inpatient Hospital Stay: Payer: Medicare Other | Attending: Nurse Practitioner

## 2018-06-14 VITALS — BP 133/76 | HR 69 | Temp 98.2°F | Resp 18

## 2018-06-14 DIAGNOSIS — Z79899 Other long term (current) drug therapy: Secondary | ICD-10-CM | POA: Insufficient documentation

## 2018-06-14 DIAGNOSIS — Z5189 Encounter for other specified aftercare: Secondary | ICD-10-CM | POA: Diagnosis not present

## 2018-06-14 DIAGNOSIS — Z171 Estrogen receptor negative status [ER-]: Secondary | ICD-10-CM | POA: Insufficient documentation

## 2018-06-14 DIAGNOSIS — Z5111 Encounter for antineoplastic chemotherapy: Secondary | ICD-10-CM | POA: Diagnosis not present

## 2018-06-14 DIAGNOSIS — Z95828 Presence of other vascular implants and grafts: Secondary | ICD-10-CM

## 2018-06-14 DIAGNOSIS — C50411 Malignant neoplasm of upper-outer quadrant of right female breast: Secondary | ICD-10-CM

## 2018-06-14 LAB — CBC WITH DIFFERENTIAL/PLATELET
BASOS ABS: 0 10*3/uL (ref 0.0–0.1)
Basophils Relative: 1 %
Eosinophils Absolute: 0.1 10*3/uL (ref 0.0–0.5)
Eosinophils Relative: 1 %
HCT: 33 % — ABNORMAL LOW (ref 34.8–46.6)
HEMOGLOBIN: 10.6 g/dL — AB (ref 11.6–15.9)
Lymphocytes Relative: 13 %
Lymphs Abs: 0.5 10*3/uL — ABNORMAL LOW (ref 0.9–3.3)
MCH: 33.2 pg (ref 25.1–34.0)
MCHC: 32.1 g/dL (ref 31.5–36.0)
MCV: 103.4 fL — ABNORMAL HIGH (ref 79.5–101.0)
Monocytes Absolute: 1 10*3/uL — ABNORMAL HIGH (ref 0.1–0.9)
Monocytes Relative: 25 %
NEUTROS ABS: 2.5 10*3/uL (ref 1.5–6.5)
NEUTROS PCT: 60 %
PLATELETS: 304 10*3/uL (ref 145–400)
RBC: 3.19 MIL/uL — AB (ref 3.70–5.45)
RDW: 15.4 % — AB (ref 11.2–14.5)
WBC: 4.1 10*3/uL (ref 3.9–10.3)

## 2018-06-14 LAB — COMPREHENSIVE METABOLIC PANEL
ALT: 16 U/L (ref 0–44)
ANION GAP: 9 (ref 5–15)
AST: 19 U/L (ref 15–41)
Albumin: 3.8 g/dL (ref 3.5–5.0)
Alkaline Phosphatase: 69 U/L (ref 38–126)
BILIRUBIN TOTAL: 0.3 mg/dL (ref 0.3–1.2)
BUN: 13 mg/dL (ref 8–23)
CO2: 26 mmol/L (ref 22–32)
Calcium: 9.2 mg/dL (ref 8.9–10.3)
Chloride: 109 mmol/L (ref 98–111)
Creatinine, Ser: 0.8 mg/dL (ref 0.44–1.00)
Glucose, Bld: 104 mg/dL — ABNORMAL HIGH (ref 70–99)
POTASSIUM: 4 mmol/L (ref 3.5–5.1)
Sodium: 144 mmol/L (ref 135–145)
TOTAL PROTEIN: 6.7 g/dL (ref 6.5–8.1)

## 2018-06-14 MED ORDER — PALONOSETRON HCL INJECTION 0.25 MG/5ML
INTRAVENOUS | Status: AC
  Filled 2018-06-14: qty 5

## 2018-06-14 MED ORDER — PEGFILGRASTIM 6 MG/0.6ML ~~LOC~~ PSKT
PREFILLED_SYRINGE | SUBCUTANEOUS | Status: AC
  Filled 2018-06-14: qty 0.6

## 2018-06-14 MED ORDER — SODIUM CHLORIDE 0.9 % IV SOLN
Freq: Once | INTRAVENOUS | Status: AC
  Administered 2018-06-14: 10:00:00 via INTRAVENOUS
  Filled 2018-06-14: qty 5

## 2018-06-14 MED ORDER — SODIUM CHLORIDE 0.9 % IV SOLN
Freq: Once | INTRAVENOUS | Status: AC
  Administered 2018-06-14: 10:00:00 via INTRAVENOUS
  Filled 2018-06-14: qty 250

## 2018-06-14 MED ORDER — HEPARIN SOD (PORK) LOCK FLUSH 100 UNIT/ML IV SOLN
500.0000 [IU] | Freq: Once | INTRAVENOUS | Status: AC | PRN
  Administered 2018-06-14: 500 [IU]
  Filled 2018-06-14: qty 5

## 2018-06-14 MED ORDER — DOXORUBICIN HCL CHEMO IV INJECTION 2 MG/ML
60.0000 mg/m2 | Freq: Once | INTRAVENOUS | Status: AC
  Administered 2018-06-14: 112 mg via INTRAVENOUS
  Filled 2018-06-14: qty 56

## 2018-06-14 MED ORDER — PALONOSETRON HCL INJECTION 0.25 MG/5ML
0.2500 mg | Freq: Once | INTRAVENOUS | Status: AC
  Administered 2018-06-14: 0.25 mg via INTRAVENOUS

## 2018-06-14 MED ORDER — PEGFILGRASTIM 6 MG/0.6ML ~~LOC~~ PSKT
6.0000 mg | PREFILLED_SYRINGE | Freq: Once | SUBCUTANEOUS | Status: AC
  Administered 2018-06-14: 6 mg via SUBCUTANEOUS

## 2018-06-14 MED ORDER — SODIUM CHLORIDE 0.9% FLUSH
10.0000 mL | INTRAVENOUS | Status: DC | PRN
  Administered 2018-06-14: 10 mL
  Filled 2018-06-14: qty 10

## 2018-06-14 MED ORDER — SODIUM CHLORIDE 0.9% FLUSH
10.0000 mL | Freq: Once | INTRAVENOUS | Status: AC
  Administered 2018-06-14: 10 mL
  Filled 2018-06-14: qty 10

## 2018-06-14 MED ORDER — SODIUM CHLORIDE 0.9 % IV SOLN
600.0000 mg/m2 | Freq: Once | INTRAVENOUS | Status: AC
  Administered 2018-06-14: 1120 mg via INTRAVENOUS
  Filled 2018-06-14: qty 56

## 2018-06-14 NOTE — Patient Instructions (Signed)
[facility] Discharge Instructions for Patients Receiving Chemotherapy  Today you received the following chemotherapy agents Adriamycin, Cytoxan, neulasta.  To help prevent nausea and vomiting after your treatment, we encourage you to take your nausea medication as directed.  If you develop nausea and vomiting that is not controlled by your nausea medication, call the clinic.   BELOW ARE SYMPTOMS THAT SHOULD BE REPORTED IMMEDIATELY:  *FEVER GREATER THAN 100.5 F  *CHILLS WITH OR WITHOUT FEVER  NAUSEA AND VOMITING THAT IS NOT CONTROLLED WITH YOUR NAUSEA MEDICATION  *UNUSUAL SHORTNESS OF BREATH  *UNUSUAL BRUISING OR BLEEDING  TENDERNESS IN MOUTH AND THROAT WITH OR WITHOUT PRESENCE OF ULCERS  *URINARY PROBLEMS  *BOWEL PROBLEMS  UNUSUAL RASH Items with * indicate a potential emergency and should be followed up as soon as possible.  Feel free to call the clinic should you have any questions or concerns. The clinic phone number is [phone].  Please show the [facility] at check-in to the Emergency Department and triage nurse.

## 2018-06-15 ENCOUNTER — Ambulatory Visit
Admission: RE | Admit: 2018-06-15 | Discharge: 2018-06-15 | Disposition: A | Discharge status: Home / Self Care | Payer: Medicare Other | Source: Ambulatory Visit | Attending: Oncology | Admitting: Oncology

## 2018-06-15 DIAGNOSIS — Z171 Estrogen receptor negative status [ER-]: Principal | ICD-10-CM

## 2018-06-15 DIAGNOSIS — C50411 Malignant neoplasm of upper-outer quadrant of right female breast: Secondary | ICD-10-CM

## 2018-06-16 ENCOUNTER — Ambulatory Visit
Admission: RE | Admit: 2018-06-16 | Discharge: 2018-06-16 | Disposition: A | Discharge status: Home / Self Care | Payer: Medicare Other | Source: Ambulatory Visit | Attending: Oncology | Admitting: Oncology

## 2018-06-16 DIAGNOSIS — Z5111 Encounter for antineoplastic chemotherapy: Secondary | ICD-10-CM | POA: Diagnosis not present

## 2018-06-16 DIAGNOSIS — C50911 Malignant neoplasm of unspecified site of right female breast: Secondary | ICD-10-CM | POA: Diagnosis not present

## 2018-06-16 MED ORDER — GADOBENATE DIMEGLUMINE 529 MG/ML IV SOLN
15.0000 mL | Freq: Once | INTRAVENOUS | Status: AC | PRN
  Administered 2018-06-16: 15 mL via INTRAVENOUS

## 2018-06-21 ENCOUNTER — Inpatient Hospital Stay: Payer: Medicare Other

## 2018-06-21 ENCOUNTER — Encounter: Payer: Self-pay | Admitting: Adult Health

## 2018-06-21 ENCOUNTER — Inpatient Hospital Stay (HOSPITAL_BASED_OUTPATIENT_CLINIC_OR_DEPARTMENT_OTHER): Payer: Medicare Other | Admitting: Adult Health

## 2018-06-21 ENCOUNTER — Telehealth: Payer: Self-pay | Admitting: Adult Health

## 2018-06-21 VITALS — BP 138/57 | HR 79 | Temp 97.9°F | Resp 18 | Ht 62.0 in | Wt 167.6 lb

## 2018-06-21 DIAGNOSIS — Z171 Estrogen receptor negative status [ER-]: Secondary | ICD-10-CM

## 2018-06-21 DIAGNOSIS — Z5189 Encounter for other specified aftercare: Secondary | ICD-10-CM | POA: Diagnosis not present

## 2018-06-21 DIAGNOSIS — Z5111 Encounter for antineoplastic chemotherapy: Secondary | ICD-10-CM | POA: Diagnosis not present

## 2018-06-21 DIAGNOSIS — C50411 Malignant neoplasm of upper-outer quadrant of right female breast: Secondary | ICD-10-CM

## 2018-06-21 DIAGNOSIS — Z95828 Presence of other vascular implants and grafts: Secondary | ICD-10-CM

## 2018-06-21 DIAGNOSIS — Z79899 Other long term (current) drug therapy: Secondary | ICD-10-CM | POA: Diagnosis not present

## 2018-06-21 LAB — COMPREHENSIVE METABOLIC PANEL
ALT: 10 U/L (ref 0–44)
ANION GAP: 8 (ref 5–15)
AST: 11 U/L — ABNORMAL LOW (ref 15–41)
Albumin: 3.7 g/dL (ref 3.5–5.0)
Alkaline Phosphatase: 82 U/L (ref 38–126)
BUN: 17 mg/dL (ref 8–23)
CHLORIDE: 106 mmol/L (ref 98–111)
CO2: 28 mmol/L (ref 22–32)
CREATININE: 0.82 mg/dL (ref 0.44–1.00)
Calcium: 9.4 mg/dL (ref 8.9–10.3)
GFR calc Af Amer: >60 mL/min (ref >60)
Glucose, Bld: 85 mg/dL (ref 70–99)
Potassium: 4.3 mmol/L (ref 3.5–5.1)
SODIUM: 142 mmol/L (ref 135–145)
Total Bilirubin: 0.7 mg/dL (ref 0.3–1.2)
Total Protein: 6.6 g/dL (ref 6.5–8.1)

## 2018-06-21 LAB — CBC WITH DIFFERENTIAL/PLATELET
Basophils Absolute: 0 10*3/uL (ref 0.0–0.1)
Basophils Relative: 3 %
EOS PCT: 4 %
Eosinophils Absolute: 0 10*3/uL (ref 0.0–0.5)
HCT: 32.5 % — ABNORMAL LOW (ref 34.8–46.6)
Hemoglobin: 10.7 g/dL — ABNORMAL LOW (ref 11.6–15.9)
LYMPHS ABS: 0.4 10*3/uL — AB (ref 0.9–3.3)
Lymphocytes Relative: 42 %
MCH: 33.4 pg (ref 25.1–34.0)
MCHC: 32.9 g/dL (ref 31.5–36.0)
MCV: 101.6 fL — AB (ref 79.5–101.0)
Monocytes Absolute: 0.1 10*3/uL (ref 0.1–0.9)
Monocytes Relative: 12 %
NEUTROS ABS: 0.4 10*3/uL — AB (ref 1.5–6.5)
Neutrophils Relative %: 39 %
PLATELETS: 80 10*3/uL — AB (ref 145–400)
RBC: 3.2 MIL/uL — ABNORMAL LOW (ref 3.70–5.45)
RDW: 14.6 % — AB (ref 11.2–14.5)
WBC: 0.9 10*3/uL — AB (ref 3.9–10.3)

## 2018-06-21 MED ORDER — CIPROFLOXACIN HCL 500 MG PO TABS: 500.0000 mg | ORAL_TABLET | Freq: Two times a day (BID) | ORAL | 1 refills | Status: DC

## 2018-06-21 MED ORDER — SODIUM CHLORIDE 0.9% FLUSH
10.0000 mL | Freq: Once | INTRAVENOUS | Status: AC
  Administered 2018-06-21: 10 mL
  Filled 2018-06-21: qty 10

## 2018-06-21 MED ORDER — HEPARIN SOD (PORK) LOCK FLUSH 100 UNIT/ML IV SOLN
500.0000 [IU] | Freq: Once | INTRAVENOUS | Status: AC
  Administered 2018-06-21: 500 [IU]
  Filled 2018-06-21: qty 5

## 2018-06-21 NOTE — Telephone Encounter (Signed)
Per 9/16  Schedule with Mendel Ryder Dr Jana Hakim had a full October. Gave avs and calendar

## 2018-06-21 NOTE — Progress Notes (Signed)
[place]  Telephone:(336) [phone] Fax:(336) [phone]     ID: RONNY RUDDELL DOB: [birth year]  MR#: [id number]  TDV#:[id number]  Patient Care Team: [name], MD as PCP - General [name], [name], MD as Consulting Physician (Oncology) [name], [name], MD as Consulting Physician (Oncology) [name], MD as Consulting Physician (General Surgery) OTHER MD:  CHIEF COMPLAINT: Triple negative breast cancer  CURRENT TREATMENT: Neoadjuvant chemotherapy    INTERVAL HISTORY: Jelani returns today for follow up of her triple negative breast cancer, accompanied by her daughter Jocelyn Lamer. She receives cyclophosphamide and doxorubicin every 21 days with today being day 8 cycle 4. She is doing well today.    Since her last visit she underwent MRI of the breasts which revealed continued enhancement in the right breast, but smaller nodule, that is down to 1.7 x 2.1 x 1.5 cm, from 3.3 x 2.6 x 2.9 cm originally.   She has an appointment on Wednesday, 06/23/2018 with Dr. Jana Hakim for surgical planning.     REVIEW OF SYSTEMS: Ercilia is doing moderately well today.  She is fatigued.  She is doing well today.  She denies any fevers or chills.  She is without any unusual headaches, vision changes, mucositis.  She doesn't have any chest pain, palpitations, shortness of breath or cough.  She isn't experiencing nausea, vomiting, constipation, or diarrhea.  A detailed ROS was otherwise non contributory.      HISTORY OF CURRENT ILLNESS: ANDILYN BETTCHER initially palpated a mass to her right breast in February 2019.   She brought her to her primary care physician's attention and was set up for bilateral diagnostic mammography with tomography and right breast ultrasonography on 11/25/2017 showing: a 3.2 x 3.7 cm lobulated mass in the UOQ of the right breast; there was a 3 cm calcified retroareolar mass in the left breast. On right breast ultrasonography there was a complex mostly  solid 2.4 x 3.1 cm mass at 10 o'clock and multiple enlarged right axillary lymph nodes, the largest measuring 1.6 x 2.4.  Accordingly on 11/27/2017 the patient proceeded to biopsy of the right breast area in question, as well as an attempted axillary node biopsy. The pathology from this procedure showed ([id number]):  high grade infiltrating ductal carcinoma with no lymphovascular invasion.  The attempted axillary node biopsy showed fat, no nodal tissue.  Prognostic indicators were: estrogen and progesterone receptor negative,. Proliferation marker Ki67 at >80%. HER2 negative by immunohistochemistry at 0%  CT scans of the chest abdomen and pelvis completed on 12/10/2017 showed A complex solid and cystic 3.5 cm right breast mass with several bilateral axillary lymph nodes, the largest in the right axilla measured 12 mm. There was no evidence of visceral metastatic disease.   She had a bone scan on 12/10/2017 that showed two areas of increased uptake overlying the lateral aspects of the T11 vertebrae. Review of the CT scan of the chest revealed sclerosis of the T11 pedicles concerning for bone metastases.  However subsequent spinal MRI (I do not have that report) read this out as arthritis, not a metastatic deposit  The patient had a echocardiogram completed on 3/72019 with results showing: left ventricular ejection fraction of 55-60% with mild concentric hypertrophy. There was mild to moderate mitral and mild tricuspid regurgitation with mild pulmonary hypertension. The aortic valve was mildly thickened with trace aortic regurgitation.   The patient's subsequent history is as detailed below.  PAST MEDICAL HISTORY: Past Medical History:  Diagnosis Date  .  Arthritis   . Breast cancer ([place])    Right breast  . Cataract    "early" per patient  . GERD (gastroesophageal reflux disease)    occasionaly tums    PAST SURGICAL HISTORY: Past Surgical History:  Procedure Laterality Date  . ABDOMINAL  HYSTERECTOMY     Total hysterectomy in 2002  . PORTACATH PLACEMENT N/A 12/29/2017   Procedure: INSERTION PORT-A-CATH LEFT SUBCLAVIAN;  Surgeon: [name], MD;  Location: [facility];  Service: General;  Laterality: N/A;    FAMILY HISTORY Family History  Problem Relation Age of Onset  . Diabetes Father   . Heart attack Father    She notes that her father died from a possible MI in his early 49's Her mother is still alive and will be 69 March 2019!  The patient has 2 brothers and 2 sisters. Her brother had prostate cancer possibly agent orange related. She has a cousin who was recently diagnosed with stomach cancer. Her maternal grandmother died from colon cancer. She has a maternal uncle with bone cancer. She denies family hx of breast or ovarian cancer.    GYNECOLOGIC HISTORY:  Menarche: 71 years old Age at first live birth: 72 years old [place] P3 LMP: at age 44 Contraceptive: OCP HRT   Hysterectomy? 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 She also worked at [employer]. She lives at home with her husband, Gwyndolyn Saxon who is a retired Building control surveyor, her husband works part time driving an adult day care bus. Her daughter, [name] is a Multimedia programmer. Her daughter [name] lives in [place], [place] and works as a Therapist, sports for the [place]. Her son Solaris Kram, lives in [place], and works as a Administrator.  The patient has 3 grandchildren and no great-grandchildren. She is of baptist faith.    ADVANCED DIRECTIVES:    HEALTH MAINTENANCE: Social History   Tobacco Use  . Smoking status: Never Smoker  . Smokeless tobacco: Never Used  Substance Use Topics  . Alcohol use: Never    Frequency: Never  . Drug use: Never     Colonoscopy: UTD; last year (2018)  PAP:  Bone density:    Allergies  Allergen Reactions  . Prednisone Swelling and Other (See Comments)    " I think it was this that made my  tongue swell "    Current Outpatient Medications  Medication Sig Dispense Refill  . HYDROcodone-acetaminophen (NORCO) 5-325 MG tablet Take 1-2 tablets by mouth every 6 (six) hours as needed for moderate pain or severe pain. 20 tablet 0  . LORazepam (ATIVAN) 0.5 MG tablet Take 1 tablet (0.5 mg total) by mouth at bedtime as needed (Nausea or vomiting). 30 tablet 0  . naproxen sodium (ALEVE) 220 MG tablet Take 220 mg by mouth daily as needed (for pain).    . prochlorperazine (COMPAZINE) 10 MG tablet Take 10 mg by mouth every 6 (six) hours as needed for nausea or vomiting.     No current facility-administered medications for this visit.    Facility-Administered Medications Ordered in Other Visits  Medication Dose Route Frequency Provider Last Rate Last Dose  . heparin lock flush 100 unit/mL  250 Units Intracatheter Once [name], [name], MD      . sodium chloride flush (NS) 0.9 % injection 10 mL  10 mL Intracatheter Once [name], [name], MD  OBJECTIVE:   Vitals:   06/21/18 0858  BP: (!) 138/57  Pulse: 79  Resp: 18  Temp: 97.9 F (36.6 C)  SpO2: 100%     Body mass index is 30.65 kg/m.   Wt Readings from Last 3 Encounters:  06/21/18 167 lb 9.6 oz (76 kg)  05/24/18 173 lb 3.2 oz (78.6 kg)  05/10/18 171 lb (77.6 kg)  ECOG FS:1 - Symptomatic but completely ambulatory  GENERAL: Patient is a well appearing female in no acute distress HEENT:  Sclerae anicteric.  Oropharynx clear and moist. No ulcerations or evidence of oropharyngeal candidiasis. Neck is supple.  NODES:  No cervical, supraclavicular, or axillary lymphadenopathy palpated.  BREAST EXAM: right breast with small 1-2 cm mass in upper outer quadrant, left breast without nodules, masses, skin or nipple changes. LUNGS:  Clear to auscultation bilaterally.  No wheezes or rhonchi. HEART:  Regular rate and rhythm. No murmur appreciated. ABDOMEN:  Soft, nontender.  Positive, normoactive bowel sounds. No organomegaly  palpated. MSK:  No focal spinal tenderness to palpation. Full range of motion bilaterally in the upper extremities. EXTREMITIES:  No peripheral edema.   SKIN:  Clear with no obvious rashes or skin changes. No nail dyscrasia. NEURO:  Nonfocal. Well oriented.  Appropriate affect.    LAB RESULTS:  CMP     Component Value Date/Time   NA 144 06/14/2018 0845   K 4.0 06/14/2018 0845   CL 109 06/14/2018 0845   CO2 26 06/14/2018 0845   GLUCOSE 104 (H) 06/14/2018 0845   BUN 13 06/14/2018 0845   CREATININE 0.80 06/14/2018 0845   CREATININE 0.80 01/11/2018 0747   CALCIUM 9.2 06/14/2018 0845   PROT 6.7 06/14/2018 0845   ALBUMIN 3.8 06/14/2018 0845   AST 19 06/14/2018 0845   AST 17 01/11/2018 0747   ALT 16 06/14/2018 0845   ALT 16 01/11/2018 0747   ALKPHOS 69 06/14/2018 0845   BILITOT 0.3 06/14/2018 0845   BILITOT 0.6 01/11/2018 0747   GFRNONAA >60 06/14/2018 0845   GFRNONAA >60 01/11/2018 0747   GFRAA >60 06/14/2018 0845   GFRAA >60 01/11/2018 0747    No results found for: TOTALPROTELP, ALBUMINELP, A1GS, A2GS, BETS, BETA2SER, GAMS, MSPIKE, SPEI  No results found for: KPAFRELGTCHN, LAMBDASER, KAPLAMBRATIO  Lab Results  Component Value Date   WBC 0.9 (LL) 06/21/2018   NEUTROABS 0.4 (LL) 06/21/2018   HGB 10.7 (L) 06/21/2018   HCT 32.5 (L) 06/21/2018   MCV 101.6 (H) 06/21/2018   PLT 80 (L) 06/21/2018    @LASTCHEMISTRY @  No results found for: LABCA2  No components found for: [id number]  No results for input(s): INR in the last 168 hours.  No results found for: LABCA2  No results found for: SWH675  No results found for: FFM384  No results found for: YKZ993  No results found for: CA2729  No components found for: HGQUANT  No results found for: CEA1 / No results found for: CEA1   No results found for: AFPTUMOR  No results found for: CHROMOGRNA  No results found for: PSA1  Appointment on 06/21/2018  Component Date Value Ref Range Status  . WBC 06/21/2018 0.9*  3.9 - 10.3 K/uL Final   CRITICAL RESULT CALLED TO, READ BACK BY AND VERIFIED WITH: Marlon Pel, RN [phone]   . RBC 06/21/2018 3.20* 3.70 - 5.45 MIL/uL Final  . Hemoglobin 06/21/2018 10.7* 11.6 - 15.9 g/dL Final  . HCT 06/21/2018 32.5* 34.8 - 46.6 % Final  . MCV 06/21/2018 101.6* 79.5 -  101.0 fL Final  . MCH 06/21/2018 33.4  25.1 - 34.0 pg Final  . MCHC 06/21/2018 32.9  31.5 - 36.0 g/dL Final  . RDW 06/21/2018 14.6* 11.2 - 14.5 % Final  . Platelets 06/21/2018 80* 145 - 400 K/uL Final  . Neutrophils Relative % 06/21/2018 39  % Final  . Neutro Abs 06/21/2018 0.4* 1.5 - 6.5 K/uL Final   CRITICAL RESULT CALLED TO, READ BACK BY AND VERIFIED WITH: Marlon Pel, RN [phone]   . Lymphocytes Relative 06/21/2018 42  % Final  . Lymphs Abs 06/21/2018 0.4* 0.9 - 3.3 K/uL Final  . Monocytes Relative 06/21/2018 12  % Final  . Monocytes Absolute 06/21/2018 0.1  0.1 - 0.9 K/uL Final  . Eosinophils Relative 06/21/2018 4  % Final  . Eosinophils Absolute 06/21/2018 0.0  0.0 - 0.5 K/uL Final  . Basophils Relative 06/21/2018 3  % Final  . Basophils Absolute 06/21/2018 0.0  0.0 - 0.1 K/uL Final   Performed at [facility], [place] [address] [place], [place] [address]    (this displays the last labs from the last 3 days)  No results found for: TOTALPROTELP, ALBUMINELP, A1GS, A2GS, BETS, BETA2SER, GAMS, MSPIKE, SPEI (this displays SPEP labs)  No results found for: KPAFRELGTCHN, LAMBDASER, KAPLAMBRATIO (kappa/lambda light chains)  No results found for: HGBA, HGBA2QUANT, HGBFQUANT, HGBSQUAN (Hemoglobinopathy evaluation)   No results found for: LDH  No results found for: IRON, TIBC, IRONPCTSAT (Iron and TIBC)  No results found for: FERRITIN  Urinalysis No results found for: COLORURINE, APPEARANCEUR, LABSPEC, PHURINE, GLUCOSEU, HGBUR, BILIRUBINUR, KETONESUR, PROTEINUR, UROBILINOGEN, NITRITE, LEUKOCYTESUR   STUDIES: Mr Breast Bilateral W [place] Cad  Result Date: 06/16/2018 CLINICAL  DATA:  72 year old female for follow-up of RIGHT breast cancer, undergoing neoadjuvant therapy. LABS:  None performed today EXAM: BILATERAL BREAST MRI WITH AND WITHOUT CONTRAST TECHNIQUE: Multiplanar, multisequence MR images of both breasts were obtained prior to and following the intravenous administration of 15 ml of MultiHance. Three-dimensional MR images were rendered by post-processing the original MR data using the DynaCAD thin client. The 3D MR images are interpreted and the findings are included in the complete MRI report below. COMPARISON:  12/26/2017 MR and 11/25/2017 outside mammogram [facility], [place]) FINDINGS: Breast composition: b. Scattered fibroglandular tissue. Background parenchymal enhancement: Mild Right breast: A 1.7 x 2.1 x 1.5 cm enhancing mass within the posterior UPPER-OUTER RIGHT breast previously measured 3.3 x 2.6 x 2.9 cm. No other suspicious mass or enhancement noted within the RIGHT breast. Left breast: No mass or abnormal enhancement. Lymph nodes: No abnormal appearing lymph nodes. Ancillary findings:  None. IMPRESSION: 1. Treatment response with decreasing size of known RIGHT breast malignancy now measuring 1.7 x 2.1 x 1.5 cm, previously 3.3 x 2.6 x 2.9 cm. 2. No other significant abnormalities noted. RECOMMENDATION: Treatment plan BI-RADS CATEGORY  6: Known biopsy-proven malignancy. Electronically Signed   By: Margarette Canada M.D.   On: 06/16/2018 13:48    ELIGIBLE FOR AVAILABLE RESEARCH PROTOCOL: Consider SWOG [id number] post-op  ASSESSMENT: 72 y.o. [name], [place] woman status post right breast upper outer quadrant biopsy 11/25/2017 for a clinical T2 N2, stage IIIc invasive ductal carcinoma, grade 3, triple negative, with an MIB-1 of 80%.  (1) staging studies:  (a) chest CT 12/10/2017 scan showed no visceral metastatic disease  (b) bone scan 12/10/2017 showed uptake at T11, with sclerosis.  (c) CA-27-29 on 12/05/2017 was 19.0  (d) spinal MRI in [place] reportedly showed only  arthrtis at T11  (  2) neoadjuvant chemotherapy consisting of carboplatin/ paclitaxel x 12 starting 01/04/2018, discontinued 03/15/2018, followed by cyclophosphamide and doxorubicin given every 3 weeks x 4 starting 04/05/2018   (a) carboplatin/paclitaxel changed to carboplatin/gemcitabine after 9 cycles of Taxol/Carboplatin due to neuroapthy  (b) only able to tolerate 1 cycle of Gemcitabine/Carboplatin due to rash  (a) echocardiogram on 04/02/18 demonstrates a LVEF of 60-65%  (3) definitive surgery to follow  (4) adjuvant radiation as appropriate  (5) may be a candidate for the pembrolizumab study and therefore we are not removing the port at the time of definitive surgery   PLAN:   Eimi is doing well today.  She has completed her neoadjuvant chemotherapy.  She is neutropenic.  I reviewed this and neutropenic precautions with her in detail. I sent in Cipro to her pharmacy for her to take BID.  She has tolerated this well in the past.  I reviewed her MRI with her today which does indicate residual disease.  We reviewed that she will keep her port when she does undergo surgery, that way she can be enrolled in the SWOG S1418 study if she is eligible following surgery and radiation.  We reviewed that we will need her final surgical pathology and she will need to sit down with research to make those final determinations.    Quamesha will meet with Dr. Dalbert Batman on Wednesday, 06/23/2018 for follow up.  I set her up to see Dr. Jana Hakim in about 5 weeks to review her pathology.  She knows to call for any other issues that may develop before her next visit. A total of (30) minutes of face-to-face time was spent with this patient with greater than 50% of that time in counseling and care-coordination.   Wilber Bihari, NP  06/21/18 9:07 AM Medical Oncology and Hematology [facility] [address] [place], [place] [address] Tel. [phone]    Fax. [phone]

## 2018-06-23 ENCOUNTER — Other Ambulatory Visit: Payer: Self-pay | Admitting: General Surgery

## 2018-06-23 DIAGNOSIS — Z90722 Acquired absence of ovaries, bilateral: Secondary | ICD-10-CM | POA: Diagnosis not present

## 2018-06-23 DIAGNOSIS — Z90710 Acquired absence of both cervix and uterus: Secondary | ICD-10-CM | POA: Diagnosis not present

## 2018-06-23 DIAGNOSIS — C50411 Malignant neoplasm of upper-outer quadrant of right female breast: Secondary | ICD-10-CM | POA: Diagnosis not present

## 2018-06-23 DIAGNOSIS — Z171 Estrogen receptor negative status [ER-]: Principal | ICD-10-CM

## 2018-06-23 DIAGNOSIS — Z9079 Acquired absence of other genital organ(s): Secondary | ICD-10-CM | POA: Diagnosis not present

## 2018-06-24 ENCOUNTER — Other Ambulatory Visit: Payer: Self-pay

## 2018-06-24 ENCOUNTER — Encounter (HOSPITAL_BASED_OUTPATIENT_CLINIC_OR_DEPARTMENT_OTHER): Payer: Self-pay | Admitting: *Deleted

## 2018-06-24 ENCOUNTER — Other Ambulatory Visit: Payer: Self-pay | Admitting: General Surgery

## 2018-06-24 DIAGNOSIS — Z171 Estrogen receptor negative status [ER-]: Principal | ICD-10-CM

## 2018-06-24 DIAGNOSIS — C50411 Malignant neoplasm of upper-outer quadrant of right female breast: Secondary | ICD-10-CM

## 2018-06-25 ENCOUNTER — Ambulatory Visit
Admission: RE | Admit: 2018-06-25 | Discharge: 2018-06-25 | Disposition: A | Discharge status: Home / Self Care | Payer: Medicare Other | Source: Ambulatory Visit | Attending: General Surgery | Admitting: General Surgery

## 2018-06-25 DIAGNOSIS — C50411 Malignant neoplasm of upper-outer quadrant of right female breast: Secondary | ICD-10-CM

## 2018-06-25 DIAGNOSIS — C50911 Malignant neoplasm of unspecified site of right female breast: Secondary | ICD-10-CM | POA: Diagnosis not present

## 2018-06-25 DIAGNOSIS — Z171 Estrogen receptor negative status [ER-]: Principal | ICD-10-CM

## 2018-06-25 NOTE — Progress Notes (Signed)
Ensure pre surgery drink given with instructions to complete by 0500 dos, surgical soap given with instructions, pt verbalized understanding. 

## 2018-06-26 NOTE — H&P (Signed)
[name] Location: [facility] Patient #: [id number] DOB: 11/26/1945 Married / Language: English / Race: Black or African American Female       History of Present Illness  The patient is a 72 year old female who presents with breast cancer. This is a 72 year old female. Her daughter is with her throughout the encounter. She has completed neoadjuvant chemotherapy and is here to discuss and schedule definitive right breast surgery.  I initially saw her on December 23, 2017. She had noticed a lump in her right breast for a month. Mammograms in [place] showed a 3.7 cm lobulated calcified mass in upper outer quadrant of the right breast. She also has a densely calcified retroareolar mass in the left that's been there for years and is benign. They read multiple enlarged lymph nodes in the right accessory area area but MRI showed lymph nodes to be normal. 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 Image guided biopsy of the right axillary lymph node revealed only fatty tissue.  Staging workup included CT scan of chest abdomen and pelvis which showed the breast mass and benign arthritic changes at T11. No visceral metastasis. Bone scan showed uptake in T11. Echocardiogram showed ejection fraction 55-60%. MRI showed normal right axillary lymph nodes and in 3.3 cm mass in the right breast upper outer quadrant. Port-A-Cath was inserted. She has completed neoadjuvant chemotherapy.  Bilateral breast MRI was performed on June 16, 2018 and this shows that the right breast mass is solitary and has gone from 3.3 cm to 2.1 cm. There are no other abnormal findings. Lymph nodes looked normal. She can still feel the mass in her right breast and says it is much smaller. She is motivated for breast conservation and I think she is a good candidate for that.  Past history is  mostly negative. 3 pregnancies and 3 deliveries. TAH and BSO. Family history negative for breast or ovarian cancer. Maternal grandmother may have had colon cancer. The patient's mother is in her late 60s. Social history reveals she is married and lives and asked [place] with her husband. One son is a Administrator. One daughter, with patient today, lives in [place] and is a Equities trader with [employer]. Second daughter is a Equities trader and works at the [place] in [place]. Denies alcohol or tobacco.  We had a long talk about surgical management. We talked about lumpectomy, sentinel node biopsy, radiation therapy, mastectomy, breast reconstruction. She is inserted in breast conservation I think she is a good candidate for that She understands and agrees to radiation therapy. She knows that she will need radiation therapy to have equivalent local control to mastectomy She will be scheduled for right breast lumpectomy with radioactive seed localization, injection of blue dye, right axillary deep sentinel lymph node biopsy We discussed the indications, details, techniques, and numerous risk of the surgery with the patient and her daughter. They're aware of the risk of bleeding, infection, reoperation for positive margins or positive lymph nodes, nerve damage with chronic pain, cosmetic deformity, and other unforeseen problems. She understands all of these issues. All of her questions were answered. She agrees with this plan.  Dr. Jana Hakim requested the Port-A-Cath be left in     Allergies  No Known Drug Allergies Allergies Reconciled   Medication History  HYDROcodone-Acetaminophen (5-325MG Tablet, Oral) Active. No Current Medications (Taken starting 06/23/2018) Prochlorperazine Maleate (10MG Tablet,  Oral) Active. Ciprofloxacin HCl (500MG Tablet, Oral) Active. Ativan (0.5MG Tablet, Oral) Active. Aleve (220MG Capsule, Oral) Active.  Vitals  Weight:  166.5 lb Height: 62in Body Surface Area: 1.77 m Body Mass Index: 30.45 kg/m  Temp.: 64F(Oral)  Pulse: 100 (Regular)  Resp.: 19 (Unlabored)  P.OX: 98% (Room air) BP: 122/84 (Sitting, Left Arm, Standard)       Physical Exam  General Mental Status-Alert. General Appearance-Not in acute distress. Build & Nutrition-Well nourished. Posture-Normal posture. Gait-Normal. Note: Complete alopecia. BMI 30   Head and Neck Head-normocephalic, atraumatic with no lesions or palpable masses. Trachea-midline. Thyroid Gland Characteristics - normal size and consistency and no palpable nodules.  Chest and Lung Exam Chest and lung exam reveals -on auscultation, normal breath sounds, no adventitious sounds and normal vocal resonance.  Breast Note: Breasts are medium size. Right breast reveals a 2 cm mobile mass laterally at the 9:30 position nontender. Not fixed to skin or chest wall. This is half the size it was in March by my exam. No other mass in either breast. No palpable axillary adenopathy. No cervical adenopathy.   Cardiovascular Cardiovascular examination reveals -normal heart sounds, regular rate and rhythm with no murmurs and femoral artery auscultation bilaterally reveals normal pulses, no bruits, no thrills.  Abdomen Inspection Inspection of the abdomen reveals - No Hernias. Palpation/Percussion Palpation and Percussion of the abdomen reveal - Soft, Non Tender, No Rigidity (guarding), No hepatosplenomegaly and No Palpable abdominal masses.  Neurologic Neurologic evaluation reveals -alert and oriented x 3 with no impairment of recent or remote memory, normal attention span and ability to concentrate, normal sensation and normal coordination.  Musculoskeletal Normal Exam - Bilateral-Upper Extremity Strength Normal and Lower Extremity Strength Normal.    Assessment & Plan  PRIMARY CANCER OF UPPER OUTER QUADRANT OF RIGHT FEMALE BREAST  (C50.411)    You have had a nice response to the chemotherapy The cancer in your right breast is now about the size of a quarter, less than half the size it was in March Your lymph nodes feel normal Your lymph nodes are normal on MRI  We have discussed options for surgical intervention including lumpectomy and sentinel node and radiation therapy. II compared that to mastectomy with or without reconstruction You have stated you would like to keep your breast if you can, and I think you are a good candidate for that You'll be scheduled for right breast lumpectomy with radioactive seed localization and right axillary sentinel lymph node biopsy Your Port-A-Cath will be left in for now at Dr. [name] request We can remove your Port-A-Cath later once he is through using it. I have discussed the indications, techniques, and risk of the surgery in detail with you and your daughter  HISTORY OF TOTAL ABDOMINAL HYSTERECTOMY AND BILATERAL SALPINGO-OOPHORECTOMY (Z90.710)     Edsel Petrin. Dalbert Batman, M.D., [facility] General and Minimally invasive Surgery Breast and Colorectal Surgery Office:   [phone] Pager:   [phone]

## 2018-06-28 ENCOUNTER — Encounter (HOSPITAL_BASED_OUTPATIENT_CLINIC_OR_DEPARTMENT_OTHER): Payer: Self-pay

## 2018-06-28 ENCOUNTER — Ambulatory Visit (HOSPITAL_BASED_OUTPATIENT_CLINIC_OR_DEPARTMENT_OTHER): Payer: Medicare Other | Admitting: Anesthesiology

## 2018-06-28 ENCOUNTER — Ambulatory Visit (HOSPITAL_COMMUNITY)
Admission: RE | Admit: 2018-06-28 | Discharge: 2018-06-28 | Disposition: A | Discharge status: Home / Self Care | Payer: Medicare Other | Source: Ambulatory Visit | Attending: General Surgery | Admitting: General Surgery

## 2018-06-28 ENCOUNTER — Ambulatory Visit
Admission: RE | Admit: 2018-06-28 | Discharge: 2018-06-28 | Disposition: A | Discharge status: Home / Self Care | Payer: Medicare Other | Source: Ambulatory Visit | Attending: General Surgery | Admitting: General Surgery

## 2018-06-28 ENCOUNTER — Other Ambulatory Visit: Payer: Self-pay

## 2018-06-28 ENCOUNTER — Ambulatory Visit (HOSPITAL_BASED_OUTPATIENT_CLINIC_OR_DEPARTMENT_OTHER)
Admission: RE | Admit: 2018-06-28 | Discharge: 2018-06-28 | Disposition: A | Discharge status: Home / Self Care | Payer: Medicare Other | Source: Ambulatory Visit | Attending: General Surgery | Admitting: General Surgery

## 2018-06-28 ENCOUNTER — Encounter (HOSPITAL_BASED_OUTPATIENT_CLINIC_OR_DEPARTMENT_OTHER)
Admission: RE | Disposition: A | Discharge status: Home / Self Care | Payer: Self-pay | Source: Ambulatory Visit | Attending: General Surgery

## 2018-06-28 DIAGNOSIS — Z9221 Personal history of antineoplastic chemotherapy: Secondary | ICD-10-CM | POA: Diagnosis not present

## 2018-06-28 DIAGNOSIS — Z171 Estrogen receptor negative status [ER-]: Secondary | ICD-10-CM

## 2018-06-28 DIAGNOSIS — C50411 Malignant neoplasm of upper-outer quadrant of right female breast: Secondary | ICD-10-CM | POA: Diagnosis not present

## 2018-06-28 DIAGNOSIS — G8918 Other acute postprocedural pain: Secondary | ICD-10-CM | POA: Diagnosis not present

## 2018-06-28 DIAGNOSIS — Z79899 Other long term (current) drug therapy: Secondary | ICD-10-CM | POA: Diagnosis not present

## 2018-06-28 DIAGNOSIS — F329 Major depressive disorder, single episode, unspecified: Secondary | ICD-10-CM | POA: Insufficient documentation

## 2018-06-28 DIAGNOSIS — Z6831 Body mass index (BMI) 31.0-31.9, adult: Secondary | ICD-10-CM | POA: Insufficient documentation

## 2018-06-28 DIAGNOSIS — E669 Obesity, unspecified: Secondary | ICD-10-CM | POA: Diagnosis not present

## 2018-06-28 DIAGNOSIS — C50911 Malignant neoplasm of unspecified site of right female breast: Secondary | ICD-10-CM | POA: Diagnosis not present

## 2018-06-28 DIAGNOSIS — R928 Other abnormal and inconclusive findings on diagnostic imaging of breast: Secondary | ICD-10-CM | POA: Diagnosis not present

## 2018-06-28 DIAGNOSIS — K219 Gastro-esophageal reflux disease without esophagitis: Secondary | ICD-10-CM | POA: Diagnosis not present

## 2018-06-28 DIAGNOSIS — Z791 Long term (current) use of non-steroidal anti-inflammatories (NSAID): Secondary | ICD-10-CM | POA: Insufficient documentation

## 2018-06-28 HISTORY — PX: BREAST LUMPECTOMY WITH RADIOACTIVE SEED AND SENTINEL LYMPH NODE BIOPSY: SHX6550

## 2018-06-28 SURGERY — BREAST LUMPECTOMY WITH RADIOACTIVE SEED AND SENTINEL LYMPH NODE BIOPSY
Anesthesia: General | Site: Breast | Laterality: Right

## 2018-06-28 MED ORDER — ACETAMINOPHEN 325 MG PO TABS: 650.0000 mg | ORAL_TABLET | ORAL | Status: DC | PRN

## 2018-06-28 MED ORDER — FENTANYL CITRATE (PF) 100 MCG/2ML IJ SOLN: 25.0000 ug | INTRAMUSCULAR | Status: DC | PRN

## 2018-06-28 MED ORDER — BUPIVACAINE HCL (PF) 0.5 % IJ SOLN
INTRAMUSCULAR | Status: AC
  Filled 2018-06-28: qty 30

## 2018-06-28 MED ORDER — TECHNETIUM TC 99M SULFUR COLLOID FILTERED
1.0000 | Freq: Once | INTRAVENOUS | Status: AC | PRN
  Administered 2018-06-28: 1 via INTRADERMAL

## 2018-06-28 MED ORDER — LACTATED RINGERS IV SOLN
INTRAVENOUS | Status: DC
  Administered 2018-06-28 (×2): via INTRAVENOUS

## 2018-06-28 MED ORDER — CEFAZOLIN SODIUM-DEXTROSE 2-4 GM/100ML-% IV SOLN
INTRAVENOUS | Status: AC
  Filled 2018-06-28: qty 100

## 2018-06-28 MED ORDER — MIDAZOLAM HCL 2 MG/2ML IJ SOLN
INTRAMUSCULAR | Status: AC
  Filled 2018-06-28: qty 2

## 2018-06-28 MED ORDER — ONDANSETRON HCL 4 MG/2ML IJ SOLN
INTRAMUSCULAR | Status: AC
  Filled 2018-06-28: qty 2

## 2018-06-28 MED ORDER — SODIUM CHLORIDE 0.9 % IV SOLN: 250.0000 mL | INTRAVENOUS | Status: DC | PRN

## 2018-06-28 MED ORDER — EPHEDRINE SULFATE-NACL 50-0.9 MG/10ML-% IV SOSY
PREFILLED_SYRINGE | INTRAVENOUS | Status: DC | PRN
  Administered 2018-06-28 (×2): 10 mg via INTRAVENOUS

## 2018-06-28 MED ORDER — CHLORHEXIDINE GLUCONATE CLOTH 2 % EX PADS: 6.0000 | MEDICATED_PAD | Freq: Once | CUTANEOUS | Status: DC

## 2018-06-28 MED ORDER — FENTANYL CITRATE (PF) 100 MCG/2ML IJ SOLN
INTRAMUSCULAR | Status: DC | PRN
  Administered 2018-06-28: 25 ug via INTRAVENOUS

## 2018-06-28 MED ORDER — FENTANYL CITRATE (PF) 100 MCG/2ML IJ SOLN
50.0000 ug | INTRAMUSCULAR | Status: DC | PRN
  Administered 2018-06-28: 25 ug via INTRAVENOUS
  Administered 2018-06-28: 50 ug via INTRAVENOUS

## 2018-06-28 MED ORDER — LIDOCAINE HCL (CARDIAC) PF 100 MG/5ML IV SOSY
PREFILLED_SYRINGE | INTRAVENOUS | Status: DC | PRN
  Administered 2018-06-28: 50 mg via INTRAVENOUS

## 2018-06-28 MED ORDER — LIDOCAINE 2% (20 MG/ML) 5 ML SYRINGE
INTRAMUSCULAR | Status: AC
  Filled 2018-06-28: qty 5

## 2018-06-28 MED ORDER — LACTATED RINGERS IV SOLN: INTRAVENOUS | Status: DC

## 2018-06-28 MED ORDER — SCOPOLAMINE 1 MG/3DAYS TD PT72: 1.0000 | MEDICATED_PATCH | Freq: Once | TRANSDERMAL | Status: DC | PRN

## 2018-06-28 MED ORDER — PROPOFOL 10 MG/ML IV BOLUS
INTRAVENOUS | Status: DC | PRN
  Administered 2018-06-28: 100 mg via INTRAVENOUS

## 2018-06-28 MED ORDER — CEFAZOLIN SODIUM-DEXTROSE 2-4 GM/100ML-% IV SOLN
2.0000 g | INTRAVENOUS | Status: AC
  Administered 2018-06-28: 2 g via INTRAVENOUS

## 2018-06-28 MED ORDER — FENTANYL CITRATE (PF) 100 MCG/2ML IJ SOLN
INTRAMUSCULAR | Status: AC
  Filled 2018-06-28: qty 2

## 2018-06-28 MED ORDER — HYDROCODONE-ACETAMINOPHEN 5-325 MG PO TABS: 1.0000 | ORAL_TABLET | Freq: Four times a day (QID) | ORAL | 0 refills | Status: DC | PRN

## 2018-06-28 MED ORDER — OXYCODONE HCL 5 MG PO TABS: 5.0000 mg | ORAL_TABLET | ORAL | Status: DC | PRN

## 2018-06-28 MED ORDER — ROPIVACAINE HCL 7.5 MG/ML IJ SOLN
INTRAMUSCULAR | Status: DC | PRN
  Administered 2018-06-28: 20 mL via PERINEURAL

## 2018-06-28 MED ORDER — SODIUM CHLORIDE 0.9 % IJ SOLN
INTRAMUSCULAR | Status: AC
  Filled 2018-06-28: qty 10

## 2018-06-28 MED ORDER — GABAPENTIN 300 MG PO CAPS
300.0000 mg | ORAL_CAPSULE | ORAL | Status: AC
  Administered 2018-06-28: 300 mg via ORAL

## 2018-06-28 MED ORDER — ACETAMINOPHEN 500 MG PO TABS
1000.0000 mg | ORAL_TABLET | ORAL | Status: AC
  Administered 2018-06-28: 1000 mg via ORAL

## 2018-06-28 MED ORDER — ONDANSETRON HCL 4 MG/2ML IJ SOLN: 4.0000 mg | Freq: Once | INTRAMUSCULAR | Status: DC | PRN

## 2018-06-28 MED ORDER — ACETAMINOPHEN 650 MG RE SUPP: 650.0000 mg | RECTAL | Status: DC | PRN

## 2018-06-28 MED ORDER — GABAPENTIN 300 MG PO CAPS
ORAL_CAPSULE | ORAL | Status: AC
  Filled 2018-06-28: qty 1

## 2018-06-28 MED ORDER — BUPIVACAINE-EPINEPHRINE 0.5% -1:200000 IJ SOLN
INTRAMUSCULAR | Status: DC | PRN
  Administered 2018-06-28: 10 mL

## 2018-06-28 MED ORDER — ONDANSETRON HCL 4 MG/2ML IJ SOLN
INTRAMUSCULAR | Status: DC | PRN
  Administered 2018-06-28: 4 mg via INTRAVENOUS

## 2018-06-28 MED ORDER — BUPIVACAINE-EPINEPHRINE (PF) 0.25% -1:200000 IJ SOLN
INTRAMUSCULAR | Status: AC
  Filled 2018-06-28: qty 60

## 2018-06-28 MED ORDER — SODIUM CHLORIDE 0.9% FLUSH: 3.0000 mL | Freq: Two times a day (BID) | INTRAVENOUS | Status: DC

## 2018-06-28 MED ORDER — MIDAZOLAM HCL 2 MG/2ML IJ SOLN
1.0000 mg | INTRAMUSCULAR | Status: DC | PRN
  Administered 2018-06-28: 1 mg via INTRAVENOUS

## 2018-06-28 MED ORDER — SODIUM CHLORIDE 0.9% FLUSH: 3.0000 mL | INTRAVENOUS | Status: DC | PRN

## 2018-06-28 MED ORDER — ACETAMINOPHEN 500 MG PO TABS
ORAL_TABLET | ORAL | Status: AC
  Filled 2018-06-28: qty 2

## 2018-06-28 MED ORDER — METHYLENE BLUE 0.5 % INJ SOLN
INTRAVENOUS | Status: AC
  Filled 2018-06-28: qty 10

## 2018-06-28 MED ORDER — SODIUM CHLORIDE 0.9 % IJ SOLN
INTRAVENOUS | Status: DC | PRN
  Administered 2018-06-28: 4 mL

## 2018-06-28 MED ORDER — FENTANYL CITRATE (PF) 100 MCG/2ML IJ SOLN
25.0000 ug | INTRAMUSCULAR | Status: DC | PRN
  Administered 2018-06-28: 25 ug via INTRAVENOUS

## 2018-06-28 MED ORDER — EPHEDRINE 5 MG/ML INJ
INTRAVENOUS | Status: AC
  Filled 2018-06-28: qty 10

## 2018-06-28 MED ORDER — BUPIVACAINE HCL (PF) 0.25 % IJ SOLN
INTRAMUSCULAR | Status: AC
  Filled 2018-06-28: qty 30

## 2018-06-28 SURGICAL SUPPLY — 64 items
APPLIER CLIP 11 MED OPEN (CLIP) ×3
BANDAGE ACE 6X5 VEL STRL LF (GAUZE/BANDAGES/DRESSINGS) IMPLANT
BINDER BREAST LRG (GAUZE/BANDAGES/DRESSINGS) ×3 IMPLANT
BINDER BREAST MEDIUM (GAUZE/BANDAGES/DRESSINGS) IMPLANT
BINDER BREAST XLRG (GAUZE/BANDAGES/DRESSINGS) IMPLANT
BINDER BREAST XXLRG (GAUZE/BANDAGES/DRESSINGS) IMPLANT
BLADE HEX COATED 2.75 (ELECTRODE) ×3 IMPLANT
BLADE SURG 15 STRL LF DISP TIS (BLADE) ×2 IMPLANT
BLADE SURG 15 STRL SS (BLADE) ×4
CANISTER SUCT 1200ML W/VALVE (MISCELLANEOUS) ×3 IMPLANT
CHLORAPREP W/TINT 26ML (MISCELLANEOUS) ×3 IMPLANT
CLIP APPLIE 11 MED OPEN (CLIP) ×1 IMPLANT
COVER BACK TABLE 60X90IN (DRAPES) ×3 IMPLANT
COVER MAYO STAND STRL (DRAPES) ×3 IMPLANT
COVER PROBE W GEL 5X96 (DRAPES) ×3 IMPLANT
DECANTER SPIKE VIAL GLASS SM (MISCELLANEOUS) IMPLANT
DERMABOND ADVANCED (GAUZE/BANDAGES/DRESSINGS) ×4
DERMABOND ADVANCED .7 DNX12 (GAUZE/BANDAGES/DRESSINGS) ×2 IMPLANT
DEVICE DUBIN W/COMP PLATE 8390 (MISCELLANEOUS) ×3 IMPLANT
DRAPE LAPAROSCOPIC ABDOMINAL (DRAPES) ×3 IMPLANT
DRAPE UTILITY XL STRL (DRAPES) ×3 IMPLANT
DRSG PAD ABDOMINAL 8X10 ST (GAUZE/BANDAGES/DRESSINGS) IMPLANT
ELECT REM PT RETURN 9FT ADLT (ELECTROSURGICAL) ×3
ELECTRODE REM PT RTRN 9FT ADLT (ELECTROSURGICAL) ×1 IMPLANT
GAUZE SPONGE 4X4 12PLY STRL (GAUZE/BANDAGES/DRESSINGS) ×3 IMPLANT
GAUZE SPONGE 4X4 12PLY STRL LF (GAUZE/BANDAGES/DRESSINGS) IMPLANT
GLOVE BIO SURGEON STRL SZ 6.5 (GLOVE) ×2 IMPLANT
GLOVE BIO SURGEONS STRL SZ 6.5 (GLOVE) ×1
GLOVE BIOGEL PI IND STRL 7.0 (GLOVE) ×1 IMPLANT
GLOVE BIOGEL PI IND STRL 8 (GLOVE) ×1 IMPLANT
GLOVE BIOGEL PI INDICATOR 7.0 (GLOVE) ×2
GLOVE BIOGEL PI INDICATOR 8 (GLOVE) ×2
GLOVE EUDERMIC 7 POWDERFREE (GLOVE) ×6 IMPLANT
GOWN STRL REUS W/ TWL LRG LVL3 (GOWN DISPOSABLE) IMPLANT
GOWN STRL REUS W/ TWL XL LVL3 (GOWN DISPOSABLE) ×1 IMPLANT
GOWN STRL REUS W/TWL LRG LVL3 (GOWN DISPOSABLE)
GOWN STRL REUS W/TWL XL LVL3 (GOWN DISPOSABLE) ×2
ILLUMINATOR WAVEGUIDE N/F (MISCELLANEOUS) IMPLANT
KIT MARKER MARGIN INK (KITS) ×3 IMPLANT
LIGHT WAVEGUIDE WIDE FLAT (MISCELLANEOUS) IMPLANT
NDL SAFETY ECLIPSE 18X1.5 (NEEDLE) ×1 IMPLANT
NEEDLE HYPO 18GX1.5 SHARP (NEEDLE) ×2
NEEDLE HYPO 25X1 1.5 SAFETY (NEEDLE) ×6 IMPLANT
NS IRRIG 1000ML POUR BTL (IV SOLUTION) ×3 IMPLANT
PACK BASIN DAY SURGERY FS (CUSTOM PROCEDURE TRAY) ×3 IMPLANT
PAD ALCOHOL SWAB (MISCELLANEOUS) ×3 IMPLANT
PENCIL BUTTON HOLSTER BLD 10FT (ELECTRODE) ×3 IMPLANT
SHEET MEDIUM DRAPE 40X70 STRL (DRAPES) ×3 IMPLANT
SLEEVE SCD COMPRESS KNEE MED (MISCELLANEOUS) ×3 IMPLANT
SPONGE LAP 18X18 RF (DISPOSABLE) IMPLANT
SPONGE LAP 4X18 RFD (DISPOSABLE) ×3 IMPLANT
SUT MNCRL AB 4-0 PS2 18 (SUTURE) ×3 IMPLANT
SUT SILK 2 0 SH (SUTURE) ×3 IMPLANT
SUT VIC AB 2-0 CT1 27 (SUTURE)
SUT VIC AB 2-0 CT1 TAPERPNT 27 (SUTURE) IMPLANT
SUT VIC AB 3-0 SH 27 (SUTURE)
SUT VIC AB 3-0 SH 27X BRD (SUTURE) IMPLANT
SUT VICRYL 3-0 CR8 SH (SUTURE) ×3 IMPLANT
SYR 10ML LL (SYRINGE) ×6 IMPLANT
TOWEL GREEN STERILE FF (TOWEL DISPOSABLE) ×6 IMPLANT
TOWEL OR NON WOVEN STRL DISP B (DISPOSABLE) ×3 IMPLANT
TUBE CONNECTING 20'X1/4 (TUBING) ×1
TUBE CONNECTING 20X1/4 (TUBING) ×2 IMPLANT
YANKAUER SUCT BULB TIP NO VENT (SUCTIONS) ×3 IMPLANT

## 2018-06-28 NOTE — Anesthesia Procedure Notes (Addendum)
Anesthesia Regional Block: Pectoralis block   Pre-Anesthetic Checklist: ,, timeout performed, Correct Patient, Correct Site, Correct Laterality, Correct Procedure, Correct Position, site marked, Risks and benefits discussed,  Surgical consent,  Pre-op evaluation,  At surgeon's request and post-op pain management  Laterality: Right  Prep: chloraprep       Needles:  Injection technique: Single-shot  Needle Type: Echogenic Needle     Needle Length: 9cm  Needle Gauge: 21     Additional Needles:   Procedures:,,,, ultrasound used (permanent image in chart),,,,  Narrative:  Start time: 06/28/2018 8:24 AM End time: 06/28/2018 8:30 AM Injection made incrementally with aspirations every 5 mL.  Performed by: Personally  Anesthesiologist: Catalina Gravel, MD  Additional Notes: No pain on injection. No increased resistance to injection. Injection made in 5cc increments.  Good needle visualization.  Patient tolerated procedure well.

## 2018-06-28 NOTE — Anesthesia Preprocedure Evaluation (Addendum)
Anesthesia Evaluation  Patient identified by MRN, date of birth, ID band Patient awake    Reviewed: Allergy & Precautions, NPO status , Patient's Chart, lab work & pertinent test results  Airway Mallampati: II  TM Distance: >3 FB Neck ROM: Full    Dental  (+) Dental Advisory Given, Lower Dentures, Upper Dentures   Pulmonary neg pulmonary ROS,    Pulmonary exam normal breath sounds clear to auscultation       Cardiovascular negative cardio ROS Normal cardiovascular exam Rhythm:Regular Rate:Normal  Echo 04/02/18: Study Conclusions  - Left ventricle: The cavity size was normal. Wall thickness was increased in a pattern of mild LVH. Systolic function was normal. The estimated ejection fraction was in the range of 60% to 65%. Wall motion was normal; there were no regional wall motion abnormalities. Doppler parameters are consistent with abnormal left ventricular relaxation (grade 1 diastolic dysfunction). - Aortic valve: There was mild regurgitation. - Left atrium: The atrium was mildly dilated.   Neuro/Psych PSYCHIATRIC DISORDERS Depression negative neurological ROS     GI/Hepatic Neg liver ROS, GERD  Medicated,  Endo/Other  Obesity   Renal/GU negative Renal ROS     Musculoskeletal  (+) Arthritis ,   Abdominal   Peds  Hematology  (+) Blood dyscrasia (Thrombocytopenia- Plt 80k), anemia ,   Anesthesia Other Findings Day of surgery medications reviewed with the patient.  Right breast cancer   Reproductive/Obstetrics                            Anesthesia Physical Anesthesia Plan  ASA: II  Anesthesia Plan: General   Post-op Pain Management:    Induction: Intravenous  PONV Risk Score and Plan: 3 and Midazolam, Dexamethasone and Ondansetron  Airway Management Planned: LMA  Additional Equipment:   Intra-op Plan:   Post-operative Plan: Extubation in OR  Informed Consent: I have  reviewed the patients History and Physical, chart, labs and discussed the procedure including the risks, benefits and alternatives for the proposed anesthesia with the patient or authorized representative who has indicated his/her understanding and acceptance.   Dental advisory given  Plan Discussed with: CRNA  Anesthesia Plan Comments:         Anesthesia Quick Evaluation

## 2018-06-28 NOTE — Transfer of Care (Signed)
Immediate Anesthesia Transfer of Care Note  Patient: Veronica Price  Procedure(s) Performed: BREAST LUMPECTOMY WITH RADIOACTIVE SEED AND SENTINEL LYMPH NODE BIOPSY (Right Breast)  Patient Location: PACU  Anesthesia Type:General  Level of Consciousness: awake, alert  and oriented  Airway & Oxygen Therapy: Patient Spontanous Breathing and Patient connected to nasal cannula oxygen  Post-op Assessment: Report given to RN and Post -op Vital signs reviewed and stable  Post vital signs: Reviewed and stable  Last Vitals:  Vitals Value Taken Time  BP 130/64 06/28/2018 10:39 AM  Temp    Pulse 69 06/28/2018 10:40 AM  Resp 12 06/28/2018 10:40 AM  SpO2 100 % 06/28/2018 10:40 AM  Vitals shown include unvalidated device data.  Last Pain:  Vitals:   06/28/18 0627  TempSrc: Oral  PainSc: 0-No pain         Complications: No apparent anesthesia complications

## 2018-06-28 NOTE — Discharge Instructions (Signed)
[facility] °Office Phone Number [phone] ° °BREAST BIOPSY/ PARTIAL MASTECTOMY: POST OP INSTRUCTIONS ° °Always review your discharge instruction sheet given to you by the facility where your surgery was performed. ° °IF YOU HAVE DISABILITY OR FAMILY LEAVE FORMS, YOU MUST BRING THEM TO THE OFFICE FOR PROCESSING.  DO NOT GIVE THEM TO YOUR DOCTOR. ° °1. A prescription for pain medication may be given to you upon discharge.  Take your pain medication as prescribed, if needed.  If narcotic pain medicine is not needed, then you may take acetaminophen (Tylenol) or ibuprofen (Advil) as needed. °2. Take your usually prescribed medications unless otherwise directed °3. If you need a refill on your pain medication, please contact your pharmacy.  They will contact our office to request authorization.  Prescriptions will not be filled after 5pm or on week-ends. °4. You should eat very light the first 24 hours after surgery, such as soup, crackers, pudding, etc.  Resume your normal diet the day after surgery. °5. Most patients will experience some swelling and bruising in the breast.  Ice packs and a good support bra will help.  Swelling and bruising can take several days to resolve.  °6. It is common to experience some constipation if taking pain medication after surgery.  Increasing fluid intake and taking a stool softener will usually help or prevent this problem from occurring.  A mild laxative (Milk of Magnesia or Miralax) should be taken according to package directions if there are no bowel movements after 48 hours. °7. Unless discharge instructions indicate otherwise, you may remove your bandages 24-48 hours after surgery, and you may shower at that time.  You may have steri-strips (small skin tapes) in place directly over the incision.  These strips should be left on the skin for 7-10 days.  If your surgeon used skin glue on the incision, you may shower in 24 hours.  The glue will flake off over the  next 2-3 weeks.  Any sutures or staples will be removed at the office during your follow-up visit. °8. ACTIVITIES:  You may resume regular daily activities (gradually increasing) beginning the next day.  Wearing a good support bra or sports bra minimizes pain and swelling.  You may have sexual intercourse when it is comfortable. °a. You may drive when you no longer are taking prescription pain medication, you can comfortably wear a seatbelt, and you can safely maneuver your car and apply brakes. °b. RETURN TO WORK:  ______________________________________________________________________________________ °9. You should see your doctor in the office for a follow-up appointment approximately two weeks after your surgery.  Your doctor’s nurse will typically make your follow-up appointment when she calls you with your pathology report.  Expect your pathology report 2-3 business days after your surgery.  You may call to check if you do not hear from us after three days. °10. OTHER INSTRUCTIONS: _______________________________________________________________________________________________ _____________________________________________________________________________________________________________________________________ °_____________________________________________________________________________________________________________________________________ °_____________________________________________________________________________________________________________________________________ ° °WHEN TO CALL YOUR DOCTOR: °1. Fever over 101.0 °2. Nausea and/or vomiting. °3. Extreme swelling or bruising. °4. Continued bleeding from incision. °5. Increased pain, redness, or drainage from the incision. ° °The clinic staff is available to answer your questions during regular business hours.  Please don’t hesitate to call and ask to speak to one of the nurses for clinical concerns.  If you have a medical emergency, go to the nearest  emergency room or call 911.  A surgeon from [facility] is always on call at the hospital. ° °For further questions, please visit [email or web address]  ° ° ° ° ° ° °  Post Anesthesia Home Care Instructions ° °Activity: °Get plenty of rest for the remainder of the day. A responsible individual must stay with you for 24 hours following the procedure.  °For the next 24 hours, DO NOT: °-Drive a car °-Operate machinery °-Drink alcoholic beverages °-Take any medication unless instructed by your physician °-Make any legal decisions or sign important papers. ° °Meals: °Start with liquid foods such as gelatin or soup. Progress to regular foods as tolerated. Avoid greasy, spicy, heavy foods. If nausea and/or vomiting occur, drink only clear liquids until the nausea and/or vomiting subsides. Call your physician if vomiting continues. ° °Special Instructions/Symptoms: °Your throat may feel dry or sore from the anesthesia or the breathing tube placed in your throat during surgery. If this causes discomfort, gargle with warm salt water. The discomfort should disappear within 24 hours. ° °If you had a scopolamine patch placed behind your ear for the management of post- operative nausea and/or vomiting: ° °1. The medication in the patch is effective for 72 hours, after which it should be removed.  Wrap patch in a tissue and discard in the trash. Wash hands thoroughly with soap and water. °2. You may remove the patch earlier than 72 hours if you experience unpleasant side effects which may include dry mouth, dizziness or visual disturbances. °3. Avoid touching the patch. Wash your hands with soap and water after contact with the patch. °  ° ° ° ° ° ° ° °Regional Anesthesia Blocks ° °1. Numbness or the inability to move the "blocked" extremity may last from 3-48 hours after placement. The length of time depends on the medication injected and your individual response to the medication. If the numbness is not going  away after 48 hours, call your surgeon. ° °2. The extremity that is blocked will need to be protected until the numbness is gone and the  Strength has returned. Because you cannot feel it, you will need to take extra care to avoid injury. Because it may be weak, you may have difficulty moving it or using it. You may not know what position it is in without looking at it while the block is in effect. ° °3. For blocks in the legs and feet, returning to weight bearing and walking needs to be done carefully. You will need to wait until the numbness is entirely gone and the strength has returned. You should be able to move your leg and foot normally before you try and bear weight or walk. You will need someone to be with you when you first try to ensure you do not fall and possibly risk injury. ° °4. Bruising and tenderness at the needle site are common side effects and will resolve in a few days. ° °5. Persistent numbness or new problems with movement should be communicated to the surgeon or the [facility] ([phone])/ [facility] ([phone]). °

## 2018-06-28 NOTE — Op Note (Signed)
Patient Name:           Veronica Price   Date of Surgery:        06/28/2018  Pre op Diagnosis:      Cancer right breast upper outer quadrant                                       History neoadjuvant chemotherapy  Post op Diagnosis:    Same  Procedure:                 Inject blue dye right breast                                      Right breast lumpectomy with radioactive seed localization                                      Reexcision lateral margin                                      Right axillary deep sentinel lymph node biopsy  Surgeon:                     Edsel Petrin. Dalbert Batman, M.D., FACS  Assistant:                      OR staff  Operative Indications:    This is a 72 year old female with cancer of the right breast. She has completed neoadjuvant chemotherapy and is brought to the operating room for definitive right breast surgery.       I initially saw her on December 23, 2017. She had noticed a lump in her right breast for a month. Mammograms in [place] showed a 3.7 cm lobulated calcified mass in upper outer quadrant of the right breast. She also has a densely calcified retroareolar mass on the left that's been there for years and is benign. They read multiple enlarged lymph nodes in the right axillary area area but MRI showed lymph nodes to be normal. 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 Image guided biopsy of the right axillary lymph node revealed only fatty tissue.      Staging workup included CT scan of chest abdomen and pelvis which showed the breast mass and benign arthritic changes at T11. No visceral metastasis. Bone scan showed uptake in T11. Echocardiogram showed ejection fraction 55-60%. MRI showed normal right axillary lymph nodes and in 3.3 cm mass in the right breast upper outer quadrant. Port-A-Cath was inserted. She has completed neoadjuvant  chemotherapy.  Bilateral breast MRI was performed on June 16, 2018 and this shows that the right breast mass is solitary and has gone from 3.3 cm to 2.1 cm. There are no other abnormal findings. Lymph nodes looked normal. She can still feel the mass in her right breast and says it is much smaller. She is motivated for breast conservation and I think she is a good candidate for that. Family history negative for breast or ovarian cancer.      We  had a long talk about surgical management. We talked about lumpectomy, sentinel node biopsy, radiation therapy, mastectomy, breast reconstruction. She is inserted in breast conservation I think she is a good candidate for that She will be scheduled for right breast lumpectomy with radioactive seed localization, injection of blue dye, right axillary deep sentinel lymph node biopsy.  The Port-A-Cath will be left in   Operative Findings:       Pectoral block was performed by anesthesia.  Radionuclide was injected into the right breast by the nuclear medicine technician.  The mass felt at least 2 cm in diameter.  This was excised through a generous curvilinear incision in the upper outer right breast.  The broad anterior margin is the skin.  The broad posterior margin is the axilla and the lateral aspect of the pectoralis major muscle.  The specimen mammogram looked very good with the mass and the clip in the center of the specimen.  I reexcised the lateral margin.  I found 2 sentinel lymph nodes that were hot and blue.  Procedure in Detail:          Following the induction of general LMA anesthesia a surgical timeout was performed.  Intravenous antibiotics were given.  Following alcohol prep I injected 5 cc of dilute methylene blue into the right breast subareolar area and massaged the breast for a few minutes.  The entire right chest wall and axilla were then prepped and draped in a sterile fashion.  0.5% Marcaine with epinephrine was used as local  infiltration anesthetic for the more superficial tissues.      Using the neoprobe I isolated the radioactive signal which was consistent with the palpable mass.  I planned a curvilinear incision in the upper outer quadrant which was probably 10 cm in length.  The incision was made with a knife.  Generous lumpectomy was performed trying to go widely around the palpable mass.  I undermined the skin anteriorly so that the anterior margin is the skin and dermis.  The lumpectomy was taken all the way down to the pectoralis major muscle and the axillary space.  The lumpectomy specimen was removed.  It was marked with silk sutures and a 6 color ink kit.  Specimen mammogram looked very good as mentioned above.  The specimen was sent to the lab where the seed was retrieved.       By palpation I thought I might be a little close laterally so I reexcised the lateral margin and sent that as a separate specimen.      I found that my lumpectomy incision was high enough to where I could do the sentinel node biopsy through the same incision.  Sentinel nodes were traced out using the neoprobe and I found to sentinel nodes that were hot and blue.  That was all that we found.  Those were sent as separate specimens.  Hemostasis was excellent and achieved with electrocautery and metal clips.  The wound was irrigated.  All the tissues were reapproximated with interrupted 3-0 Vicryl sutures and the skin closed with a running subcuticular 4-0 Monocryl and Dermabond.  Breast binder was placed and the patient taken to PACU in stable condition.  EBL 20 cc.  Counts correct.  Complications none.    Addendum: I logged onto the [employer] and reviewed her prescription medication history.     Edsel Petrin. Dalbert Batman, M.D., FACS General and Minimally Invasive Surgery Breast and Colorectal Surgery  06/28/2018 10:31 AM

## 2018-06-28 NOTE — Progress Notes (Signed)
Assisted Dr. Gifford Shave with right, ultrasound guided, pectoralis block. Side rails up, monitors on throughout procedure. See vital signs in flow sheet. Tolerated Procedure well.

## 2018-06-28 NOTE — Interval H&P Note (Signed)
History and Physical Interval Note:  06/28/2018 8:38 AM  Veronica Price  has presented today for surgery, with the diagnosis of RIGHT BREAST CANCER  The various methods of treatment have been discussed with the patient and family. After consideration of risks, benefits and other options for treatment, the patient has consented to  Procedure(s): BREAST LUMPECTOMY WITH RADIOACTIVE SEED AND SENTINEL LYMPH NODE BIOPSY (Right) as a surgical intervention .  The patient's history has been reviewed, patient examined, no change in status, stable for surgery.  I have reviewed the patient's chart and labs.  Questions were answered to the patient's satisfaction.     Adin Hector

## 2018-06-28 NOTE — Anesthesia Procedure Notes (Signed)
Procedure Name: LMA Insertion Date/Time: 06/28/2018 9:19 AM Performed by: Bufford Spikes, CRNA Pre-anesthesia Checklist: Patient identified, Emergency Drugs available, Suction available and Patient being monitored Patient Re-evaluated:Patient Re-evaluated prior to induction Oxygen Delivery Method: Circle system utilized Preoxygenation: Pre-oxygenation with 100% oxygen Induction Type: IV induction Ventilation: Mask ventilation without difficulty LMA: LMA inserted LMA Size: 3.0 Number of attempts: 1 Airway Equipment and Method: Bite block Placement Confirmation: positive ETCO2 Tube secured with: Tape Dental Injury: Teeth and Oropharynx as per pre-operative assessment

## 2018-06-28 NOTE — Anesthesia Postprocedure Evaluation (Signed)
Anesthesia Post Note  Patient: Veronica Price  Procedure(s) Performed: BREAST LUMPECTOMY WITH RADIOACTIVE SEED AND SENTINEL LYMPH NODE BIOPSY (Right Breast)     Patient location during evaluation: PACU Anesthesia Type: General and Regional Level of consciousness: awake and alert Pain management: pain level controlled Vital Signs Assessment: post-procedure vital signs reviewed and stable Respiratory status: spontaneous breathing, nonlabored ventilation, respiratory function stable and patient connected to nasal cannula oxygen Cardiovascular status: blood pressure returned to baseline and stable Postop Assessment: no apparent nausea or vomiting Anesthetic complications: no    Last Vitals:  Vitals:   06/28/18 1130 06/28/18 1224  BP: 133/75 135/65  Pulse: 64 63  Resp: 14 16  Temp:  36.5 C  SpO2: 99% 97%    Last Pain:  Vitals:   06/28/18 1224  TempSrc:   PainSc: 4                  Gerritt Galentine P Anvita Hirata

## 2018-06-29 ENCOUNTER — Encounter (HOSPITAL_BASED_OUTPATIENT_CLINIC_OR_DEPARTMENT_OTHER): Payer: Self-pay | Admitting: General Surgery

## 2018-06-29 NOTE — Addendum Note (Signed)
Addendum  created 06/29/18 8250 by Forney Kleinpeter, Ernesta Amble, CRNA   Charge Capture section accepted

## 2018-06-30 NOTE — Progress Notes (Signed)
Inform patient of Pathology report,. Cancer was 2.2 cm. Completely removed with negative margins. Lymph nodes negative. Good news.   No further  surgery necessary. Let me know you reached her.  Thanks, Dalbert Batman

## 2018-07-07 ENCOUNTER — Telehealth: Payer: Self-pay | Admitting: Oncology

## 2018-07-07 NOTE — Telephone Encounter (Signed)
Scheduled appt per 10/1 sch message - pt daughter is aware of appt date and time.

## 2018-07-14 NOTE — Progress Notes (Signed)
[place]  [phone] [phone]     ID: Veronica Price DOB: Feb 13, 1946  MR#: [id number]  CSN#:[id number]  Patient Care Team: Patient, No Pcp Per as PCP - General (General Practice) Romar Woodrick, [name], MD as Consulting Physician (Oncology) Kahmari Herard, [name], MD as Consulting Physician (Oncology) [name], MD as Consulting Physician (General Surgery) OTHER MD:  CHIEF COMPLAINT: Triple negative breast cancer  CURRENT TREATMENT: Adjuvant radiation pending   INTERVAL HISTORY: Veronica Price returns today for follow up of her triple negative breast cancer, accompanied by her husband and over the phone is her daughter.  Veronica Price underwent right lumpectomy with sentinel lymph node sampling on 06/28/2018 with pathology showing ([id number]): Invasive ductal carcinoma, grade III spanning 2.2 cm. Margins are negative. 3 right axillary sentinel lymph nodes were negative for carcinoma (0/3). Repeat prognostic panel was again triple negative.    REVIEW OF SYSTEMS: Veronica Price reports that she tolerated her surgery well. She only took pain medication twice. She denies bleeding or fever after surgery. She feels a little soreness and sensitivty in the right axilla. She also had some shooting pains in the right breast. She has not yet met with a radiation doctor yet.  She denies unusual headaches, visual changes, nausea, vomiting, or dizziness. There has been no unusual cough, phlegm production, or pleurisy. There has been no change in bowel or bladder habits. She denies unexplained fatigue or unexplained weight loss, bleeding, rash, or fever. A detailed review of systems was otherwise stable.      HISTORY OF CURRENT ILLNESS: Veronica Price initially palpated a mass to her right breast in February 2019.   She brought her to her primary care physician's attention and was set up for bilateral diagnostic mammography with tomography and right breast ultrasonography on  11/25/2017 showing: a 3.2 x 3.7 cm lobulated mass in the UOQ of the right breast; there was a 3 cm calcified retroareolar mass in the left breast. On right breast ultrasonography there was a complex mostly solid 2.4 x 3.1 cm mass at 10 o'clock and multiple enlarged right axillary lymph nodes, the largest measuring 1.6 x 2.4.  Accordingly on 11/27/2017 the patient proceeded to biopsy of the right breast area in question, as well as an attempted axillary node biopsy. The pathology from this procedure showed ([id number]):  high grade infiltrating ductal carcinoma with no lymphovascular invasion.  The attempted axillary node biopsy showed fat, no nodal tissue.  Prognostic indicators were: estrogen and progesterone receptor negative,. Proliferation marker Ki67 at >80%. HER2 negative by immunohistochemistry at 0%  CT scans of the chest abdomen and pelvis completed on 12/10/2017 showed A complex solid and cystic 3.5 cm right breast mass with several bilateral axillary lymph nodes, the largest in the right axilla measured 12 mm. There was no evidence of visceral metastatic disease.   She had a bone scan on 12/10/2017 that showed two areas of increased uptake overlying the lateral aspects of the T11 vertebrae. Review of the CT scan of the chest revealed sclerosis of the T11 pedicles concerning for bone metastases.  However subsequent spinal MRI (I do not have that report) read this out as arthritis, not a metastatic deposit  The patient had a echocardiogram completed on 3/72019 with results showing: left ventricular ejection fraction of 55-60% with mild concentric hypertrophy. There was mild to moderate mitral and mild tricuspid regurgitation with mild pulmonary hypertension. The aortic valve was mildly thickened with trace aortic regurgitation.   The patient's subsequent  history is as detailed below.  PAST MEDICAL HISTORY: Past Medical History:  Diagnosis Date  . Arthritis   . Breast cancer ([place])    Right breast   . Cataract    "early" per patient  . GERD (gastroesophageal reflux disease)    occasionaly tums    PAST SURGICAL HISTORY: Past Surgical History:  Procedure Laterality Date  . ABDOMINAL HYSTERECTOMY     Total hysterectomy in 2002  . BREAST LUMPECTOMY WITH RADIOACTIVE SEED AND SENTINEL LYMPH NODE BIOPSY Right 06/28/2018   Procedure: BREAST LUMPECTOMY WITH RADIOACTIVE SEED AND SENTINEL LYMPH NODE BIOPSY;  Surgeon: [name], MD;  Location: [facility];  Service: General;  Laterality: Right;  . PORTACATH PLACEMENT N/A 12/29/2017   Procedure: INSERTION PORT-A-CATH LEFT SUBCLAVIAN;  Surgeon: [name], MD;  Location: [facility];  Service: General;  Laterality: N/A;    FAMILY HISTORY Family History  Problem Relation Age of Onset  . Diabetes Father   . Heart attack Father    She notes that her father died from a possible MI in his early 75's Her mother is still alive and will be 74 March 2019!  The patient has 2 brothers and 2 sisters. Her brother had prostate cancer possibly agent orange related. She has a cousin who was recently diagnosed with stomach cancer. Her maternal grandmother died from colon cancer. She has a maternal uncle with bone cancer. She denies family hx of breast or ovarian cancer.    GYNECOLOGIC HISTORY:  Menarche: 72 years old Age at first live birth: 72 years old [place] P3 LMP: at age 61 Contraceptive: OCP HRT   Hysterectomy? 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 She also worked at [employer]. She lives at home with her husband, Gwyndolyn Saxon who is a retired Building control surveyor, her husband works part time driving an adult day care bus. Her daughter, [name] is a Multimedia programmer. Her daughter [name] lives in [place], [place] and works as a Therapist, sports for the [place]. Her son Adea Geisel, lives in [place], and works as a Administrator.  The patient has 3 grandchildren  and no great-grandchildren. She is of baptist faith.    ADVANCED DIRECTIVES:    HEALTH MAINTENANCE: Social History   Tobacco Use  . Smoking status: Never Smoker  . Smokeless tobacco: Never Used  Substance Use Topics  . Alcohol use: Never    Frequency: Never  . Drug use: Never     Colonoscopy: UTD; last year (2018)  PAP:  Bone density:    Allergies  Allergen Reactions  . Prednisone Swelling and Other (See Comments)    " I think it was this that made my tongue swell "    Current Outpatient Medications  Medication Sig Dispense Refill  . ciprofloxacin (CIPRO) 500 MG tablet Take 1 tablet (500 mg total) by mouth 2 (two) times daily. 10 tablet 1  . HYDROcodone-acetaminophen (NORCO) 5-325 MG tablet Take 1-2 tablets by mouth every 6 (six) hours as needed for moderate pain or severe pain. 20 tablet 0  . LORazepam (ATIVAN) 0.5 MG tablet Take 1 tablet (0.5 mg total) by mouth at bedtime as needed (Nausea or vomiting). 30 tablet 0  . naproxen sodium (ALEVE) 220 MG tablet Take 220 mg by mouth daily as needed (for pain).    . prochlorperazine (COMPAZINE) 10 MG tablet Take 10 mg by mouth every 6 (six)  hours as needed for nausea or vomiting.     No current facility-administered medications for this visit.    Facility-Administered Medications Ordered in Other Visits  Medication Dose Route Frequency Provider Last Rate Last Dose  . heparin lock flush 100 unit/mL  250 Units Intracatheter Once Tanji Storrs, [name], MD      . sodium chloride flush (NS) 0.9 % injection 10 mL  10 mL Intracatheter Once Knowledge Escandon, [name], MD        OBJECTIVE: Middle-aged African-American woman who appears stated age  Vitals:   07/16/18 1015  BP: (!) 146/92  Pulse: 68  Resp: 18  Temp: 97.9 F (36.6 C)  SpO2: 100%     Body mass index is 30.56 kg/m.   Wt Readings from Last 3 Encounters:  07/16/18 167 lb 1.6 oz (75.8 kg)  06/28/18 169 lb 12.1 oz (77 kg)  06/21/18 167 lb 9.6 oz (76 kg)  ECOG FS:1 -  Symptomatic but completely ambulatory  Sclerae unicteric, EOMs intact No cervical or supraclavicular adenopathy Lungs no rales or rhonchi Heart regular rate and rhythm Abd soft, nontender, positive bowel sounds MSK no focal spinal tenderness, no upper extremity lymphedema Neuro: nonfocal, well oriented, appropriate affect Breasts: The right breast is status post recent lumpectomy.  The incision is healing very nicely.  There is no erythema swelling or dehiscence.  The cosmetic result is good.  The left breast is unremarkable.  Both axillae are benign.    LAB RESULTS:  CMP     Component Value Date/Time   NA 143 07/16/2018 0901   K 4.1 07/16/2018 0901   CL 107 07/16/2018 0901   CO2 26 07/16/2018 0901   GLUCOSE 74 07/16/2018 0901   BUN 13 07/16/2018 0901   CREATININE 0.83 07/16/2018 0901   CREATININE 0.80 01/11/2018 0747   CALCIUM 9.6 07/16/2018 0901   PROT 7.1 07/16/2018 0901   ALBUMIN 3.8 07/16/2018 0901   AST 18 07/16/2018 0901   AST 17 01/11/2018 0747   ALT 16 07/16/2018 0901   ALT 16 01/11/2018 0747   ALKPHOS 78 07/16/2018 0901   BILITOT 0.3 07/16/2018 0901   BILITOT 0.6 01/11/2018 0747   GFRNONAA >60 07/16/2018 0901   GFRNONAA >60 01/11/2018 0747   GFRAA >60 07/16/2018 0901   GFRAA >60 01/11/2018 0747    No results found for: TOTALPROTELP, ALBUMINELP, A1GS, A2GS, BETS, BETA2SER, GAMS, MSPIKE, SPEI  No results found for: KPAFRELGTCHN, LAMBDASER, KAPLAMBRATIO  Lab Results  Component Value Date   WBC 4.2 07/16/2018   NEUTROABS 2.0 07/16/2018   HGB 11.5 (L) 07/16/2018   HCT 35.7 (L) 07/16/2018   MCV 103.5 (H) 07/16/2018   PLT 195 07/16/2018    @LASTCHEMISTRY @  No results found for: LABCA2  No components found for: CBJSEG315  No results for input(s): INR in the last 168 hours.  No results found for: LABCA2  No results found for: VVO160  No results found for: VPX106  No results found for: YIR485  No results found for: CA2729  No components found  for: HGQUANT  No results found for: CEA1 / No results found for: CEA1   No results found for: AFPTUMOR  No results found for: CHROMOGRNA  No results found for: PSA1  Appointment on 07/16/2018  Component Date Value Ref Range Status  . Sodium 07/16/2018 143  135 - 145 mmol/L Final  . Potassium 07/16/2018 4.1  3.5 - 5.1 mmol/L Final  . Chloride 07/16/2018 107  98 - 111 mmol/L Final  .  CO2 07/16/2018 26  22 - 32 mmol/L Final  . Glucose, Bld 07/16/2018 74  70 - 99 mg/dL Final  . BUN 07/16/2018 13  8 - 23 mg/dL Final  . Creatinine, Ser 07/16/2018 0.83  0.44 - 1.00 mg/dL Final  . Calcium 07/16/2018 9.6  8.9 - 10.3 mg/dL Final  . Total Protein 07/16/2018 7.1  6.5 - 8.1 g/dL Final  . Albumin 07/16/2018 3.8  3.5 - 5.0 g/dL Final  . AST 07/16/2018 18  15 - 41 U/L Final  . ALT 07/16/2018 16  0 - 44 U/L Final  . Alkaline Phosphatase 07/16/2018 78  38 - 126 U/L Final  . Total Bilirubin 07/16/2018 0.3  0.3 - 1.2 mg/dL Final  . GFR calc non Af Amer 07/16/2018 >60  >60 mL/min Final  . GFR calc Af Amer 07/16/2018 >60  >60 mL/min Final   Comment: (NOTE) The eGFR has been calculated using the CKD EPI equation. This calculation has not been validated in all clinical situations. eGFR's persistently <60 mL/min signify possible Chronic Kidney Disease.   Georgiann Hahn gap 07/16/2018 10  5 - 15 Final   Performed at [facility], [place] [address] [place], [place] [address]  . WBC 07/16/2018 4.2  4.0 - 10.5 K/uL Final  . RBC 07/16/2018 3.45* 3.87 - 5.11 MIL/uL Final  . Hemoglobin 07/16/2018 11.5* 12.0 - 15.0 g/dL Final  . HCT 07/16/2018 35.7* 36.0 - 46.0 % Final  . MCV 07/16/2018 103.5* 80.0 - 100.0 fL Final  . MCH 07/16/2018 33.3  26.0 - 34.0 pg Final  . MCHC 07/16/2018 32.2  30.0 - 36.0 g/dL Final  . RDW 07/16/2018 14.7  11.5 - 15.5 % Final  . Platelets 07/16/2018 195  150 - 400 K/uL Final  . nRBC 07/16/2018 0.0  0.0 - 0.2 % Final  . Neutrophils Relative % 07/16/2018 46  % Final   . Neutro Abs 07/16/2018 2.0  1.7 - 7.7 K/uL Final  . Lymphocytes Relative 07/16/2018 18  % Final  . Lymphs Abs 07/16/2018 0.8  0.7 - 4.0 K/uL Final  . Monocytes Relative 07/16/2018 27  % Final  . Monocytes Absolute 07/16/2018 1.1* 0.1 - 1.0 K/uL Final  . Eosinophils Relative 07/16/2018 8  % Final  . Eosinophils Absolute 07/16/2018 0.3  0.0 - 0.5 K/uL Final  . Basophils Relative 07/16/2018 1  % Final  . Basophils Absolute 07/16/2018 0.0  0.0 - 0.1 K/uL Final  . Immature Granulocytes 07/16/2018 0  % Final  . Abs Immature Granulocytes 07/16/2018 0.01  0.00 - 0.07 K/uL Final   Performed at [facility], [place] [address], [place], [place] [address]    (this displays the last labs from the last 3 days)  No results found for: TOTALPROTELP, ALBUMINELP, A1GS, A2GS, BETS, BETA2SER, GAMS, MSPIKE, SPEI (this displays SPEP labs)  No results found for: KPAFRELGTCHN, LAMBDASER, KAPLAMBRATIO (kappa/lambda light chains)  No results found for: HGBA, HGBA2QUANT, HGBFQUANT, HGBSQUAN (Hemoglobinopathy evaluation)   No results found for: LDH  No results found for: IRON, TIBC, IRONPCTSAT (Iron and TIBC)  No results found for: FERRITIN  Urinalysis No results found for: COLORURINE, APPEARANCEUR, LABSPEC, PHURINE, GLUCOSEU, HGBUR, BILIRUBINUR, KETONESUR, PROTEINUR, UROBILINOGEN, NITRITE, LEUKOCYTESUR   STUDIES: Mr Breast Bilateral W Thayer Cad  Result Date: 06/16/2018 CLINICAL DATA:  72 year old female for follow-up of RIGHT breast cancer, undergoing neoadjuvant therapy. LABS:  None performed today EXAM: BILATERAL BREAST MRI WITH AND WITHOUT CONTRAST TECHNIQUE: Multiplanar, multisequence  MR images of both breasts were obtained prior to and following the intravenous administration of 15 ml of MultiHance. Three-dimensional MR images were rendered by post-processing the original MR data using the DynaCAD thin client. The 3D MR images are interpreted and the findings are  included in the complete MRI report below. COMPARISON:  12/26/2017 MR and 11/25/2017 outside mammogram [facility] Cyphers, [place]) FINDINGS: Breast composition: b. Scattered fibroglandular tissue. Background parenchymal enhancement: Mild Right breast: A 1.7 x 2.1 x 1.5 cm enhancing mass within the posterior UPPER-OUTER RIGHT breast previously measured 3.3 x 2.6 x 2.9 cm. No other suspicious mass or enhancement noted within the RIGHT breast. Left breast: No mass or abnormal enhancement. Lymph nodes: No abnormal appearing lymph nodes. Ancillary findings:  None. IMPRESSION: 1. Treatment response with decreasing size of known RIGHT breast malignancy now measuring 1.7 x 2.1 x 1.5 cm, previously 3.3 x 2.6 x 2.9 cm. 2. No other significant abnormalities noted. RECOMMENDATION: Treatment plan BI-RADS CATEGORY  6: Known biopsy-proven malignancy. Electronically Signed   By: Margarette Canada M.D.   On: 06/16/2018 13:48   Nm Sentinel Node Inj-no Rpt (breast)  Result Date: 06/28/2018 Sulfur colloid was injected by the nuclear medicine technologist for melanoma sentinel node.   Mm Breast Surgical Specimen  Result Date: 06/28/2018 CLINICAL DATA:  Evaluate specimen EXAM: SPECIMEN RADIOGRAPH OF THE RIGHT BREAST COMPARISON:  Previous exam(s). FINDINGS: Status post excision of the right breast. The radioactive seed and biopsy marker clip are present, completely intact, and were marked for pathology. IMPRESSION: Specimen radiograph of the right breast. Electronically Signed   By: Dorise Bullion III M.D   On: 06/28/2018 10:02   Mm Rt Radioactive Seed Loc Mammo Guide  Result Date: 06/25/2018 CLINICAL DATA:  72 year old female for radioactive seed localization of breast cancer prior to RIGHT lumpectomy EXAM: MAMMOGRAPHIC GUIDED RADIOACTIVE SEED LOCALIZATION OF THE RIGHT BREAST COMPARISON:  Previous exam(s). FINDINGS: Patient presents for radioactive seed localization prior to RIGHT lumpectomy. I met with the patient and we discussed  the procedure of seed localization including benefits and alternatives. We discussed the high likelihood of a successful procedure. We discussed the risks of the procedure including infection, bleeding, tissue injury and further surgery. We discussed the low dose of radioactivity involved in the procedure. Informed, written consent was given. The usual time-out protocol was performed immediately prior to the procedure. Using mammographic guidance, sterile technique, 1% lidocaine and an I-125 radioactive seed, the spiral clip and mass was localized using a SUPERIOR approach. The follow-up mammogram images confirm the seed in the expected location located along the MEDIAL aspect of the mass, 4 mm MEDIAL to the spiral biopsy clip and were marked for Dr. Dalbert Batman. Follow-up survey of the patient confirms presence of the radioactive seed. Order number of I-125 seed:  [id number]. Total activity:  0.865 millicuries.  Reference Date: 06/18/2018 The patient tolerated the procedure well and was released from the [facility]. She was given instructions regarding seed removal. IMPRESSION: Radioactive seed localization RIGHT breast. The seed is located along the MEDIAL aspect of the mass, 4 mm MEDIAL to the spiral biopsy clip. Electronically Signed   By: Margarette Canada M.D.   On: 06/25/2018 14:31    ELIGIBLE FOR AVAILABLE RESEARCH PROTOCOL: Consider SWOG [id number] post-op  ASSESSMENT: 72 y.o. [name], [place] woman status post right breast upper outer quadrant biopsy 11/25/2017 for a clinical T2 N2, stage IIIc invasive ductal carcinoma, grade 3, triple negative, with an MIB-1 of 80%.  (1) staging studies:  (a)  chest CT 12/10/2017 scan showed no visceral metastatic disease  (b) bone scan 12/10/2017 showed uptake at T11, with sclerosis.  (c) CA-27-29 on 12/05/2017 was 19.0  (d) spinal MRI in [place] reportedly showed only arthrtis at T11  (2) neoadjuvant chemotherapy consisting of carboplatin/ paclitaxel x 12 starting 01/04/2018,  discontinued 03/15/2018, followed by cyclophosphamide and doxorubicin given every 3 weeks x 4 starting 04/05/2018   (a) carboplatin/paclitaxel changed to carboplatin/gemcitabine after 9 cycles of Taxol/Carboplatin due to neuroapthy  (b) only able to tolerate 1 cycle of Gemcitabine/Carboplatin due to rash  (a) echocardiogram on 04/02/18 demonstrates a LVEF of 60-65%  (3) status post right lumpectomy and sentinel lymph node sampling on 06/28/2018 showing a residual 2.2 cm, grade 3 invasive ductal carcinoma, with negative margins and 0 of 3 sentinel lymph nodes removed involved  (4) adjuvant radiation to follow: Patient is being referred to the [facility] group in [facility]  (5) she is a candidate for the pembrolizumab study (port was left in place)   PLAN:   Nallely did well with her chemotherapy and tolerated surgery remarkably well.  She did have residual disease in the breast, although thankfully the lymph nodes were negative.  She now needs to proceed to adjuvant radiation.  We are going to refer her to the [facility] group in [facility] and they should be contacting her within the next 2 weeks or so to proceed with that therapy.  I have alerted our research nurses that she qualifies for the pembrolizumab study.  I have discussed this in a preliminary fashion with the patient.  I am hopeful she will want to participate I think she would greatly benefit from this  Tentatively I have made her a return appointment with me for January.  She should be done with radiation at that time and we could be starting the [facility] then assuming she does enroll.  She knows to call for any other issues that may develop before the next visit.    Jaycelynn Knickerbocker, [name], MD  07/16/18 10:53 AM Medical Oncology and Hematology [facility] [address] [place], [place] [address] Tel. [phone]    Fax. [phone]  Alice Rieger, am acting as scribe for Chauncey Cruel MD.  I,  Lurline Del MD, have reviewed the above documentation for accuracy and completeness, and I agree with the above.

## 2018-07-16 ENCOUNTER — Inpatient Hospital Stay (HOSPITAL_BASED_OUTPATIENT_CLINIC_OR_DEPARTMENT_OTHER): Payer: Medicare Other | Admitting: Oncology

## 2018-07-16 ENCOUNTER — Encounter: Payer: Self-pay | Admitting: *Deleted

## 2018-07-16 ENCOUNTER — Telehealth: Payer: Self-pay | Admitting: *Deleted

## 2018-07-16 ENCOUNTER — Inpatient Hospital Stay: Payer: Medicare Other | Attending: Nurse Practitioner

## 2018-07-16 VITALS — BP 146/92 | HR 68 | Temp 97.9°F | Resp 18 | Ht 62.0 in | Wt 167.1 lb

## 2018-07-16 DIAGNOSIS — Z171 Estrogen receptor negative status [ER-]: Secondary | ICD-10-CM | POA: Diagnosis not present

## 2018-07-16 DIAGNOSIS — C50411 Malignant neoplasm of upper-outer quadrant of right female breast: Secondary | ICD-10-CM

## 2018-07-16 DIAGNOSIS — Z79899 Other long term (current) drug therapy: Secondary | ICD-10-CM | POA: Diagnosis not present

## 2018-07-16 LAB — CBC WITH DIFFERENTIAL/PLATELET
Abs Immature Granulocytes: 0.01 10*3/uL (ref 0.00–0.07)
Basophils Absolute: 0 10*3/uL (ref 0.0–0.1)
Basophils Relative: 1 %
Eosinophils Absolute: 0.3 10*3/uL (ref 0.0–0.5)
Eosinophils Relative: 8 %
HCT: 35.7 % — ABNORMAL LOW (ref 36.0–46.0)
Hemoglobin: 11.5 g/dL — ABNORMAL LOW (ref 12.0–15.0)
Immature Granulocytes: 0 %
Lymphocytes Relative: 18 %
Lymphs Abs: 0.8 10*3/uL (ref 0.7–4.0)
MCH: 33.3 pg (ref 26.0–34.0)
MCHC: 32.2 g/dL (ref 30.0–36.0)
MCV: 103.5 fL — ABNORMAL HIGH (ref 80.0–100.0)
Monocytes Absolute: 1.1 10*3/uL — ABNORMAL HIGH (ref 0.1–1.0)
Monocytes Relative: 27 %
Neutro Abs: 2 10*3/uL (ref 1.7–7.7)
Neutrophils Relative %: 46 %
Platelets: 195 10*3/uL (ref 150–400)
RBC: 3.45 MIL/uL — ABNORMAL LOW (ref 3.87–5.11)
RDW: 14.7 % (ref 11.5–15.5)
WBC: 4.2 10*3/uL (ref 4.0–10.5)
nRBC: 0 % (ref 0.0–0.2)

## 2018-07-16 LAB — COMPREHENSIVE METABOLIC PANEL
ALT: 16 U/L (ref 0–44)
AST: 18 U/L (ref 15–41)
Albumin: 3.8 g/dL (ref 3.5–5.0)
Alkaline Phosphatase: 78 U/L (ref 38–126)
Anion gap: 10 (ref 5–15)
BUN: 13 mg/dL (ref 8–23)
CO2: 26 mmol/L (ref 22–32)
Calcium: 9.6 mg/dL (ref 8.9–10.3)
Chloride: 107 mmol/L (ref 98–111)
Creatinine, Ser: 0.83 mg/dL (ref 0.44–1.00)
GFR calc Af Amer: >60 mL/min (ref >60)
GFR calc non Af Amer: >60 mL/min (ref >60)
Glucose, Bld: 74 mg/dL (ref 70–99)
Potassium: 4.1 mmol/L (ref 3.5–5.1)
Sodium: 143 mmol/L (ref 135–145)
Total Bilirubin: 0.3 mg/dL (ref 0.3–1.2)
Total Protein: 7.1 g/dL (ref 6.5–8.1)

## 2018-07-16 NOTE — Research (Unsigned)
P5940: A RANDOMIZED, PHASE III TRIAL TO EVALUATE THE EFFICACY AND SAFETY OF [id number] AS ADJUVANT THERAPY FOR TRIPLE RECEPTOR-NEGATIVE BREAST CANCER WITH > 1 CM RESIDUAL INVASIVE CANCER OR POSITIVE LYMPH NODES (>pN37mc) AFTER NEOADJUVANT CHEMOTHERAPY.  Dr. MJana Hakimreferred patient for the above clinical trial. Met with patient and herhusband in exam room for 15 minutes. Patient states Dr. MJana Hakimexplained the study to them and she is interested in learning more about this study. Providedpatient with a copy of consent form for [id number] protocol version date 05/04/18 along with authorization to release medical information form. Also gave her mybusiness card and our clinical research informational brochure. Briefly reviewed the study, informing patient of the purpose, some of the possible risks and benefits of the study. Informed patient that participation is completely voluntary. Explained the study randomizes participants with a 50% chance of being in either the investigational arm with study drug or to the observation arm. Outlined the visit schedule for both arms.  Patient reports [facility] [place] Informed patient that participants can enroll on this study before, during or after Radiation treatments but since she plans to get XRT at another facility then we would need to wait for her Radiation to be complete and she would be required to come back to our clinic for any study visits. Patient verbalized understanding and denied any questions at this time. Obtained patient's permission to review her medical record for eligibility criteria. Patient agreed to read the consent form andfor research nurse to call her when she is getting closer to completing her Radiation treatments. Encouraged patient to call research nurse if any questions or concerns prior to my call.  Thanked patient very much for her time and interest in this study.   CFoye Spurling BSN, RN Clinical Research Nurse 07/16/2018 12:00 PM

## 2018-07-22 ENCOUNTER — Ambulatory Visit: Payer: Medicare Other | Admitting: Adult Health

## 2018-07-22 ENCOUNTER — Other Ambulatory Visit: Payer: Medicare Other

## 2018-07-30 ENCOUNTER — Telehealth: Payer: Self-pay | Admitting: *Deleted

## 2018-07-30 NOTE — Telephone Encounter (Signed)
S1418 Clinical Trial: Called patient to follow up on her interest in the S1418 clinical trial and to see if she has started Radiation treatments yet. Patient was not at home but I spoke with her daughter, Veronica Price, about the study.  Veronica Price states patient is starting Radiation treatments this Monday on 08/02/18 at [facility] in [place].  She thinks she is going to have about 4 to 6 weeks of Radiation. Informed Veronica Price the deadline to enroll patient on this study is 09/26/18.  Patient would need to come back to [facility] for Lab/ MD visit and meet with Clinical Research nurse to sign consent form prior to 12/22.  Also briefly explained the purpose and different arms of this study.  Informed her what each arm would require of patient. Encouraged her to read the consent form that was provided to her mother on her last visit. Veronica Price verbalized understanding and says she thinks her mother is interested in participating in this study.  Informed Veronica Price I will call back next week to find out planned Radiation end date and ask if her mother is interested in returning before 09/26/18 to enroll on this study.  Rosezetta Schlatter very much for her time today and will plan to follow up next week.  Foye Spurling, BSN, RN Clinical Research Nurse 07/30/2018 2:36 PM

## 2018-08-03 DIAGNOSIS — Z171 Estrogen receptor negative status [ER-]: Secondary | ICD-10-CM | POA: Diagnosis not present

## 2018-08-03 DIAGNOSIS — Z888 Allergy status to other drugs, medicaments and biological substances status: Secondary | ICD-10-CM | POA: Diagnosis not present

## 2018-08-03 DIAGNOSIS — Z8042 Family history of malignant neoplasm of prostate: Secondary | ICD-10-CM | POA: Diagnosis not present

## 2018-08-03 DIAGNOSIS — Z800 Family history of malignant neoplasm of digestive organs: Secondary | ICD-10-CM | POA: Diagnosis not present

## 2018-08-03 DIAGNOSIS — C50411 Malignant neoplasm of upper-outer quadrant of right female breast: Secondary | ICD-10-CM | POA: Diagnosis not present

## 2018-08-03 DIAGNOSIS — Z9221 Personal history of antineoplastic chemotherapy: Secondary | ICD-10-CM | POA: Diagnosis not present

## 2018-08-05 ENCOUNTER — Telehealth: Payer: Self-pay | Admitting: *Deleted

## 2018-08-05 DIAGNOSIS — Z171 Estrogen receptor negative status [ER-]: Secondary | ICD-10-CM | POA: Diagnosis not present

## 2018-08-05 DIAGNOSIS — C50411 Malignant neoplasm of upper-outer quadrant of right female breast: Secondary | ICD-10-CM | POA: Diagnosis not present

## 2018-08-05 DIAGNOSIS — Z9889 Other specified postprocedural states: Secondary | ICD-10-CM | POA: Diagnosis not present

## 2018-08-05 DIAGNOSIS — Z9221 Personal history of antineoplastic chemotherapy: Secondary | ICD-10-CM | POA: Diagnosis not present

## 2018-08-05 NOTE — Telephone Encounter (Signed)
Research note: Called patient to follow up on her interest in the S1418 clinical trial and to find out if she knows when she will complete her radiation treatments at [facility]  Patient states she just had her CT scan for Radiation today and got her tatoos.  She does not have a schedule yet and says it might be another week before she actually starts on Radiation and she is not sure how many weeks it will last.  Patient says she is still interested in the clinical trial but she is not sure she will want to commit to driving here from her home in [place] for the next year if she does go on the trial.  Reminded patient that if she were chosen for the intervention/drug arm, then she would be asked to come every 3 weeks for the first year and if she were chosen for the observation arm it would be every 3 months for the first year. Then both arms have visits every 6 months for the next 4 years after that. Encouraged patient to think about it some more and discuss with her family. Informed her the deadline for her to enroll on this study is 09/26/18, so she would have to come back to our clinic to sign consent form, have labs and see Dr. Jana Hakim prior to 09/26/18.   She verbalized understanding.  Patient agreed for research nurse to call her back in another week to see if she has her radiation schedule yet.  Thanked patient very much for her time today and for considering this study.  Foye Spurling, BSN, RN Clinical Research Nurse 08/05/2018 1:43 PM

## 2018-08-06 DIAGNOSIS — Z9221 Personal history of antineoplastic chemotherapy: Secondary | ICD-10-CM | POA: Diagnosis not present

## 2018-08-06 DIAGNOSIS — Z171 Estrogen receptor negative status [ER-]: Secondary | ICD-10-CM | POA: Diagnosis not present

## 2018-08-06 DIAGNOSIS — C50411 Malignant neoplasm of upper-outer quadrant of right female breast: Secondary | ICD-10-CM | POA: Diagnosis not present

## 2018-08-06 DIAGNOSIS — Z9889 Other specified postprocedural states: Secondary | ICD-10-CM | POA: Diagnosis not present

## 2018-08-13 ENCOUNTER — Telehealth: Payer: Self-pay | Admitting: *Deleted

## 2018-08-13 DIAGNOSIS — C50411 Malignant neoplasm of upper-outer quadrant of right female breast: Secondary | ICD-10-CM | POA: Diagnosis not present

## 2018-08-13 DIAGNOSIS — Z9889 Other specified postprocedural states: Secondary | ICD-10-CM | POA: Diagnosis not present

## 2018-08-13 DIAGNOSIS — Z9221 Personal history of antineoplastic chemotherapy: Secondary | ICD-10-CM | POA: Diagnosis not present

## 2018-08-13 DIAGNOSIS — Z171 Estrogen receptor negative status [ER-]: Secondary | ICD-10-CM | POA: Diagnosis not present

## 2018-08-13 NOTE — Telephone Encounter (Signed)
S1418 Clinical Trial: Called patient to follow up on her Radiation schedule at [facility].  She states she has appointment today for another CT scan and she thinks she is starting treatments on Monday 11/11 but does not know for how long.  Patient says she is still undecided about this study.  She wants to do it but is unsure if she wants to do it bad enough to drive here from [place] every 3 weeks if she were randomized to the treatment arm.   Informed patient that we may not be able to enroll her if her radiation is not completed prior to 09/26/18 enrollment deadline.  Encouraged her to continue to think about it because it may still be possible.  She agreed for research nurse to call her again next week to follow up about her radiation schedule. Thanked patient for her time today.  Foye Spurling, BSN, RN Clinical Research Nurse 08/13/2018 10:03 AM

## 2018-08-16 DIAGNOSIS — C50411 Malignant neoplasm of upper-outer quadrant of right female breast: Secondary | ICD-10-CM | POA: Diagnosis not present

## 2018-08-16 DIAGNOSIS — Z9221 Personal history of antineoplastic chemotherapy: Secondary | ICD-10-CM | POA: Diagnosis not present

## 2018-08-16 DIAGNOSIS — Z9889 Other specified postprocedural states: Secondary | ICD-10-CM | POA: Diagnosis not present

## 2018-08-16 DIAGNOSIS — Z171 Estrogen receptor negative status [ER-]: Secondary | ICD-10-CM | POA: Diagnosis not present

## 2018-08-17 DIAGNOSIS — C50411 Malignant neoplasm of upper-outer quadrant of right female breast: Secondary | ICD-10-CM | POA: Diagnosis not present

## 2018-08-17 DIAGNOSIS — Z171 Estrogen receptor negative status [ER-]: Secondary | ICD-10-CM | POA: Diagnosis not present

## 2018-08-17 DIAGNOSIS — Z9889 Other specified postprocedural states: Secondary | ICD-10-CM | POA: Diagnosis not present

## 2018-08-17 DIAGNOSIS — Z9221 Personal history of antineoplastic chemotherapy: Secondary | ICD-10-CM | POA: Diagnosis not present

## 2018-08-18 DIAGNOSIS — C50411 Malignant neoplasm of upper-outer quadrant of right female breast: Secondary | ICD-10-CM | POA: Diagnosis not present

## 2018-08-18 DIAGNOSIS — Z9889 Other specified postprocedural states: Secondary | ICD-10-CM | POA: Diagnosis not present

## 2018-08-18 DIAGNOSIS — Z9221 Personal history of antineoplastic chemotherapy: Secondary | ICD-10-CM | POA: Diagnosis not present

## 2018-08-18 DIAGNOSIS — Z171 Estrogen receptor negative status [ER-]: Secondary | ICD-10-CM | POA: Diagnosis not present

## 2018-08-19 DIAGNOSIS — Z9889 Other specified postprocedural states: Secondary | ICD-10-CM | POA: Diagnosis not present

## 2018-08-19 DIAGNOSIS — Z9221 Personal history of antineoplastic chemotherapy: Secondary | ICD-10-CM | POA: Diagnosis not present

## 2018-08-19 DIAGNOSIS — C50411 Malignant neoplasm of upper-outer quadrant of right female breast: Secondary | ICD-10-CM | POA: Diagnosis not present

## 2018-08-19 DIAGNOSIS — Z171 Estrogen receptor negative status [ER-]: Secondary | ICD-10-CM | POA: Diagnosis not present

## 2018-08-20 DIAGNOSIS — C50411 Malignant neoplasm of upper-outer quadrant of right female breast: Secondary | ICD-10-CM | POA: Diagnosis not present

## 2018-08-20 DIAGNOSIS — Z171 Estrogen receptor negative status [ER-]: Secondary | ICD-10-CM | POA: Diagnosis not present

## 2018-08-20 DIAGNOSIS — Z9889 Other specified postprocedural states: Secondary | ICD-10-CM | POA: Diagnosis not present

## 2018-08-20 DIAGNOSIS — Z9221 Personal history of antineoplastic chemotherapy: Secondary | ICD-10-CM | POA: Diagnosis not present

## 2018-08-23 ENCOUNTER — Telehealth: Payer: Self-pay | Admitting: *Deleted

## 2018-08-23 DIAGNOSIS — Z171 Estrogen receptor negative status [ER-]: Secondary | ICD-10-CM | POA: Diagnosis not present

## 2018-08-23 DIAGNOSIS — Z9221 Personal history of antineoplastic chemotherapy: Secondary | ICD-10-CM | POA: Diagnosis not present

## 2018-08-23 DIAGNOSIS — C50411 Malignant neoplasm of upper-outer quadrant of right female breast: Secondary | ICD-10-CM | POA: Diagnosis not present

## 2018-08-23 DIAGNOSIS — Z9889 Other specified postprocedural states: Secondary | ICD-10-CM | POA: Diagnosis not present

## 2018-08-23 NOTE — Telephone Encounter (Signed)
Patient returned call and reports she has started Radiation and her last day will be 09/15/18.  Informed patient that if she wants to enroll on the S1418 study, then we will make appointment for her to come into our clinic to sign consent, have screening lab/ MD visit for the study.  This will need to be done after radiation but before her enrollment deadline of 12/22. Patient states she is still undecided and wants to discuss it with her husband.  Her hesitation is related to having to drive more often from her home in [place] to our clinic.  Her husband can only drive her on Mondays and Tuesdays as these are his days off work.  Informed patient we can try our best to plan her visits for Mondays or Tuesdays. Reminded her the study schedule would be every 3 weeks for one year if she were randomized to the group that gets the drug or every 3 months if she were randomized to the observation group.  Encouraged patient to discuss with her husband and call back if she reaches a decision so we can get her scheduled to see Dr. Jana Hakim.  Patient agreed for this research nurse to call her back again next week if I do not hear from her by then.  Thanked patient very much for her time.  Foye Spurling, BSN, RN Clinical Research Nurse 08/23/2018 11:50 AM

## 2018-08-23 NOTE — Telephone Encounter (Signed)
SWOG [phone] Clinical Trial Called patient to follow up on her radiation schedule and interest in this study.  LVM for patient to return research nurse's call when she can.  Foye Spurling, BSN, RN Clinical Research Nurse 08/23/2018 11:03 AM

## 2018-08-24 DIAGNOSIS — Z9889 Other specified postprocedural states: Secondary | ICD-10-CM | POA: Diagnosis not present

## 2018-08-24 DIAGNOSIS — Z171 Estrogen receptor negative status [ER-]: Secondary | ICD-10-CM | POA: Diagnosis not present

## 2018-08-24 DIAGNOSIS — Z9221 Personal history of antineoplastic chemotherapy: Secondary | ICD-10-CM | POA: Diagnosis not present

## 2018-08-24 DIAGNOSIS — C50411 Malignant neoplasm of upper-outer quadrant of right female breast: Secondary | ICD-10-CM | POA: Diagnosis not present

## 2018-08-25 DIAGNOSIS — Z9221 Personal history of antineoplastic chemotherapy: Secondary | ICD-10-CM | POA: Diagnosis not present

## 2018-08-25 DIAGNOSIS — Z171 Estrogen receptor negative status [ER-]: Secondary | ICD-10-CM | POA: Diagnosis not present

## 2018-08-25 DIAGNOSIS — Z9889 Other specified postprocedural states: Secondary | ICD-10-CM | POA: Diagnosis not present

## 2018-08-25 DIAGNOSIS — C50411 Malignant neoplasm of upper-outer quadrant of right female breast: Secondary | ICD-10-CM | POA: Diagnosis not present

## 2018-08-26 DIAGNOSIS — Z9889 Other specified postprocedural states: Secondary | ICD-10-CM | POA: Diagnosis not present

## 2018-08-26 DIAGNOSIS — Z171 Estrogen receptor negative status [ER-]: Secondary | ICD-10-CM | POA: Diagnosis not present

## 2018-08-26 DIAGNOSIS — Z9221 Personal history of antineoplastic chemotherapy: Secondary | ICD-10-CM | POA: Diagnosis not present

## 2018-08-26 DIAGNOSIS — C50411 Malignant neoplasm of upper-outer quadrant of right female breast: Secondary | ICD-10-CM | POA: Diagnosis not present

## 2018-08-27 DIAGNOSIS — C50411 Malignant neoplasm of upper-outer quadrant of right female breast: Secondary | ICD-10-CM | POA: Diagnosis not present

## 2018-08-27 DIAGNOSIS — Z171 Estrogen receptor negative status [ER-]: Secondary | ICD-10-CM | POA: Diagnosis not present

## 2018-08-27 DIAGNOSIS — Z9889 Other specified postprocedural states: Secondary | ICD-10-CM | POA: Diagnosis not present

## 2018-08-27 DIAGNOSIS — Z9221 Personal history of antineoplastic chemotherapy: Secondary | ICD-10-CM | POA: Diagnosis not present

## 2018-08-30 ENCOUNTER — Telehealth: Payer: Self-pay | Admitting: *Deleted

## 2018-08-30 DIAGNOSIS — Z9221 Personal history of antineoplastic chemotherapy: Secondary | ICD-10-CM | POA: Diagnosis not present

## 2018-08-30 DIAGNOSIS — C50411 Malignant neoplasm of upper-outer quadrant of right female breast: Secondary | ICD-10-CM | POA: Diagnosis not present

## 2018-08-30 DIAGNOSIS — Z9889 Other specified postprocedural states: Secondary | ICD-10-CM | POA: Diagnosis not present

## 2018-08-30 DIAGNOSIS — Z171 Estrogen receptor negative status [ER-]: Secondary | ICD-10-CM | POA: Diagnosis not present

## 2018-08-30 NOTE — Telephone Encounter (Signed)
Attempted to call patient twice today to follow up on the [place] with husband this morning who informed patient was at her Radiation Treatment.  Called back this afternoon and there is no answer.  Will try again tomorrow.  Foye Spurling, BSN, RN Clinical Research Nurse 08/30/2018 4:48 PM

## 2018-08-31 DIAGNOSIS — Z9889 Other specified postprocedural states: Secondary | ICD-10-CM | POA: Diagnosis not present

## 2018-08-31 DIAGNOSIS — Z9221 Personal history of antineoplastic chemotherapy: Secondary | ICD-10-CM | POA: Diagnosis not present

## 2018-08-31 DIAGNOSIS — Z171 Estrogen receptor negative status [ER-]: Secondary | ICD-10-CM | POA: Diagnosis not present

## 2018-08-31 DIAGNOSIS — C50411 Malignant neoplasm of upper-outer quadrant of right female breast: Secondary | ICD-10-CM | POA: Diagnosis not present

## 2018-09-01 ENCOUNTER — Telehealth: Payer: Self-pay | Admitting: *Deleted

## 2018-09-01 DIAGNOSIS — Z9889 Other specified postprocedural states: Secondary | ICD-10-CM | POA: Diagnosis not present

## 2018-09-01 DIAGNOSIS — C50411 Malignant neoplasm of upper-outer quadrant of right female breast: Secondary | ICD-10-CM | POA: Diagnosis not present

## 2018-09-01 DIAGNOSIS — Z9221 Personal history of antineoplastic chemotherapy: Secondary | ICD-10-CM | POA: Diagnosis not present

## 2018-09-01 DIAGNOSIS — Z171 Estrogen receptor negative status [ER-]: Secondary | ICD-10-CM | POA: Diagnosis not present

## 2018-09-01 NOTE — Telephone Encounter (Signed)
LVM for patient to follow up on her interest in the S1418 clinical trial.  Asked patient to return call to research nurse when able as we will need to get her scheduled back at our clinic for consent, lab and MD before 09/24/18.   Foye Spurling, BSN, RN Clinical Research Nurse 09/01/2018 2:54 PM

## 2018-09-06 ENCOUNTER — Telehealth: Payer: Self-pay | Admitting: *Deleted

## 2018-09-06 DIAGNOSIS — Z9889 Other specified postprocedural states: Secondary | ICD-10-CM | POA: Diagnosis not present

## 2018-09-06 DIAGNOSIS — Z9221 Personal history of antineoplastic chemotherapy: Secondary | ICD-10-CM | POA: Diagnosis not present

## 2018-09-06 DIAGNOSIS — C50411 Malignant neoplasm of upper-outer quadrant of right female breast: Secondary | ICD-10-CM | POA: Diagnosis not present

## 2018-09-06 DIAGNOSIS — Z171 Estrogen receptor negative status [ER-]: Secondary | ICD-10-CM | POA: Diagnosis not present

## 2018-09-06 NOTE — Telephone Encounter (Signed)
P1031 Study; Called patient to follow up on her interest in this study.  Patient states she has decided to enroll and is willing to return to our clinic for Lab/MD and Research nurse consent visit before the enrollment deadline of 09/26/18.  Patient requests appointments in morning on Mondays or Tuesdays as her husband is off work those days and can drive.  She finishes Radiation Tx next week on 09/15/18 and wants to wait until after that is complete.  Informed patient research nurse will check with Dr. Jana Hakim and call her back with date/ time.  Thanked her for her time and willingness to participate.  Foye Spurling, BSN, RN Clinical Research Nurse 09/06/2018 4:29 PM

## 2018-09-07 ENCOUNTER — Telehealth: Payer: Self-pay | Admitting: Oncology

## 2018-09-07 DIAGNOSIS — C50411 Malignant neoplasm of upper-outer quadrant of right female breast: Secondary | ICD-10-CM | POA: Diagnosis not present

## 2018-09-07 DIAGNOSIS — Z171 Estrogen receptor negative status [ER-]: Secondary | ICD-10-CM | POA: Diagnosis not present

## 2018-09-07 DIAGNOSIS — Z9889 Other specified postprocedural states: Secondary | ICD-10-CM | POA: Diagnosis not present

## 2018-09-07 DIAGNOSIS — Z9221 Personal history of antineoplastic chemotherapy: Secondary | ICD-10-CM | POA: Diagnosis not present

## 2018-09-07 NOTE — Telephone Encounter (Signed)
Scheduled appt per 12/3 sch message - left message for patient with appt date and time and sent reminder letter in the mail .

## 2018-09-08 DIAGNOSIS — Z9221 Personal history of antineoplastic chemotherapy: Secondary | ICD-10-CM | POA: Diagnosis not present

## 2018-09-08 DIAGNOSIS — Z171 Estrogen receptor negative status [ER-]: Secondary | ICD-10-CM | POA: Diagnosis not present

## 2018-09-08 DIAGNOSIS — Z9889 Other specified postprocedural states: Secondary | ICD-10-CM | POA: Diagnosis not present

## 2018-09-08 DIAGNOSIS — C50411 Malignant neoplasm of upper-outer quadrant of right female breast: Secondary | ICD-10-CM | POA: Diagnosis not present

## 2018-09-09 DIAGNOSIS — Z9889 Other specified postprocedural states: Secondary | ICD-10-CM | POA: Diagnosis not present

## 2018-09-09 DIAGNOSIS — C50411 Malignant neoplasm of upper-outer quadrant of right female breast: Secondary | ICD-10-CM | POA: Diagnosis not present

## 2018-09-09 DIAGNOSIS — Z9221 Personal history of antineoplastic chemotherapy: Secondary | ICD-10-CM | POA: Diagnosis not present

## 2018-09-09 DIAGNOSIS — Z171 Estrogen receptor negative status [ER-]: Secondary | ICD-10-CM | POA: Diagnosis not present

## 2018-09-10 DIAGNOSIS — C50411 Malignant neoplasm of upper-outer quadrant of right female breast: Secondary | ICD-10-CM | POA: Diagnosis not present

## 2018-09-10 DIAGNOSIS — Z9221 Personal history of antineoplastic chemotherapy: Secondary | ICD-10-CM | POA: Diagnosis not present

## 2018-09-10 DIAGNOSIS — Z9889 Other specified postprocedural states: Secondary | ICD-10-CM | POA: Diagnosis not present

## 2018-09-10 DIAGNOSIS — Z171 Estrogen receptor negative status [ER-]: Secondary | ICD-10-CM | POA: Diagnosis not present

## 2018-09-13 DIAGNOSIS — Z171 Estrogen receptor negative status [ER-]: Secondary | ICD-10-CM | POA: Diagnosis not present

## 2018-09-13 DIAGNOSIS — C50411 Malignant neoplasm of upper-outer quadrant of right female breast: Secondary | ICD-10-CM | POA: Diagnosis not present

## 2018-09-13 DIAGNOSIS — Z9889 Other specified postprocedural states: Secondary | ICD-10-CM | POA: Diagnosis not present

## 2018-09-13 DIAGNOSIS — Z9221 Personal history of antineoplastic chemotherapy: Secondary | ICD-10-CM | POA: Diagnosis not present

## 2018-09-14 ENCOUNTER — Telehealth: Payer: Self-pay | Admitting: *Deleted

## 2018-09-14 DIAGNOSIS — C50411 Malignant neoplasm of upper-outer quadrant of right female breast: Secondary | ICD-10-CM | POA: Diagnosis not present

## 2018-09-14 DIAGNOSIS — Z171 Estrogen receptor negative status [ER-]: Secondary | ICD-10-CM | POA: Diagnosis not present

## 2018-09-14 DIAGNOSIS — Z9221 Personal history of antineoplastic chemotherapy: Secondary | ICD-10-CM | POA: Diagnosis not present

## 2018-09-14 DIAGNOSIS — Z9889 Other specified postprocedural states: Secondary | ICD-10-CM | POA: Diagnosis not present

## 2018-09-14 NOTE — Telephone Encounter (Signed)
Left VM for patient to confirm her appointment next week on Tuesday 12/17.  Asked her to come in at 10:30 am to meet with research nurse first to review and sign consent for the S1418 study prior to lab/MD appointment.  Asked patient to return call to confirm.   Foye Spurling, BSN, RN Clinical Research Nurse 09/14/2018 3:48 PM

## 2018-09-15 DIAGNOSIS — Z9889 Other specified postprocedural states: Secondary | ICD-10-CM | POA: Diagnosis not present

## 2018-09-15 DIAGNOSIS — Z9221 Personal history of antineoplastic chemotherapy: Secondary | ICD-10-CM | POA: Diagnosis not present

## 2018-09-15 DIAGNOSIS — C50411 Malignant neoplasm of upper-outer quadrant of right female breast: Secondary | ICD-10-CM | POA: Diagnosis not present

## 2018-09-15 DIAGNOSIS — Z171 Estrogen receptor negative status [ER-]: Secondary | ICD-10-CM | POA: Diagnosis not present

## 2018-09-17 ENCOUNTER — Telehealth: Payer: Self-pay | Admitting: *Deleted

## 2018-09-17 DIAGNOSIS — C50411 Malignant neoplasm of upper-outer quadrant of right female breast: Secondary | ICD-10-CM

## 2018-09-17 DIAGNOSIS — Z171 Estrogen receptor negative status [ER-]: Principal | ICD-10-CM

## 2018-09-17 NOTE — Telephone Encounter (Signed)
Spoke with patient to confirm her appointment on Tuesday 09/21/18- she agreed to come in at 10:30 am to meet with research nurse for consent visit prior to lab/MD. Thanked patient and look forward to seeing her on Tuesday.   Foye Spurling, BSN, RN Clinical Research Nurse 09/17/2018 1:19 PM

## 2018-09-20 NOTE — Progress Notes (Signed)
[place]  [phone] [phone]     ID: [place] DOB: 11/27/1945  MR#: [id number]  OBS#:[id number]  Patient Care Team: Patient, No Pcp Per as PCP - General (General Practice) [name], [name], MD as Consulting Physician (Oncology) [name], [name], MD as Consulting Physician (Oncology) [name], MD as Consulting Physician (General Surgery) OTHER MD:  CHIEF COMPLAINT: Triple negative breast cancer  CURRENT TREATMENT: Awaiting randomization  On S1418   INTERVAL HISTORY: [place] returns today for follow-up of her triple negative breast cancer. She is accompanied by her husband.  The patient continues under observation.   She received radiation from 08/16/2018 to 09/15/2018. She tolerated it well overall. She did not experience any complication with her skin during treatment.  She is currently experiencing minor dry peeling and she is a little pruritic over that area, secondary to healing.  She was fatigued from radiation, but that has since resolved.   REVIEW OF SYSTEMS: [place] is doing well overall. She she still complains of some left toe neuropathy which is likely not related to either her cancer or its treatment.For Thanksgiving, she cooked. The patient has occasional mild headaches when she gets hungry but denies visual changes, nausea, vomiting, or dizziness. There has been no unusual cough, phlegm production, or pleurisy. This been no change in bowel or bladder habits. The patient denies unexplained bleeding, rash or fever. A detailed review of systems was otherwise noncontributory.    HISTORY OF CURRENT ILLNESS: [place] initially palpated a mass to her right breast in February 2019.   She brought her to her primary care physician's attention and was set up for bilateral diagnostic mammography with tomography and right breast ultrasonography on 11/25/2017 showing: a 3.2 x 3.7 cm lobulated mass in the UOQ of the  right breast; there was a 3 cm calcified retroareolar mass in the left breast. On right breast ultrasonography there was a complex mostly solid 2.4 x 3.1 cm mass at 10 o'clock and multiple enlarged right axillary lymph nodes, the largest measuring 1.6 x 2.4.  Accordingly on 11/27/2017 the patient proceeded to biopsy of the right breast area in question, as well as an attempted axillary node biopsy. The pathology from this procedure showed ([id number]):  high grade infiltrating ductal carcinoma with no lymphovascular invasion.  The attempted axillary node biopsy showed fat, no nodal tissue.  Prognostic indicators were: estrogen and progesterone receptor negative,. Proliferation marker Ki67 at >80%. HER2 negative by immunohistochemistry at 0%  CT scans of the chest abdomen and pelvis completed on 12/10/2017 showed A complex solid and cystic 3.5 cm right breast mass with several bilateral axillary lymph nodes, the largest in the right axilla measured 12 mm. There was no evidence of visceral metastatic disease.   She had a bone scan on 12/10/2017 that showed two areas of increased uptake overlying the lateral aspects of the T11 vertebrae. Review of the CT scan of the chest revealed sclerosis of the T11 pedicles concerning for bone metastases.  However subsequent spinal MRI (I do not have that report) read this out as arthritis, not a metastatic deposit  The patient had a echocardiogram completed on 3/72019 with results showing: left ventricular ejection fraction of 55-60% with mild concentric hypertrophy. There was mild to moderate mitral and mild tricuspid regurgitation with mild pulmonary hypertension. The aortic valve was mildly thickened with trace aortic regurgitation.   The patient's subsequent history is as detailed below.  PAST MEDICAL HISTORY: Past Medical  History:  Diagnosis Date  . Arthritis   . Breast cancer ([place])    Right breast  . Cataract    "early" per patient  . GERD (gastroesophageal  reflux disease)    occasionaly tums    PAST SURGICAL HISTORY: Past Surgical History:  Procedure Laterality Date  . ABDOMINAL HYSTERECTOMY     Total hysterectomy in 2002  . BREAST LUMPECTOMY WITH RADIOACTIVE SEED AND SENTINEL LYMPH NODE BIOPSY Right 06/28/2018   Procedure: BREAST LUMPECTOMY WITH RADIOACTIVE SEED AND SENTINEL LYMPH NODE BIOPSY;  Surgeon: [name], MD;  Location: [place];  Service: General;  Laterality: Right;  . PORTACATH PLACEMENT N/A 12/29/2017   Procedure: INSERTION PORT-A-CATH LEFT SUBCLAVIAN;  Surgeon: [name], MD;  Location: [place];  Service: General;  Laterality: N/A;    FAMILY HISTORY Family History  Problem Relation Age of Onset  . Diabetes Father   . Heart attack Father    She notes that her father died from a possible MI in his early 9's Her mother is still alive and will be 82 March 2019!  The patient has 2 brothers and 2 sisters. Her brother had prostate cancer possibly agent orange related. She has a cousin who was recently diagnosed with stomach cancer. Her maternal grandmother died from colon cancer. She has a maternal uncle with bone cancer. She denies family hx of breast or ovarian cancer.    GYNECOLOGIC HISTORY:  Menarche: 72 years old Age at first live birth: 72 years old [place] P3 LMP: at age 1 Contraceptive: OCP HRT   Hysterectomy? 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 She also worked at [employer]. She lives at home with her husband, Gwyndolyn Saxon who is a retired Building control surveyor, her husband works part time driving an adult day care bus. Her daughter, [name] is a Multimedia programmer. Her daughter [name] lives in [place], [place] and works as a Therapist, sports for the [place]. Her son Nicosha Struve, lives in [place], and works as a Administrator.  The patient has 3 grandchildren and no great-grandchildren. She is of baptist faith.     ADVANCED DIRECTIVES:    HEALTH MAINTENANCE: Social History   Tobacco Use  . Smoking status: Never Smoker  . Smokeless tobacco: Never Used  Substance Use Topics  . Alcohol use: Never    Frequency: Never  . Drug use: Never     Colonoscopy: UTD; last year (2018)  PAP:  Bone density:    Allergies  Allergen Reactions  . Prednisone Swelling and Other (See Comments)    " I think it was this that made my tongue swell "    Current Outpatient Medications  Medication Sig Dispense Refill  . naproxen sodium (ALEVE) 220 MG tablet Take 220 mg by mouth daily as needed (for pain).    . prochlorperazine (COMPAZINE) 10 MG tablet Take 10 mg by mouth every 6 (six) hours as needed for nausea or vomiting.     No current facility-administered medications for this visit.    Facility-Administered Medications Ordered in Other Visits  Medication Dose Route Frequency Provider Last Rate Last Dose  . heparin lock flush 100 unit/mL  250 Units Intracatheter Once [name], [name], MD        OBJECTIVE: Middle-aged African-American woman in no acute distress  There were no vitals filed for this visit.   There is no height or weight  on file to calculate BMI.   Wt Readings from Last 3 Encounters:  07/16/18 167 lb 1.6 oz (75.8 kg)  06/28/18 169 lb 12.1 oz (77 kg)  06/21/18 167 lb 9.6 oz (76 kg)  ECOG FS:1 - Symptomatic but completely ambulatory  Sclerae unicteric, pupils round and equal No cervical or supraclavicular adenopathy Lungs no rales or rhonchi Heart regular rate and rhythm Abd soft, nontender, positive bowel sounds MSK no focal spinal tenderness, no upper extremity lymphedema Neuro: nonfocal, well oriented, appropriate affect Breasts: The right breast has undergone lumpectomy.  There is mild hyperpigmentation and very minimal dry desquamation in the inframammary fold.  The left breast is benign.  Both axillae are benign.  LAB RESULTS:  CMP     Component Value Date/Time   NA 143  07/16/2018 0901   K 4.1 07/16/2018 0901   CL 107 07/16/2018 0901   CO2 26 07/16/2018 0901   GLUCOSE 74 07/16/2018 0901   BUN 13 07/16/2018 0901   CREATININE 0.83 07/16/2018 0901   CREATININE 0.80 01/11/2018 0747   CALCIUM 9.6 07/16/2018 0901   PROT 7.1 07/16/2018 0901   ALBUMIN 3.8 07/16/2018 0901   AST 18 07/16/2018 0901   AST 17 01/11/2018 0747   ALT 16 07/16/2018 0901   ALT 16 01/11/2018 0747   ALKPHOS 78 07/16/2018 0901   BILITOT 0.3 07/16/2018 0901   BILITOT 0.6 01/11/2018 0747   GFRNONAA >60 07/16/2018 0901   GFRNONAA >60 01/11/2018 0747   GFRAA >60 07/16/2018 0901   GFRAA >60 01/11/2018 0747    No results found for: TOTALPROTELP, ALBUMINELP, A1GS, A2GS, BETS, BETA2SER, GAMS, MSPIKE, SPEI  No results found for: KPAFRELGTCHN, LAMBDASER, KAPLAMBRATIO  Lab Results  Component Value Date   WBC 4.2 07/16/2018   NEUTROABS 2.0 07/16/2018   HGB 11.5 (L) 07/16/2018   HCT 35.7 (L) 07/16/2018   MCV 103.5 (H) 07/16/2018   PLT 195 07/16/2018    @LASTCHEMISTRY @  No results found for: LABCA2  No components found for: TGGYIR485  No results for input(s): INR in the last 168 hours.  No results found for: LABCA2  No results found for: IOE703  No results found for: JKK938  No results found for: HWE993  No results found for: CA2729  No components found for: HGQUANT  No results found for: CEA1 / No results found for: CEA1   No results found for: AFPTUMOR  No results found for: CHROMOGRNA  No results found for: PSA1  No visits with results within 3 Day(s) from this visit.  Latest known visit with results is:  Appointment on 07/16/2018  Component Date Value Ref Range Status  . Sodium 07/16/2018 143  135 - 145 mmol/L Final  . Potassium 07/16/2018 4.1  3.5 - 5.1 mmol/L Final  . Chloride 07/16/2018 107  98 - 111 mmol/L Final  . CO2 07/16/2018 26  22 - 32 mmol/L Final  . Glucose, Bld 07/16/2018 74  70 - 99 mg/dL Final  . BUN 07/16/2018 13  8 - 23 mg/dL Final  .  Creatinine, Ser 07/16/2018 0.83  0.44 - 1.00 mg/dL Final  . Calcium 07/16/2018 9.6  8.9 - 10.3 mg/dL Final  . Total Protein 07/16/2018 7.1  6.5 - 8.1 g/dL Final  . Albumin 07/16/2018 3.8  3.5 - 5.0 g/dL Final  . AST 07/16/2018 18  15 - 41 U/L Final  . ALT 07/16/2018 16  0 - 44 U/L Final  . Alkaline Phosphatase 07/16/2018 78  38 - 126 U/L Final  .  Total Bilirubin 07/16/2018 0.3  0.3 - 1.2 mg/dL Final  . GFR calc non Af Amer 07/16/2018 >60  >60 mL/min Final  . GFR calc Af Amer 07/16/2018 >60  >60 mL/min Final   Comment: (NOTE) The eGFR has been calculated using the CKD EPI equation. This calculation has not been validated in all clinical situations. eGFR's persistently <60 mL/min signify possible Chronic Kidney Disease.   Georgiann Hahn gap 07/16/2018 10  5 - 15 Final   Performed at [facility], [place] [address] [place], [place] [address]  . WBC 07/16/2018 4.2  4.0 - 10.5 K/uL Final  . RBC 07/16/2018 3.45* 3.87 - 5.11 MIL/uL Final  . Hemoglobin 07/16/2018 11.5* 12.0 - 15.0 g/dL Final  . HCT 07/16/2018 35.7* 36.0 - 46.0 % Final  . MCV 07/16/2018 103.5* 80.0 - 100.0 fL Final  . MCH 07/16/2018 33.3  26.0 - 34.0 pg Final  . MCHC 07/16/2018 32.2  30.0 - 36.0 g/dL Final  . RDW 07/16/2018 14.7  11.5 - 15.5 % Final  . Platelets 07/16/2018 195  150 - 400 K/uL Final  . nRBC 07/16/2018 0.0  0.0 - 0.2 % Final  . Neutrophils Relative % 07/16/2018 46  % Final  . Neutro Abs 07/16/2018 2.0  1.7 - 7.7 K/uL Final  . Lymphocytes Relative 07/16/2018 18  % Final  . Lymphs Abs 07/16/2018 0.8  0.7 - 4.0 K/uL Final  . Monocytes Relative 07/16/2018 27  % Final  . Monocytes Absolute 07/16/2018 1.1* 0.1 - 1.0 K/uL Final  . Eosinophils Relative 07/16/2018 8  % Final  . Eosinophils Absolute 07/16/2018 0.3  0.0 - 0.5 K/uL Final  . Basophils Relative 07/16/2018 1  % Final  . Basophils Absolute 07/16/2018 0.0  0.0 - 0.1 K/uL Final  . Immature Granulocytes 07/16/2018 0  % Final  . Abs Immature  Granulocytes 07/16/2018 0.01  0.00 - 0.07 K/uL Final   Performed at [facility], [place] [address], [place], [place] [address]    (this displays the last labs from the last 3 days)  No results found for: TOTALPROTELP, ALBUMINELP, A1GS, A2GS, BETS, BETA2SER, GAMS, MSPIKE, SPEI (this displays SPEP labs)  No results found for: KPAFRELGTCHN, LAMBDASER, KAPLAMBRATIO (kappa/lambda light chains)  No results found for: HGBA, HGBA2QUANT, HGBFQUANT, HGBSQUAN (Hemoglobinopathy evaluation)   No results found for: LDH  No results found for: IRON, TIBC, IRONPCTSAT (Iron and TIBC)  No results found for: FERRITIN  Urinalysis No results found for: COLORURINE, APPEARANCEUR, LABSPEC, PHURINE, GLUCOSEU, HGBUR, BILIRUBINUR, KETONESUR, PROTEINUR, UROBILINOGEN, NITRITE, LEUKOCYTESUR   STUDIES: No results found.  ELIGIBLE FOR AVAILABLE RESEARCH PROTOCOL:  SWOG [id number]   ASSESSMENT: 72 y.o. [place], [place] woman status post right breast upper outer quadrant biopsy 11/25/2017 for a clinical T2 N2, stage IIIc invasive ductal carcinoma, grade 3, triple negative, with an MIB-1 of 80%.  (1) staging studies:  (a) chest CT 12/10/2017 scan showed no visceral metastatic disease  (b) bone scan 12/10/2017 showed uptake at T11, with sclerosis.  (c) CA-27-29 on 12/05/2017 was 19.0  (d) spinal MRI in [place] reportedly showed only arthrtis at T11  (2) neoadjuvant chemotherapy consisting of carboplatin/ paclitaxel x 12 starting 01/04/2018, discontinued 03/15/2018, followed by cyclophosphamide and doxorubicin given every 3 weeks x 4 starting 04/05/2018   (a) carboplatin/paclitaxel changed to carboplatin/gemcitabine after 9 cycles of Taxol/Carboplatin due to neuroapthy  (b) only able to tolerate 1 cycle of Gemcitabine/Carboplatin due to rash  (a) echocardiogram on 04/02/18 demonstrates a LVEF  of 60-65%  (3) status post right lumpectomy and sentinel lymph node sampling on 06/28/2018 showing a residual  2.2 cm, grade 3 invasive ductal carcinoma, with negative margins and 0 of 3 sentinel lymph nodes removed involved  (4) adjuvant radiation through the [facility] group in [place] mid November through mid December 2019  (5) consented to the pembrolizumab study 09/21/2018   PLAN:   [place] has completed standard treatment.  She tolerated it remarkably well.  Her hair is already coming back quite fully.  She has normal functional status aside from mild fatigue.  Today we discussed the S 1418 study.  She understands the more data we get on anti-PDL 1 and anti-PD1 drugs the better they look.  She consented today to the study and she will be randomized once we send enough tissue for testing and then she needs to complete some questionnaires.  Tentatively she is being scheduled to return 10/12/2018) -2 treatment she will receive her first dose of pembrolizumab that day  I would like to see her with her second dose if she does get treated every 3 weeks just to make sure there are no unusual side effects  She requested a flu shot today which we were glad to provide  She knows to call for any other issues that may develop before the next visit.  [name], [name], MD  09/20/18 6:12 PM Medical Oncology and Hematology [facility] [address] [place], [place] [address] Tel. [phone]    Fax. [phone]   I, Jacqualyn Posey am acting as a Education administrator for Chauncey Cruel, MD.   I, Lurline Del MD, have reviewed the above documentation for accuracy and completeness, and I agree with the above.

## 2018-09-21 ENCOUNTER — Telehealth: Payer: Self-pay | Admitting: Oncology

## 2018-09-21 ENCOUNTER — Inpatient Hospital Stay: Payer: Medicare Other | Attending: Nurse Practitioner | Admitting: Oncology

## 2018-09-21 ENCOUNTER — Inpatient Hospital Stay: Payer: Medicare Other | Admitting: *Deleted

## 2018-09-21 ENCOUNTER — Inpatient Hospital Stay: Payer: Medicare Other

## 2018-09-21 ENCOUNTER — Encounter: Payer: Self-pay | Admitting: *Deleted

## 2018-09-21 ENCOUNTER — Other Ambulatory Visit: Payer: Self-pay | Admitting: Oncology

## 2018-09-21 VITALS — BP 156/83 | HR 72 | Temp 97.6°F | Resp 18 | Ht 62.0 in | Wt 164.3 lb

## 2018-09-21 DIAGNOSIS — Z006 Encounter for examination for normal comparison and control in clinical research program: Secondary | ICD-10-CM

## 2018-09-21 DIAGNOSIS — Z923 Personal history of irradiation: Secondary | ICD-10-CM | POA: Diagnosis not present

## 2018-09-21 DIAGNOSIS — Z171 Estrogen receptor negative status [ER-]: Secondary | ICD-10-CM | POA: Insufficient documentation

## 2018-09-21 DIAGNOSIS — Z9221 Personal history of antineoplastic chemotherapy: Secondary | ICD-10-CM | POA: Diagnosis not present

## 2018-09-21 DIAGNOSIS — R5383 Other fatigue: Secondary | ICD-10-CM | POA: Diagnosis not present

## 2018-09-21 DIAGNOSIS — Z23 Encounter for immunization: Secondary | ICD-10-CM | POA: Diagnosis not present

## 2018-09-21 DIAGNOSIS — C50411 Malignant neoplasm of upper-outer quadrant of right female breast: Secondary | ICD-10-CM

## 2018-09-21 LAB — COMPREHENSIVE METABOLIC PANEL
ALT: 17 U/L (ref 0–44)
AST: 18 U/L (ref 15–41)
Albumin: 4.1 g/dL (ref 3.5–5.0)
Alkaline Phosphatase: 87 U/L (ref 38–126)
Anion gap: 9 (ref 5–15)
BILIRUBIN TOTAL: 0.4 mg/dL (ref 0.3–1.2)
BUN: 11 mg/dL (ref 8–23)
CO2: 29 mmol/L (ref 22–32)
CREATININE: 0.84 mg/dL (ref 0.44–1.00)
Calcium: 9.9 mg/dL (ref 8.9–10.3)
Chloride: 104 mmol/L (ref 98–111)
GFR calc Af Amer: >60 mL/min (ref >60)
Glucose, Bld: 94 mg/dL (ref 70–99)
POTASSIUM: 4.4 mmol/L (ref 3.5–5.1)
Sodium: 142 mmol/L (ref 135–145)
TOTAL PROTEIN: 8.2 g/dL — AB (ref 6.5–8.1)

## 2018-09-21 LAB — CBC WITH DIFFERENTIAL/PLATELET
Abs Immature Granulocytes: 0.01 10*3/uL (ref 0.00–0.07)
BASOS PCT: 1 %
Basophils Absolute: 0 10*3/uL (ref 0.0–0.1)
EOS PCT: 3 %
Eosinophils Absolute: 0.1 10*3/uL (ref 0.0–0.5)
HCT: 40.1 % (ref 36.0–46.0)
Hemoglobin: 13.1 g/dL (ref 12.0–15.0)
Immature Granulocytes: 0 %
Lymphocytes Relative: 15 %
Lymphs Abs: 0.6 10*3/uL — ABNORMAL LOW (ref 0.7–4.0)
MCH: 33 pg (ref 26.0–34.0)
MCHC: 32.7 g/dL (ref 30.0–36.0)
MCV: 101 fL — ABNORMAL HIGH (ref 80.0–100.0)
Monocytes Absolute: 0.6 10*3/uL (ref 0.1–1.0)
Monocytes Relative: 15 %
Neutro Abs: 2.5 10*3/uL (ref 1.7–7.7)
Neutrophils Relative %: 66 %
PLATELETS: 192 10*3/uL (ref 150–400)
RBC: 3.97 MIL/uL (ref 3.87–5.11)
RDW: 13 % (ref 11.5–15.5)
WBC: 3.8 10*3/uL — AB (ref 4.0–10.5)
nRBC: 0 % (ref 0.0–0.2)

## 2018-09-21 MED ORDER — INFLUENZA VAC SPLIT QUAD 0.5 ML IM SUSY
PREFILLED_SYRINGE | INTRAMUSCULAR | Status: AC
  Filled 2018-09-21: qty 0.5

## 2018-09-21 MED ORDER — INFLUENZA VAC SPLIT QUAD 0.5 ML IM SUSY
0.5000 mL | PREFILLED_SYRINGE | Freq: Once | INTRAMUSCULAR | Status: AC
  Administered 2018-09-21: 0.5 mL via INTRAMUSCULAR

## 2018-09-21 NOTE — Telephone Encounter (Signed)
Per 12/17 no los °

## 2018-09-21 NOTE — Research (Signed)
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  Reviewed the study consent form in detail with patient. Reminded patient that her participation is voluntary and explained the possible risks and benefits of this study. Particular attention was given to possible side effects of Pembrolizumab. Informed patient of all study requirements and associated timeline for these activities such as Data collection, Labs, Questionnaires, MD visits and Infusion of Pembrolizumab (Arm 2). Discussed the randomization process and patient understands that neither she nor the study team or MD can choose which arm she is assigned. Patient denied any questions and states she has had plenty of time to consider this study and she wishes to enroll. Patient requests all visits be scheduled for Mondays or Tuesdays if possible since her husband is off work those days and can drive. They live in [place]  S1418 consent form for protocol version date 05/04/18,  active date 06/10/18 and hippa form dated 08/21/15 signed and dated by patient to enroll in main study including allowing for optional use of samples for future research.  Copy of both signed forms given to patient for her records.  Patient then completed her lab and MD visit needed for screening on this study.  Informed patient that research will request surgical tissue to be sent to study by the end of this week.  It will take 1 to 2 weeks to hear back from the study if tissue sample was adequate for testing.  Patient would then need to come in again to complete questionnaires and and have research labs drawn prior to being randomized.  If she is randomized to Arm 2, she will also  start on Pembrolizumab that same day.  Patient tentatively scheduled to return for study visit on 10/12/18.  Patient understands this date may change and research nurse will let her know as soon as we hear back from study about her tissue.  We will have a better idea at that time if her appointment on 10/12/17 will work.  Informed Dr. MJana Hakimthat patient signed consent form and wishes to enroll on this study.  Research nurse will request tissue and complete eligibility review.  Dr. MJana Hakimalso discussed this study with patient during her appointment and she denied any questions.  Gave patient my business card and encouraged her to call if any questions or concerns prior to next visit.  I will call her with any updates as needed.  Thanked patient and her husband very much for their time today and for her participation in this clinical trial.  CFoye Spurling BSN, RN Clinical Research Nurse 09/21/2018 12:30 PM

## 2018-09-23 ENCOUNTER — Encounter: Payer: Self-pay | Admitting: *Deleted

## 2018-09-23 NOTE — Progress Notes (Signed)
Eligibility review completed and confirmed by second research nurse, Doreatha Martin, RN.  Dr. Jana Hakim reviewed and signed eligibility form agreeing patient meets all eligibility criteria and none of the exclusion criteria to be enrolled in the S1418 Clinical Trial, Step 1. Patient registered to Step 1 via the OPEN portal to [id number] and assigned PID #[id number].    Foye Spurling, BSN, RN Clinical Research Nurse 09/23/2018 4:19 PM

## 2018-10-04 ENCOUNTER — Encounter: Payer: Self-pay | Admitting: *Deleted

## 2018-10-04 ENCOUNTER — Other Ambulatory Visit: Payer: Self-pay | Admitting: *Deleted

## 2018-10-04 DIAGNOSIS — C50411 Malignant neoplasm of upper-outer quadrant of right female breast: Secondary | ICD-10-CM

## 2018-10-04 DIAGNOSIS — Z171 Estrogen receptor negative status [ER-]: Secondary | ICD-10-CM

## 2018-10-04 DIAGNOSIS — Z006 Encounter for examination for normal comparison and control in clinical research program: Secondary | ICD-10-CM

## 2018-10-04 DIAGNOSIS — Z298 Encounter for other specified prophylactic measures: Secondary | ICD-10-CM | POA: Insufficient documentation

## 2018-10-07 ENCOUNTER — Telehealth: Payer: Self-pay | Admitting: Oncology

## 2018-10-07 ENCOUNTER — Telehealth: Payer: Self-pay | Admitting: *Deleted

## 2018-10-07 NOTE — Telephone Encounter (Signed)
Patient called to check her appointment for Tuesday, 10/19/18.  Informed patient that the study has not notified us yet if her tissue sample was adequate but we expect to hear from them soon.  Plan to keep appt as scheduled for now and research nurse will call her to confirm when we are notified by the study that patient can proceed to the next step of registration process/ randomization.  Patient verbalized understanding.  Foye Spurling, BSN, RN Clinical Research Nurse 10/07/2018 3:51 PM

## 2018-10-07 NOTE — Telephone Encounter (Signed)
Added infusion after 11 am f/u 1/14 - (inf log - MX). Research will inform patient.

## 2018-10-15 ENCOUNTER — Telehealth: Payer: Self-pay | Admitting: *Deleted

## 2018-10-15 DIAGNOSIS — C50411 Malignant neoplasm of upper-outer quadrant of right female breast: Secondary | ICD-10-CM

## 2018-10-15 DIAGNOSIS — Z298 Encounter for other specified prophylactic measures: Secondary | ICD-10-CM

## 2018-10-15 DIAGNOSIS — Z171 Estrogen receptor negative status [ER-]: Secondary | ICD-10-CM

## 2018-10-15 DIAGNOSIS — Z006 Encounter for examination for normal comparison and control in clinical research program: Secondary | ICD-10-CM

## 2018-10-15 NOTE — Telephone Encounter (Signed)
Informed patient that study received pathology slides and they are adequate for testing. This means patient can proceed to randomization on the S1418 study.  Asked patient if she can come in at 9 am on Tuesday 1/14 instead of 10 am due to need to add Flush appointment for lab and research labs for [facility] sub- study are required to be drawn before 10 am if possible.  Patient agreed to come in at 9 am.  Reminded patient if she is randomized to the study drug arm, she will stay for infusion of Pembrolizumab and if she is randomized to the observation arm, she will not receive the drug. Patient verbalized understanding. Thanked patient for coming in earlier and scheduling request sent to move lab to 9 am and add flush appt for 10/19/18. Foye Spurling, BSN, RN Clinical Research Nurse 10/15/2018 11:19 AM

## 2018-10-18 ENCOUNTER — Other Ambulatory Visit: Payer: Self-pay | Admitting: *Deleted

## 2018-10-18 DIAGNOSIS — Z79899 Other long term (current) drug therapy: Secondary | ICD-10-CM

## 2018-10-18 NOTE — Progress Notes (Signed)
Veronica Price  [phone] [phone]     ID: Veronica Price DOB: 1945-12-16  MR#: [id number]  CSN#:[id number]  Patient Care Team: Patient, No Pcp Per as PCP - General (General Practice) Veronica Price, [name], MD as Consulting Physician (Oncology) Veronica Price, [name], MD as Consulting Physician (Oncology) Veronica Skates, MD as Consulting Physician (General Surgery) Veronica Klinefelter, MD as Radiation Oncologist (Radiation Oncology) Veronica Cower, RN as Registered Nurse OTHER MD:   CHIEF COMPLAINT: Triple negative breast cancer  CURRENT TREATMENT:  On S1418 study, randomized to observaton   INTERVAL HISTORY: Veronica Price returns today for follow-up and treatment of her triple negative breast cancer. She is accompanied by her husband.  Since her last visit here, she has not undergone any additional studies.  Her last breast imaging was her MRI preop June 16, 2018.  She usually obtains mammography in February   REVIEW OF SYSTEMS: Veronica Price recently got a sore throat, cough, and runny nose, which has since resolved. She normally keeps her mom, but since Veronica Price started treatment, her sister has been taking care of their mother. She went to visit family for the holidays. The patient denies unusual headaches, visual changes, nausea, vomiting, or dizziness. There has been no phlegm production or pleurisy. This been no change in bowel or bladder habits. The patient denies unexplained fatigue or unexplained weight loss, bleeding, rash, or fever. A detailed review of systems was otherwise noncontributory.    HISTORY OF CURRENT ILLNESS: Veronica Price initially palpated a mass to her right breast in February 2019.   She brought her to her primary care physician's attention and was set up for bilateral diagnostic mammography with tomography and right breast ultrasonography on 11/25/2017 showing: a 3.2 x 3.7 cm lobulated mass in the UOQ of the right breast;  there was a 3 cm calcified retroareolar mass in the left breast. On right breast ultrasonography there was a complex mostly solid 2.4 x 3.1 cm mass at 10 o'clock and multiple enlarged right axillary lymph nodes, the largest measuring 1.6 x 2.4.  Accordingly on 11/27/2017 the patient proceeded to biopsy of the right breast area in question, as well as an attempted axillary node biopsy. The pathology from this procedure showed ([id number]):  high grade infiltrating ductal carcinoma with no lymphovascular invasion.  The attempted axillary node biopsy showed fat, no nodal tissue.  Prognostic indicators were: estrogen and progesterone receptor negative,. Proliferation marker Ki67 at >80%. HER2 negative by immunohistochemistry at 0%  CT scans of the chest abdomen and pelvis completed on 12/10/2017 showed A complex solid and cystic 3.5 cm right breast mass with several bilateral axillary lymph nodes, the largest in the right axilla measured 12 mm. There was no evidence of visceral metastatic disease.   She had a bone scan on 12/10/2017 that showed two areas of increased uptake overlying the lateral aspects of the T11 vertebrae. Review of the CT scan of the chest revealed sclerosis of the T11 pedicles concerning for bone metastases.  However subsequent spinal MRI (I do not have that report) read this out as arthritis, not a metastatic deposit  The patient had a echocardiogram completed on 3/72019 with results showing: left ventricular ejection fraction of 55-60% with mild concentric hypertrophy. There was mild to moderate mitral and mild tricuspid regurgitation with mild pulmonary hypertension. The aortic valve was mildly thickened with trace aortic regurgitation.   The patient's subsequent history is as detailed below.  PAST MEDICAL HISTORY: Past Medical History:  Diagnosis Date  . Arthritis 2003  . Breast cancer ([place]) 11/25/2017   Right breast  . Cataract 2016   "early" per patient-no surgery yet   . GERD  (gastroesophageal reflux disease) 2003   per patient report    PAST SURGICAL HISTORY: Past Surgical History:  Procedure Laterality Date  . ABDOMINAL HYSTERECTOMY  2002   Total hysterectomy in 2002  . BREAST LUMPECTOMY WITH RADIOACTIVE SEED AND SENTINEL LYMPH NODE BIOPSY Right 06/28/2018   Procedure: BREAST LUMPECTOMY WITH RADIOACTIVE SEED AND SENTINEL LYMPH NODE BIOPSY;  Surgeon: Veronica Skates, MD;  Location: [place] [facility];  Service: General;  Laterality: Right;  . PORTACATH PLACEMENT N/A 12/29/2017   Procedure: INSERTION PORT-A-CATH LEFT SUBCLAVIAN;  Surgeon: Veronica Skates, MD;  Location: [place];  Service: General;  Laterality: N/A;    FAMILY HISTORY Family History  Problem Relation Age of Onset  . Diabetes Father   . Heart attack Father    She notes that her father died from a possible MI in his early 3's Her mother is still alive and will be 09 December 2017!  The patient has 2 brothers and 2 sisters. Her brother had prostate cancer possibly agent orange related. She has a cousin who was recently diagnosed with stomach cancer. Her maternal grandmother died from colon cancer. She has a maternal uncle with bone cancer. She denies family hx of breast or ovarian cancer.    GYNECOLOGIC HISTORY:  Menarche: 74 years old Age at first live birth: 73 years old [place] P3 LMP: at age 45 Contraceptive: OCP HRT   Hysterectomy? 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 She also worked at [employer]. She lives at home with her husband, Veronica Price who is a retired Building control surveyor, her husband works part time driving an adult day care bus. Her daughter, Veronica Price is a Multimedia programmer. Her daughter Veronica Price lives in [place], [place] and works as a Therapist, sports for the [place]. Her son Veronica Price, lives in [place], and works as a Administrator.  The patient has 3 grandchildren and no great-grandchildren. She is  of baptist faith.    ADVANCED DIRECTIVES:    HEALTH MAINTENANCE: Social History   Tobacco Use  . Smoking status: Never Smoker  . Smokeless tobacco: Never Used  Substance Use Topics  . Alcohol use: Never    Frequency: Never  . Drug use: Never     Colonoscopy: UTD; last year (2018)  PAP:  Bone density:    Allergies  Allergen Reactions  . Prednisone Swelling and Other (See Comments)    " I think it was this that made my tongue swell "    Current Outpatient Medications  Medication Sig Dispense Refill  . levothyroxine (SYNTHROID, LEVOTHROID) 25 MCG tablet Take 2 tablets (50 mcg total) by mouth daily before breakfast. 90 tablet 3  . naproxen sodium (ALEVE) 220 MG tablet Take 220 mg by mouth daily as needed (for pain).     No current facility-administered medications for this visit.    Facility-Administered Medications Ordered in Other Visits  Medication Dose Route Frequency Provider Last Rate Last Dose  . heparin lock flush 100 unit/mL  250 Units Intracatheter Once Natalea Sutliff, [name], MD        OBJECTIVE: Middle-aged African-American woman who appears well  Vitals:   10/19/18 1022  BP: (!) 159/78  Pulse: 66  Resp: 18  Temp:  97.9 F (36.6 C)  SpO2: 99%     Body mass index is 30.95 kg/m.   Wt Readings from Last 3 Encounters:  10/19/18 169 lb 3.2 oz (76.7 kg)  09/21/18 164 lb 4.8 oz (74.5 kg)  07/16/18 167 lb 1.6 oz (75.8 kg)  ECOG FS:0 - Asymptomatic  Sclerae unicteric, EOMs intact Oropharynx clear and moist No cervical or supraclavicular adenopathy Lungs no rales or rhonchi Heart regular rate and rhythm Abd soft, nontender, positive bowel sounds MSK no focal spinal tenderness, no upper extremity lymphedema Neuro: nonfocal, well oriented, appropriate affect Breasts: Status post right lumpectomy and radiation.  There is mild distortion of the breast contour.  There is mild hyperpigmentation.  There is some scar tissue but no evidence of disease recurrence.   Left breast is benign.  Both axillae are benign.  LAB RESULTS:  CMP     Component Value Date/Time   NA 141 10/19/2018 0834   K 4.1 10/19/2018 0834   CL 107 10/19/2018 0834   CO2 27 10/19/2018 0834   GLUCOSE 91 10/19/2018 0834   BUN 15 10/19/2018 0834   CREATININE 0.98 10/19/2018 0834   CREATININE 0.80 01/11/2018 0747   CALCIUM 9.1 10/19/2018 0834   PROT 7.3 10/19/2018 0834   ALBUMIN 3.8 10/19/2018 0834   AST 15 10/19/2018 0834   AST 17 01/11/2018 0747   ALT 14 10/19/2018 0834   ALT 16 01/11/2018 0747   ALKPHOS 78 10/19/2018 0834   BILITOT 0.3 10/19/2018 0834   BILITOT 0.6 01/11/2018 0747   GFRNONAA 57 (L) 10/19/2018 0834   GFRNONAA >60 01/11/2018 0747   GFRAA >60 10/19/2018 0834   GFRAA >60 01/11/2018 0747    No results found for: TOTALPROTELP, ALBUMINELP, A1GS, A2GS, BETS, BETA2SER, GAMS, MSPIKE, SPEI  No results found for: KPAFRELGTCHN, LAMBDASER, KAPLAMBRATIO  Lab Results  Component Value Date   WBC 3.9 (L) 10/19/2018   NEUTROABS 2.5 10/19/2018   HGB 12.1 10/19/2018   HCT 36.9 10/19/2018   MCV 99.5 10/19/2018   PLT 205 10/19/2018    @LASTCHEMISTRY @  No results found for: LABCA2  No components found for: TXMIWO032  No results for input(s): INR in the last 168 hours.  No results found for: LABCA2  No results found for: ZYY482  No results found for: NOI370  No results found for: WUG891  No results found for: CA2729  No components found for: HGQUANT  No results found for: CEA1 / No results found for: CEA1   No results found for: AFPTUMOR  No results found for: CHROMOGRNA  No results found for: PSA1  Appointment on 10/19/2018  Component Date Value Ref Range Status  . TSH 10/19/2018 9.866* 0.308 - 3.960 uIU/mL Final   Performed at [facility], [place] [address] [place], [place] [address]  . Research Labs 10/19/2018 COLLECTED BY LABORATORY   Final   Performed at [facility], [place] [address] [place], [place] [address]  . Sodium 10/19/2018 141  135 - 145 mmol/L Final  . Potassium 10/19/2018 4.1  3.5 - 5.1 mmol/L Final  . Chloride 10/19/2018 107  98 - 111 mmol/L Final  . CO2 10/19/2018 27  22 - 32 mmol/L Final  . Glucose, Bld 10/19/2018 91  70 - 99 mg/dL Final  . BUN 10/19/2018 15  8 - 23 mg/dL Final  . Creatinine, Ser 10/19/2018 0.98  0.44 - 1.00 mg/dL Final  . Calcium 10/19/2018 9.1  8.9 - 10.3 mg/dL Final  .  Total Protein 10/19/2018 7.3  6.5 - 8.1 g/dL Final  . Albumin 10/19/2018 3.8  3.5 - 5.0 g/dL Final  . AST 10/19/2018 15  15 - 41 U/L Final  . ALT 10/19/2018 14  0 - 44 U/L Final  . Alkaline Phosphatase 10/19/2018 78  38 - 126 U/L Final  . Total Bilirubin 10/19/2018 0.3  0.3 - 1.2 mg/dL Final  . GFR calc non Af Amer 10/19/2018 57* >60 mL/min Final  . GFR calc Af Amer 10/19/2018 >60  >60 mL/min Final  . Anion gap 10/19/2018 7  5 - 15 Final   Performed at [facility], [place] [address], [place], [place] [address]  . WBC 10/19/2018 3.9* 4.0 - 10.5 K/uL Final  . RBC 10/19/2018 3.71* 3.87 - 5.11 MIL/uL Final  . Hemoglobin 10/19/2018 12.1  12.0 - 15.0 g/dL Final  . HCT 10/19/2018 36.9  36.0 - 46.0 % Final  . MCV 10/19/2018 99.5  80.0 - 100.0 fL Final  . MCH 10/19/2018 32.6  26.0 - 34.0 pg Final  . MCHC 10/19/2018 32.8  30.0 - 36.0 g/dL Final  . RDW 10/19/2018 13.3  11.5 - 15.5 % Final  . Platelets 10/19/2018 205  150 - 400 K/uL Final  . nRBC 10/19/2018 0.0  0.0 - 0.2 % Final  . Neutrophils Relative % 10/19/2018 62  % Final  . Neutro Abs 10/19/2018 2.5  1.7 - 7.7 K/uL Final  . Lymphocytes Relative 10/19/2018 19  % Final  . Lymphs Abs 10/19/2018 0.7  0.7 - 4.0 K/uL Final  . Monocytes Relative 10/19/2018 15  % Final  . Monocytes Absolute 10/19/2018 0.6  0.1 - 1.0 K/uL Final  . Eosinophils Relative 10/19/2018 3  % Final  . Eosinophils Absolute 10/19/2018 0.1  0.0 - 0.5 K/uL Final  . Basophils Relative 10/19/2018 1  % Final  . Basophils Absolute  10/19/2018 0.0  0.0 - 0.1 K/uL Final  . Immature Granulocytes 10/19/2018 0  % Final  . Abs Immature Granulocytes 10/19/2018 0.01  0.00 - 0.07 K/uL Final   Performed at [facility], [place] [address], [place], [place] [address]    (this displays the last labs from the last 3 days)  No results found for: TOTALPROTELP, ALBUMINELP, A1GS, A2GS, BETS, BETA2SER, GAMS, MSPIKE, SPEI (this displays SPEP labs)  No results found for: KPAFRELGTCHN, LAMBDASER, KAPLAMBRATIO (kappa/lambda light chains)  No results found for: HGBA, HGBA2QUANT, HGBFQUANT, HGBSQUAN (Hemoglobinopathy evaluation)   No results found for: LDH  No results found for: IRON, TIBC, IRONPCTSAT (Iron and TIBC)  No results found for: FERRITIN  Urinalysis No results found for: COLORURINE, APPEARANCEUR, LABSPEC, PHURINE, GLUCOSEU, HGBUR, BILIRUBINUR, KETONESUR, PROTEINUR, UROBILINOGEN, NITRITE, LEUKOCYTESUR   STUDIES: No results found.  ELIGIBLE FOR AVAILABLE RESEARCH PROTOCOL:  SWOG R4270   ASSESSMENT: 73 y.o. Veronica Price, [place] woman status post right breast upper outer quadrant biopsy 11/25/2017 for a clinical T2 N2, stage IIIc invasive ductal carcinoma, grade 3, triple negative, with an MIB-1 of 80%.  (1) staging studies:  (a) chest CT 12/10/2017 scan showed no visceral metastatic disease  (b) bone scan 12/10/2017 showed uptake at T11, with sclerosis.  (c) CA-27-29 on 12/05/2017 was 19.0  (d) spinal MRI in [place] reportedly showed only arthrtis at T11  (2) neoadjuvant chemotherapy consisting of carboplatin/ paclitaxel x 12 starting 01/04/2018, discontinued 03/15/2018, followed by cyclophosphamide and doxorubicin given every 3 weeks x 4 starting 04/05/2018   (a) carboplatin/paclitaxel changed to carboplatin/gemcitabine after 9 cycles of  Taxol/Carboplatin due to neuroapthy  (b) only able to tolerate 1 cycle of Gemcitabine/Carboplatin due to rash  (a) echocardiogram on 04/02/18 demonstrates a LVEF of  60-65%  (3) status post right lumpectomy and sentinel lymph node sampling on 06/28/2018 showing a residual 2.2 cm, grade 3 invasive ductal carcinoma, with negative margins and 0 of 3 sentinel lymph nodes removed involved  (4) adjuvant radiation through the [facility] group in [place] mid November through mid December 2019  (5) participating in the pembrolizumab study S1418, randomized to observation   PLAN:   Irie has recovered well from her treatments and I do not pick up any symptoms related to her earlier therapy.  Her exam is unremarkable and her lab work is fine.  Please note that a total white cell count of 3.7 is normal in an African-American.  She is being randomized today.  I am hopeful that she will receive the treatment arm and today we again went over pembrolizumab's possible toxicities, side effects and complications.  As soon as we have the remaining lab work we will know whether she will be treated or not.  She needs mammography and we are setting that up for mid February.  She is borderline hypothyroid on I am starting her on Synthroid 25 mcg daily.   Veronica Price, [name], MD  10/19/18 12:31 PM Medical Oncology and Hematology [facility] [address] [place],  [address] Tel. [phone]    Fax. [phone]  I, Veronica Price am acting as a Education administrator for Veronica Cruel, MD.   I, Veronica Del MD, have reviewed the above documentation for accuracy and completeness, and I agree with the above.   ADDENDUM: Charnee has been randomized to observation.  As far as the study is concerned she will see me again in April.  I would be interested in her receiving pembrolizumab and possibly we could obtain this from the manufacturer.  I will initiate the process.  She has been found to be hypothyroid.  I am starting her on Synthroid 25 mcg daily starting today.  We will repeat a TSH at the next study visit  She knows to call for any other problems that may  develop before then.

## 2018-10-19 ENCOUNTER — Other Ambulatory Visit: Payer: Medicare Other

## 2018-10-19 ENCOUNTER — Inpatient Hospital Stay: Payer: Medicare Other

## 2018-10-19 ENCOUNTER — Inpatient Hospital Stay: Payer: Medicare Other | Attending: Nurse Practitioner | Admitting: Oncology

## 2018-10-19 ENCOUNTER — Encounter: Payer: Self-pay | Admitting: *Deleted

## 2018-10-19 VITALS — BP 159/78 | HR 66 | Temp 97.9°F | Resp 18 | Ht 62.0 in | Wt 169.2 lb

## 2018-10-19 DIAGNOSIS — Z9221 Personal history of antineoplastic chemotherapy: Secondary | ICD-10-CM | POA: Diagnosis not present

## 2018-10-19 DIAGNOSIS — Z79899 Other long term (current) drug therapy: Secondary | ICD-10-CM | POA: Diagnosis not present

## 2018-10-19 DIAGNOSIS — Z923 Personal history of irradiation: Secondary | ICD-10-CM | POA: Diagnosis not present

## 2018-10-19 DIAGNOSIS — Z171 Estrogen receptor negative status [ER-]: Secondary | ICD-10-CM

## 2018-10-19 DIAGNOSIS — E039 Hypothyroidism, unspecified: Secondary | ICD-10-CM | POA: Insufficient documentation

## 2018-10-19 DIAGNOSIS — C50411 Malignant neoplasm of upper-outer quadrant of right female breast: Secondary | ICD-10-CM

## 2018-10-19 DIAGNOSIS — Z006 Encounter for examination for normal comparison and control in clinical research program: Secondary | ICD-10-CM | POA: Diagnosis not present

## 2018-10-19 LAB — CBC WITH DIFFERENTIAL/PLATELET
ABS IMMATURE GRANULOCYTES: 0.01 10*3/uL (ref 0.00–0.07)
Basophils Absolute: 0 10*3/uL (ref 0.0–0.1)
Basophils Relative: 1 %
EOS ABS: 0.1 10*3/uL (ref 0.0–0.5)
Eosinophils Relative: 3 %
HEMATOCRIT: 36.9 % (ref 36.0–46.0)
HEMOGLOBIN: 12.1 g/dL (ref 12.0–15.0)
IMMATURE GRANULOCYTES: 0 %
LYMPHS ABS: 0.7 10*3/uL (ref 0.7–4.0)
LYMPHS PCT: 19 %
MCH: 32.6 pg (ref 26.0–34.0)
MCHC: 32.8 g/dL (ref 30.0–36.0)
MCV: 99.5 fL (ref 80.0–100.0)
MONOS PCT: 15 %
Monocytes Absolute: 0.6 10*3/uL (ref 0.1–1.0)
NEUTROS PCT: 62 %
Neutro Abs: 2.5 10*3/uL (ref 1.7–7.7)
Platelets: 205 10*3/uL (ref 150–400)
RBC: 3.71 MIL/uL — ABNORMAL LOW (ref 3.87–5.11)
RDW: 13.3 % (ref 11.5–15.5)
WBC: 3.9 10*3/uL — ABNORMAL LOW (ref 4.0–10.5)
nRBC: 0 % (ref 0.0–0.2)

## 2018-10-19 LAB — COMPREHENSIVE METABOLIC PANEL
ALBUMIN: 3.8 g/dL (ref 3.5–5.0)
ALT: 14 U/L (ref 0–44)
ANION GAP: 7 (ref 5–15)
AST: 15 U/L (ref 15–41)
Alkaline Phosphatase: 78 U/L (ref 38–126)
BUN: 15 mg/dL (ref 8–23)
CO2: 27 mmol/L (ref 22–32)
CREATININE: 0.98 mg/dL (ref 0.44–1.00)
Calcium: 9.1 mg/dL (ref 8.9–10.3)
Chloride: 107 mmol/L (ref 98–111)
GFR calc Af Amer: >60 mL/min (ref >60)
GFR calc non Af Amer: 57 mL/min — ABNORMAL LOW (ref >60)
GLUCOSE: 91 mg/dL (ref 70–99)
Potassium: 4.1 mmol/L (ref 3.5–5.1)
Sodium: 141 mmol/L (ref 135–145)
Total Bilirubin: 0.3 mg/dL (ref 0.3–1.2)
Total Protein: 7.3 g/dL (ref 6.5–8.1)

## 2018-10-19 LAB — TSH: TSH: 9.866 u[IU]/mL — AB (ref 0.308–3.960)

## 2018-10-19 LAB — RESEARCH LABS

## 2018-10-19 MED ORDER — LEVOTHYROXINE SODIUM 25 MCG PO TABS: 50.0000 ug | ORAL_TABLET | Freq: Every day | ORAL | 3 refills | Status: DC

## 2018-10-19 NOTE — Research (Signed)
SWOG A0762 Randomization/ Cycle 1, Day 1 Visit Patient in clinic today with her husband to complete study activities for the S1418 study and to be randomized on this study.  PROs: Study questionnaires for the [facility] sub-study given to patient to complete prior to her blood draw.  Research RN collected completed questionnaires and checked them for accuracy and completeness.  LABs: Blood drawn for research specimens and CBC, CMET and TSH per protocol.  Con Meds: Patient denies any new medications.  Taking Aleve PRN for arthritis pain in her knees.  History and Physical: Completed by Dr. Jana Hakim.  Adverse Events: Baseline AEs reviewed with patient.  See AE table below.  Randomization:  Dr. Jana Hakim agrees patient meets eligibility to be registered to Step 2 of S1418 study.  Eligibility also confirmed by second research RN, Doreatha Martin.  There have been no changes in step 1 eligibility since initial registration.  Patient registered to Step 2/ Randomized onto the [phone] clinical trial and was assigned Arm 1 Observation.  Informed Dr. Jana Hakim and he informed patient of the assignment.  Research RN further explained Observation arm, reminding patient of study visits expected every 12 weeks on this arm.  Next study visit due 01/11/19. Patient verbalized understanding. Thanked patient for her participation in this study and encouraged patient to contact research nurse if any questions or concerns before next visit. She verbalized understanding.  Baseline Adverse Events Review  Event Grade Onset Date End Date Status Comment Immune Related Radiation Related  Arthritis 1 2003  ongoing baseline No No  Cataracts 1 2016  ongoing baseline No No  Hypertension 2 12/2017  ongoing baseline No No  Hypothyroidism 2 10/19/18  ongoing  baseline    No   GERD 1 2003  ongoing  baseline    No    No  Memory impairment 1 2019  ongoing  baseline    No    No  Peripheral neuropathy 1 12/17/17  ongoing Toes  on left foot at baseline.     No    No  White Blood Cells decreased 1 09/21/18  ongoing     No    No   Foye Spurling, BSN, Therapist, sports Nurse 10/19/2018

## 2018-10-20 ENCOUNTER — Encounter: Payer: Self-pay | Admitting: *Deleted

## 2018-10-20 ENCOUNTER — Telehealth: Payer: Self-pay | Admitting: *Deleted

## 2018-10-20 DIAGNOSIS — E039 Hypothyroidism, unspecified: Secondary | ICD-10-CM

## 2018-10-20 DIAGNOSIS — Z9889 Other specified postprocedural states: Secondary | ICD-10-CM | POA: Diagnosis not present

## 2018-10-20 DIAGNOSIS — Z9221 Personal history of antineoplastic chemotherapy: Secondary | ICD-10-CM | POA: Diagnosis not present

## 2018-10-20 DIAGNOSIS — C50411 Malignant neoplasm of upper-outer quadrant of right female breast: Secondary | ICD-10-CM | POA: Diagnosis not present

## 2018-10-20 DIAGNOSIS — Z171 Estrogen receptor negative status [ER-]: Secondary | ICD-10-CM | POA: Diagnosis not present

## 2018-10-20 HISTORY — DX: Hypothyroidism, unspecified: E03.9

## 2018-10-20 MED ORDER — LEVOTHYROXINE SODIUM 25 MCG PO TABS: 25.0000 ug | ORAL_TABLET | Freq: Every day | ORAL | 3 refills | Status: DC

## 2018-10-20 NOTE — Telephone Encounter (Signed)
Clarified dose of Levothyroxine with Dr. Jana Hakim.  He states patient is to take one pill (25 mcg) daily.  Rx says to take 2 pills (64mcg) daily and Dr. Jana Hakim clarified it is only one pill daily.  Rx changed to 1 pill daily.  Called patient and instructed her to take only one pill daily per Dr. Jana Hakim.  She did take 2 pills this morning but she verbalized understanding to take only one daily until further instructions from Dr. Jana Hakim.  Informed patient that next study visit is due in 12 weeks on 01/11/19.  That appointment has not been made yet.  Dr. Jana Hakim may want to see patient back sooner and scheduling will call her if he makes patient an appointment before 01/11/19.   Research RN will follow up in a few weeks to make sure appointments have been made.  Encouaged patient to call for any questions or concerns and thanked patient for her participation.  Foye Spurling, BSN, RN Clinical Research Nurse 10/20/2018 2:21 PM

## 2018-10-22 ENCOUNTER — Telehealth: Payer: Self-pay | Admitting: Adult Health

## 2018-10-22 NOTE — Telephone Encounter (Signed)
Scheduled appt per 1/15 sch message - sent reminder letter in the mail with app date and time

## 2018-10-27 ENCOUNTER — Encounter: Payer: Self-pay | Admitting: Pharmacy Technician

## 2018-10-27 NOTE — Progress Notes (Signed)
The patient is approved for drug assistance by [employer] for [employer]. Enrollment is effective until 10/06/19 and is based on off label use. Drug replacement begins on anticipated DOS 11/09/18.

## 2018-11-01 ENCOUNTER — Other Ambulatory Visit: Payer: Self-pay | Admitting: Oncology

## 2018-11-01 DIAGNOSIS — E039 Hypothyroidism, unspecified: Secondary | ICD-10-CM

## 2018-11-05 ENCOUNTER — Other Ambulatory Visit: Payer: Self-pay | Admitting: Oncology

## 2018-11-09 ENCOUNTER — Telehealth: Payer: Self-pay | Admitting: *Deleted

## 2018-11-09 NOTE — Telephone Encounter (Signed)
S1418 Study;  Dr. Jana Hakim has arranged for patient to receive Pembrolizumb off label from the drug company.  Emailed study to notify and was instructed to still follow patient on study although she will be considered "off treatment."  I called patient to let her know Dr. Jana Hakim has obtained Pembrolizumab from drug company and he wants her to start it as soon as possible.  Patient says she received a letter a few weeks ago about this and she was waiting for our call. Asked patient if she can start next week and patient says she can start anytime as long as it is on a Monday or Tuesday.  Asked patient if she is still willing to stay on study and allow [place] to collect her information.  Patient agreed to stay on study as long as she can also get the treatment as prescribed by Dr. Jana Hakim.  Thanked patient for her time and will call her with schedule once it is made. She verbalized understanding.  Scheduling message/request sent.  Foye Spurling, BSN, RN Clinical Research Nurse 11/09/2018 3:01 PM

## 2018-11-10 ENCOUNTER — Other Ambulatory Visit: Payer: Self-pay | Admitting: Oncology

## 2018-11-11 ENCOUNTER — Telehealth: Payer: Self-pay | Admitting: Oncology

## 2018-11-11 ENCOUNTER — Telehealth: Payer: Self-pay | Admitting: *Deleted

## 2018-11-11 NOTE — Telephone Encounter (Signed)
Scheduled appt per 2/4 and 2/5 sch message - per sch message - research will contact patient with apts

## 2018-11-11 NOTE — Telephone Encounter (Signed)
LVM with patient informing her Dr. Jana Hakim scheduled her to start treatment with Pembrolizumab this Monday 2/10 at 8am with lab prior to treatment.  Asked patient to return call if any questions or if she cannot make that appointment.  Foye Spurling, BSN, RN Clinical Research Nurse 11/11/2018 1:16 PM

## 2018-11-15 ENCOUNTER — Inpatient Hospital Stay: Payer: Medicare Other

## 2018-11-15 ENCOUNTER — Encounter: Payer: Self-pay | Admitting: *Deleted

## 2018-11-15 ENCOUNTER — Inpatient Hospital Stay: Payer: Medicare Other | Attending: Nurse Practitioner

## 2018-11-15 ENCOUNTER — Inpatient Hospital Stay (HOSPITAL_BASED_OUTPATIENT_CLINIC_OR_DEPARTMENT_OTHER): Payer: Medicare Other | Admitting: Oncology

## 2018-11-15 VITALS — BP 151/70 | HR 76 | Temp 97.6°F | Resp 17 | Ht 62.0 in | Wt 168.6 lb

## 2018-11-15 DIAGNOSIS — Z5112 Encounter for antineoplastic immunotherapy: Secondary | ICD-10-CM | POA: Diagnosis not present

## 2018-11-15 DIAGNOSIS — N644 Mastodynia: Secondary | ICD-10-CM | POA: Insufficient documentation

## 2018-11-15 DIAGNOSIS — E039 Hypothyroidism, unspecified: Secondary | ICD-10-CM

## 2018-11-15 DIAGNOSIS — C50411 Malignant neoplasm of upper-outer quadrant of right female breast: Secondary | ICD-10-CM | POA: Diagnosis not present

## 2018-11-15 DIAGNOSIS — Z171 Estrogen receptor negative status [ER-]: Secondary | ICD-10-CM

## 2018-11-15 DIAGNOSIS — Z95828 Presence of other vascular implants and grafts: Secondary | ICD-10-CM

## 2018-11-15 DIAGNOSIS — R51 Headache: Secondary | ICD-10-CM | POA: Insufficient documentation

## 2018-11-15 LAB — COMPREHENSIVE METABOLIC PANEL
ALT: 14 U/L (ref 0–44)
AST: 19 U/L (ref 15–41)
Albumin: 3.6 g/dL (ref 3.5–5.0)
Alkaline Phosphatase: 72 U/L (ref 38–126)
Anion gap: 8 (ref 5–15)
BUN: 10 mg/dL (ref 8–23)
CHLORIDE: 108 mmol/L (ref 98–111)
CO2: 26 mmol/L (ref 22–32)
Calcium: 9.1 mg/dL (ref 8.9–10.3)
Creatinine, Ser: 0.91 mg/dL (ref 0.44–1.00)
GFR calc Af Amer: >60 mL/min (ref >60)
GFR calc non Af Amer: >60 mL/min (ref >60)
Glucose, Bld: 123 mg/dL — ABNORMAL HIGH (ref 70–99)
Potassium: 4.1 mmol/L (ref 3.5–5.1)
Sodium: 142 mmol/L (ref 135–145)
Total Bilirubin: 0.6 mg/dL (ref 0.3–1.2)
Total Protein: 7.1 g/dL (ref 6.5–8.1)

## 2018-11-15 LAB — CBC WITH DIFFERENTIAL/PLATELET
ABS IMMATURE GRANULOCYTES: 0.01 10*3/uL (ref 0.00–0.07)
BASOS ABS: 0 10*3/uL (ref 0.0–0.1)
Basophils Relative: 1 %
Eosinophils Absolute: 0.1 10*3/uL (ref 0.0–0.5)
Eosinophils Relative: 2 %
HCT: 37.3 % (ref 36.0–46.0)
Hemoglobin: 12.1 g/dL (ref 12.0–15.0)
IMMATURE GRANULOCYTES: 0 %
Lymphocytes Relative: 17 %
Lymphs Abs: 0.7 10*3/uL (ref 0.7–4.0)
MCH: 32 pg (ref 26.0–34.0)
MCHC: 32.4 g/dL (ref 30.0–36.0)
MCV: 98.7 fL (ref 80.0–100.0)
Monocytes Absolute: 0.5 10*3/uL (ref 0.1–1.0)
Monocytes Relative: 11 %
NEUTROS PCT: 69 %
NRBC: 0 % (ref 0.0–0.2)
Neutro Abs: 2.9 10*3/uL (ref 1.7–7.7)
PLATELETS: 162 10*3/uL (ref 150–400)
RBC: 3.78 MIL/uL — ABNORMAL LOW (ref 3.87–5.11)
RDW: 14 % (ref 11.5–15.5)
WBC: 4.2 10*3/uL (ref 4.0–10.5)

## 2018-11-15 LAB — TSH: TSH: 3.945 u[IU]/mL (ref 0.308–3.960)

## 2018-11-15 MED ORDER — SODIUM CHLORIDE 0.9% FLUSH
10.0000 mL | INTRAVENOUS | Status: DC | PRN
  Administered 2018-11-15: 10 mL
  Filled 2018-11-15: qty 10

## 2018-11-15 MED ORDER — SODIUM CHLORIDE 0.9 % IV SOLN
Freq: Once | INTRAVENOUS | Status: AC
  Administered 2018-11-15: 09:00:00 via INTRAVENOUS
  Filled 2018-11-15: qty 250

## 2018-11-15 MED ORDER — LIDOCAINE-PRILOCAINE 2.5-2.5 % EX CREA: 1.0000 "application " | TOPICAL_CREAM | CUTANEOUS | 0 refills | Status: DC | PRN

## 2018-11-15 MED ORDER — SODIUM CHLORIDE 0.9 % IV SOLN
200.0000 mg | Freq: Once | INTRAVENOUS | Status: AC
  Administered 2018-11-15: 200 mg via INTRAVENOUS
  Filled 2018-11-15: qty 8

## 2018-11-15 MED ORDER — HEPARIN SOD (PORK) LOCK FLUSH 100 UNIT/ML IV SOLN
500.0000 [IU] | Freq: Once | INTRAVENOUS | Status: AC | PRN
  Administered 2018-11-15: 500 [IU]
  Filled 2018-11-15: qty 5

## 2018-11-15 MED ORDER — SODIUM CHLORIDE 0.9% FLUSH
10.0000 mL | Freq: Once | INTRAVENOUS | Status: AC
  Administered 2018-11-15: 10 mL
  Filled 2018-11-15: qty 10

## 2018-11-15 NOTE — Progress Notes (Signed)
[facility]  Telephone:([phone]) [phone] Fax:(336) [phone]     ID: Veronica Price DOB: 02-Jan-1946  MR#: [id number]  UXN#:[id number]  Patient Care Team: Patient, No Pcp Per as PCP - General (General Practice) [name], [name], MD as Consulting Physician (Oncology) [name], [name], MD as Consulting Physician (Oncology) [name], MD as Consulting Physician (General Surgery) [name], MD as Radiation Oncologist (Radiation Oncology) Cathlean Cower, RN as Registered Nurse OTHER MD:   CHIEF COMPLAINT: Triple negative breast cancer  CURRENT TREATMENT:  On S1418 study, randomized to observaton; pembrolizumab off study   INTERVAL HISTORY: Veronica Price returns today for follow-up and treatment of her triple negative breast cancer. She is accompanied by her husband.  She is scheduled to begin on pembrolizumab today.   She is due for her yearly mammogram this month.   REVIEW OF SYSTEMS: Veronica Price reports occasional pain and soreness to her right breastshe has been raking/blowing leaves for exercise. She liked to shop and cook. She reports that her thyroid medication "keeps me in the bathroom sometimes." She states yesterday was okay. She notes a headache today. The patient denies visual changes, nausea, vomiting, stiff neck, dizziness, or gait imbalance. There has been no cough, phlegm production, or pleurisy, no chest pain or pressure, and no change in bladder habits. The patient denies fever, rash, bleeding, unexplained fatigue or unexplained weight loss. A detailed review of systems was otherwise entirely negative.   HISTORY OF CURRENT ILLNESS: Veronica Price initially palpated a mass to her right breast in February 2019.   She brought her to her primary care physician's attention and was set up for bilateral diagnostic mammography with tomography and right breast ultrasonography on 11/25/2017 showing: a 3.2 x 3.7 cm lobulated mass in the UOQ of the  right breast; there was a 3 cm calcified retroareolar mass in the left breast. On right breast ultrasonography there was a complex mostly solid 2.4 x 3.1 cm mass at 10 o'clock and multiple enlarged right axillary lymph nodes, the largest measuring 1.6 x 2.4.  Accordingly on 11/27/2017 the patient proceeded to biopsy of the right breast area in question, as well as an attempted axillary node biopsy. The pathology from this procedure showed ([id number]):  high grade infiltrating ductal carcinoma with no lymphovascular invasion.  The attempted axillary node biopsy showed fat, no nodal tissue.  Prognostic indicators were: estrogen and progesterone receptor negative,. Proliferation marker Ki67 at >80%. HER2 negative by immunohistochemistry at 0%  CT scans of the chest abdomen and pelvis completed on 12/10/2017 showed A complex solid and cystic 3.5 cm right breast mass with several bilateral axillary lymph nodes, the largest in the right axilla measured 12 mm. There was no evidence of visceral metastatic disease.   She had a bone scan on 12/10/2017 that showed two areas of increased uptake overlying the lateral aspects of the T11 vertebrae. Review of the CT scan of the chest revealed sclerosis of the T11 pedicles concerning for bone metastases.  However subsequent spinal MRI (I do not have that report) read this out as arthritis, not a metastatic deposit  The patient had a echocardiogram completed on 3/72019 with results showing: left ventricular ejection fraction of 55-60% with mild concentric hypertrophy. There was mild to moderate mitral and mild tricuspid regurgitation with mild pulmonary hypertension. The aortic valve was mildly thickened with trace aortic regurgitation.   The patient's subsequent history is as detailed below.  PAST MEDICAL HISTORY: Past Medical History:  Diagnosis Date  .  Arthritis 2003  . Breast cancer ([place]) 11/25/2017   Right breast  . Cataract 2016   "early" per patient-no surgery  yet   . GERD (gastroesophageal reflux disease) 2003   per patient report  . Hypothyroidism 10/20/2018    PAST SURGICAL HISTORY: Past Surgical History:  Procedure Laterality Date  . ABDOMINAL HYSTERECTOMY  2002   Total hysterectomy in 2002  . BREAST LUMPECTOMY WITH RADIOACTIVE SEED AND SENTINEL LYMPH NODE BIOPSY Right 06/28/2018   Procedure: BREAST LUMPECTOMY WITH RADIOACTIVE SEED AND SENTINEL LYMPH NODE BIOPSY;  Surgeon: [name], MD;  Location: [facility];  Service: General;  Laterality: Right;  . PORTACATH PLACEMENT N/A 12/29/2017   Procedure: INSERTION PORT-A-CATH LEFT SUBCLAVIAN;  Surgeon: [name], MD;  Location: [facility];  Service: General;  Laterality: N/A;    FAMILY HISTORY Family History  Problem Relation Age of Onset  . Diabetes Father   . Heart attack Father    She notes that her father died from a possible MI in his early 78's Her mother is still alive and will be 24 December 2017!  The patient has 2 brothers and 2 sisters. Her brother had prostate cancer possibly agent orange related. She has a cousin who was recently diagnosed with stomach cancer. Her maternal grandmother died from colon cancer. She has a maternal uncle with bone cancer. She denies family hx of breast or ovarian cancer.    GYNECOLOGIC HISTORY:  Menarche: 73 years old Age at first live birth: 73 years old [place] P3 LMP: at age 54 Contraceptive: OCP HRT   Hysterectomy? 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 She also worked at [employer]. She lives at home with her husband, Veronica Price who is a retired Building control surveyor, her husband works part time driving an adult day care bus. Her daughter, Veronica Price is a Multimedia programmer. Her daughter Veronica Price lives in [place], [place] and works as a Therapist, sports for the [place]. Her son Veronica Price, lives in [place], and works as a Administrator.  The patient has 3  grandchildren and no great-grandchildren. She is of baptist faith.    ADVANCED DIRECTIVES:    HEALTH MAINTENANCE: Social History   Tobacco Use  . Smoking status: Never Smoker  . Smokeless tobacco: Never Used  Substance Use Topics  . Alcohol use: Never    Frequency: Never  . Drug use: Never     Colonoscopy: UTD; last year (2018)  PAP:  Bone density:    Allergies  Allergen Reactions  . Prednisone Swelling and Other (See Comments)    " I think it was this that made my tongue swell "    Current Outpatient Medications  Medication Sig Dispense Refill  . levothyroxine (SYNTHROID, LEVOTHROID) 25 MCG tablet Take 1 tablet (25 mcg total) by mouth daily before breakfast. 90 tablet 3  . naproxen sodium (ALEVE) 220 MG tablet Take 220 mg by mouth daily as needed (for pain).     No current facility-administered medications for this visit.    Facility-Administered Medications Ordered in Other Visits  Medication Dose Route Frequency Provider Last Rate Last Dose  . heparin lock flush 100 unit/mL  250 Units Intracatheter Once [name], [name], MD        OBJECTIVE: Middle-aged African-American Price in no acute distress  Vitals:   11/15/18 0825  BP: (!) 151/70  Pulse: 76  Resp: 17  Temp: 97.6 F (36.4 C)  SpO2: 100%     Body mass index is 30.84 kg/m.   Wt Readings from Last 3 Encounters:  11/15/18 168 lb 9.6 oz (76.5 kg)  10/19/18 169 lb 3.2 oz (76.7 kg)  09/21/18 164 lb 4.8 oz (74.5 kg)  ECOG FS:1 - Symptomatic but completely ambulatory  Hair is coming in nicely Sclerae unicteric, pupils round and equal Oropharynx full upper plate No cervical or supraclavicular adenopathy Lungs no rales or rhonchi Heart regular rate and rhythm Abd soft, nontender, positive bowel sounds MSK no focal spinal tenderness, no upper extremity lymphedema Neuro: nonfocal, well oriented, appropriate affect Breasts: Status post right lumpectomy and radiation.  There is moderate distortion of the  breast contour.  There is moderate hyperpigmentation particularly over the scar area.  There is no findings suggestive of recurrent disease.  The left breast is benign.  Both axillae are benign.   LAB RESULTS:  CMP     Component Value Date/Time   NA 141 10/19/2018 0834   K 4.1 10/19/2018 0834   CL 107 10/19/2018 0834   CO2 27 10/19/2018 0834   GLUCOSE 91 10/19/2018 0834   BUN 15 10/19/2018 0834   CREATININE 0.98 10/19/2018 0834   CREATININE 0.80 01/11/2018 0747   CALCIUM 9.1 10/19/2018 0834   PROT 7.3 10/19/2018 0834   ALBUMIN 3.8 10/19/2018 0834   AST 15 10/19/2018 0834   AST 17 01/11/2018 0747   ALT 14 10/19/2018 0834   ALT 16 01/11/2018 0747   ALKPHOS 78 10/19/2018 0834   BILITOT 0.3 10/19/2018 0834   BILITOT 0.6 01/11/2018 0747   GFRNONAA 57 (L) 10/19/2018 0834   GFRNONAA >60 01/11/2018 0747   GFRAA >60 10/19/2018 0834   GFRAA >60 01/11/2018 0747    No results found for: TOTALPROTELP, ALBUMINELP, A1GS, A2GS, BETS, BETA2SER, GAMS, MSPIKE, SPEI  No results found for: Nils Pyle, [facility]  Lab Results  Component Value Date   WBC 4.2 11/15/2018   NEUTROABS 2.9 11/15/2018   HGB 12.1 11/15/2018   HCT 37.3 11/15/2018   MCV 98.7 11/15/2018   PLT 162 11/15/2018    @LASTCHEMISTRY @  No results found for: LABCA2  No components found for: JFHLKT625  No results for input(s): INR in the last 168 hours.  No results found for: LABCA2  No results found for: WLS937  No results found for: DSK876  No results found for: OTL572  No results found for: CA2729  No components found for: HGQUANT  No results found for: CEA1 / No results found for: CEA1   No results found for: AFPTUMOR  No results found for: CHROMOGRNA  No results found for: PSA1  Appointment on 11/15/2018  Component Date Value Ref Range Status  . WBC 11/15/2018 4.2  4.0 - 10.5 K/uL Final  . RBC 11/15/2018 3.78* 3.87 - 5.11 MIL/uL Final  . Hemoglobin 11/15/2018 12.1  12.0 - 15.0  g/dL Final  . HCT 11/15/2018 37.3  36.0 - 46.0 % Final  . MCV 11/15/2018 98.7  80.0 - 100.0 fL Final  . MCH 11/15/2018 32.0  26.0 - 34.0 pg Final  . MCHC 11/15/2018 32.4  30.0 - 36.0 g/dL Final  . RDW 11/15/2018 14.0  11.5 - 15.5 % Final  . Platelets 11/15/2018 162  150 - 400 K/uL Final  . nRBC 11/15/2018 0.0  0.0 - 0.2 % Final  . Neutrophils Relative % 11/15/2018 69  % Final  . Neutro Abs 11/15/2018 2.9  1.7 - 7.7 K/uL  Final  . Lymphocytes Relative 11/15/2018 17  % Final  . Lymphs Abs 11/15/2018 0.7  0.7 - 4.0 K/uL Final  . Monocytes Relative 11/15/2018 11  % Final  . Monocytes Absolute 11/15/2018 0.5  0.1 - 1.0 K/uL Final  . Eosinophils Relative 11/15/2018 2  % Final  . Eosinophils Absolute 11/15/2018 0.1  0.0 - 0.5 K/uL Final  . Basophils Relative 11/15/2018 1  % Final  . Basophils Absolute 11/15/2018 0.0  0.0 - 0.1 K/uL Final  . Immature Granulocytes 11/15/2018 0  % Final  . Abs Immature Granulocytes 11/15/2018 0.01  0.00 - 0.07 K/uL Final   Performed at [facility], [place] [address], [place], [place] [address]    (this displays the last labs from the last 3 days)  No results found for: TOTALPROTELP, ALBUMINELP, A1GS, A2GS, BETS, BETA2SER, GAMS, MSPIKE, SPEI (this displays SPEP labs)  No results found for: KPAFRELGTCHN, LAMBDASER, KAPLAMBRATIO (kappa/lambda light chains)  No results found for: HGBA, HGBA2QUANT, HGBFQUANT, HGBSQUAN (Hemoglobinopathy evaluation)   No results found for: LDH  No results found for: IRON, TIBC, IRONPCTSAT (Iron and TIBC)  No results found for: FERRITIN  Urinalysis No results found for: COLORURINE, APPEARANCEUR, LABSPEC, PHURINE, GLUCOSEU, HGBUR, BILIRUBINUR, KETONESUR, PROTEINUR, UROBILINOGEN, NITRITE, LEUKOCYTESUR   STUDIES: No results found.  ELIGIBLE FOR AVAILABLE RESEARCH PROTOCOL:  SWOG [phone] (observation arm)  ASSESSMENT: 73 y.o. Veronica Price, [place] Price status post right breast upper outer quadrant biopsy  11/25/2017 for a clinical T2 N2, stage IIIc invasive ductal carcinoma, grade 3, triple negative, with an MIB-1 of 80%.  (1) staging studies:  (a) chest CT 12/10/2017 scan showed no visceral metastatic disease  (b) bone scan 12/10/2017 showed uptake at T11, with sclerosis.  (c) CA-27-29 on 12/05/2017 was 19.0  (d) spinal MRI in [place] reportedly showed only arthrtis at T11  (2) neoadjuvant chemotherapy consisting of carboplatin/ paclitaxel x 12 (received 9) started 01/04/2018, discontinued 03/15/2018, followed by cyclophosphamide and doxorubicin given every 3 weeks x 4 starting 04/05/2018, completed 06/14/2018  (a) carboplatin/paclitaxel changed to carboplatin/gemcitabine after 8 cycles due to neuroapthy  (b) only able to tolerate 1 cycle of Gemcitabine/Carboplatin due to rash  (a) echocardiogram on 04/02/18 demonstrates a LVEF of 60-65%  (3) status post right lumpectomy and sentinel lymph node sampling on 06/28/2018 showing a residual 2.2 cm, grade 3 invasive ductal carcinoma, with negative margins and 0 of 3 sentinel lymph nodes removed involved (ypT2 ypN0)  (4) adjuvant [facility] [place] mid November through mid December 2019  (5) participating in the pembrolizumab study S1418, randomized to observation  (6) pembrolizumab started (not as part of study) 11/15/2018, to be repeated every 21 days for 1 year  PLAN:   Veronica Price is pretty close to her baseline and is ready to start pembrolizumab today.  We again discussed the possible toxicities side effects and complications of this agent and I have asked her to keep tabs on any Veronica symptom that she may experience over the next several days.  She is scheduled to see me again in a week.  However if she has had absolutely no symptoms from the [place] she will cancel that appointment and return 12/06/2018 for her second dose.  The plan is to repeat benralizumab for 1 year, which will parallel the treatment she would have  received on trial had she been randomized to treatment.  We will monitor her TSH.  She is due for repeat mammography and that has been ordered  She knows to call for any other issue that may develop before the next visit.  [name], [name], MD  11/15/18 8:38 AM Medical Oncology and Hematology [facility] [address] [place], [place] [address] Tel. [phone]    Fax. [phone]  I, Wilburn Mylar, am acting as scribe for Dr. [name]. [name].  I, Lurline Del MD, have reviewed the above documentation for accuracy and completeness, and I agree with the above.

## 2018-11-15 NOTE — Research (Signed)
Withdrawal of Consent for [phone];  Patient in clinic this morning to see Dr. Jana Hakim and start on Pembrolizumab infusion which MD has obtained for patient from [place] for off label use.  Met with patient and her husband for 10 minutes in infusion room after she saw Dr. Jana Hakim.  Reminded patient that the study still wants to follow patient and collect her information even though she is starting on Pembrolizumab.  Patient asked if she "had to" stay on the study.  Informed patient it is voluntary, but her information will be useful.  Patient said she did not want to fill out any more paperwork.  Informed patient she does not have to complete questionnaires, but asked her if study can continue collect her information and data if she didn't have to fill out any paperwork.  Patient then said she just really didn't want to be in the study "at all" if she "doesn't have to."   Confirmed with patient she desires to withdraw consent from study which includes not allowing study to follow her and collect any further information.  Patient signed and dated Withdrawal of Consent form IRB approved 07/27/2018.  Thanked patient for her time.  Dr. Jana Hakim notified of patient's withdrawal from this study.   Completed Off Treatment Notice in [facility] [place].   DCP-001 Use of a Clinical Trial Screening Tool to Address Cancer Health Disparities in the [facility]) Informed patient of the DCP study briefly explaining it is a one time collection of information about patients who were screened for NCI studies.  Patient declined that she did not want to "learn" about any other studies.  Thanked patient for her time.  Foye Spurling, BSN, RN Clinical Research Nurse 11/15/2018 9:30 AM

## 2018-11-15 NOTE — Patient Instructions (Signed)
Pembrolizumab injection  What is this medicine?  PEMBROLIZUMAB (pem broe liz ue mab) is a monoclonal antibody. It is used to treat cervical cancer, esophageal cancer, head and neck cancer, hepatocellular cancer, Hodgkin lymphoma, kidney cancer, lymphoma, melanoma, Merkel cell carcinoma, lung cancer, stomach cancer, urothelial cancer, and cancers that have a certain genetic condition.  This medicine may be used for other purposes; ask your health care provider or pharmacist if you have questions.  COMMON BRAND NAME(S): Keytruda  What should I tell my health care provider before I take this medicine?  They need to know if you have any of these conditions:  -diabetes  -immune system problems  -inflammatory bowel disease  -liver disease  -lung or breathing disease  -lupus  -received or scheduled to receive an organ transplant or a stem-cell transplant that uses donor stem cells  -an unusual or allergic reaction to pembrolizumab, other medicines, foods, dyes, or preservatives  -pregnant or trying to get pregnant  -breast-feeding  How should I use this medicine?  This medicine is for infusion into a vein. It is given by a health care professional in a hospital or clinic setting.  A special MedGuide will be given to you before each treatment. Be sure to read this information carefully each time.  Talk to your pediatrician regarding the use of this medicine in children. While this drug may be prescribed for selected conditions, precautions do apply.  Overdosage: If you think you have taken too much of this medicine contact a poison control center or emergency room at once.  NOTE: This medicine is only for you. Do not share this medicine with others.  What if I miss a dose?  It is important not to miss your dose. Call your doctor or health care professional if you are unable to keep an appointment.  What may interact with this medicine?  Interactions have not been studied.  Give your health care provider a list of all the  medicines, herbs, non-prescription drugs, or dietary supplements you use. Also tell them if you smoke, drink alcohol, or use illegal drugs. Some items may interact with your medicine.  This list may not describe all possible interactions. Give your health care provider a list of all the medicines, herbs, non-prescription drugs, or dietary supplements you use. Also tell them if you smoke, drink alcohol, or use illegal drugs. Some items may interact with your medicine.  What should I watch for while using this medicine?  Your condition will be monitored carefully while you are receiving this medicine.  You may need blood work done while you are taking this medicine.  Do not become pregnant while taking this medicine or for 4 months after stopping it. Women should inform their doctor if they wish to become pregnant or think they might be pregnant. There is a potential for serious side effects to an unborn child. Talk to your health care professional or pharmacist for more information. Do not breast-feed an infant while taking this medicine or for 4 months after the last dose.  What side effects may I notice from receiving this medicine?  Side effects that you should report to your doctor or health care professional as soon as possible:  -allergic reactions like skin rash, itching or hives, swelling of the face, lips, or tongue  -bloody or black, tarry  -breathing problems  -changes in vision  -chest pain  -chills  -confusion  -constipation  -cough  -diarrhea  -dizziness or feeling faint or lightheaded  -  fast or irregular heartbeat  -fever  -flushing  -hair loss  -joint pain  -low blood counts - this medicine may decrease the number of white blood cells, red blood cells and platelets. You may be at increased risk for infections and bleeding.  -muscle pain  -muscle weakness  -persistent headache  -redness, blistering, peeling or loosening of the skin, including inside the mouth  -signs and symptoms of high blood sugar  such as dizziness; dry mouth; dry skin; fruity breath; nausea; stomach pain; increased hunger or thirst; increased urination  -signs and symptoms of kidney injury like trouble passing urine or change in the amount of urine  -signs and symptoms of liver injury like dark urine, light-colored stools, loss of appetite, nausea, right upper belly pain, yellowing of the eyes or skin  -sweating  -swollen lymph nodes  -weight loss  Side effects that usually do not require medical attention (report to your doctor or health care professional if they continue or are bothersome):  -decreased appetite  -muscle pain  -tiredness  This list may not describe all possible side effects. Call your doctor for medical advice about side effects. You may report side effects to FDA at 1-800-FDA-1088.  Where should I keep my medicine?  This drug is given in a hospital or clinic and will not be stored at home.  NOTE: This sheet is a summary. It may not cover all possible information. If you have questions about this medicine, talk to your doctor, pharmacist, or health care provider.   2019 Elsevier/Gold Standard (2018-05-06 15:06:10)

## 2018-11-17 ENCOUNTER — Telehealth: Payer: Self-pay

## 2018-11-17 NOTE — Telephone Encounter (Signed)
Nurse spoke with pt to follow up after first infusion of Keytruda.  Pt states she is feeling fine.  Denies diarrhea, skin rashes, or any changes since yesterday.  Pt denies any questions.

## 2018-11-22 ENCOUNTER — Telehealth: Payer: Self-pay | Admitting: Oncology

## 2018-11-22 ENCOUNTER — Inpatient Hospital Stay: Payer: Medicare Other

## 2018-11-22 ENCOUNTER — Inpatient Hospital Stay: Payer: Medicare Other | Admitting: Oncology

## 2018-11-22 ENCOUNTER — Ambulatory Visit: Payer: Medicare Other

## 2018-11-22 NOTE — Telephone Encounter (Signed)
Called patient per voicemail log.  Patient wanted to cancel her appt for 02/17.  Cancel appt per patient request.

## 2018-11-24 ENCOUNTER — Telehealth: Payer: Self-pay | Admitting: *Deleted

## 2018-11-24 NOTE — Telephone Encounter (Signed)
S1418 Clinical Trial Withdrawal Note: Called patient to clarify her wishes regarding withdawal from the S1418 study. Confirmed patient does not want the study to collect any further information from her chart or medical record.  Asked patient if she wishes to withdraw consent for the study to use her blood and tissue that has already been sent to them for ongoing or future research.  Patient said she agrees for the study to continue to use, or use in the future, any specimens already sent to the study, including blood and tissue. Thanked patient for her time and willingness to discuss this with research nurse today.   Foye Spurling, BSN, RN Clinical Research Nurse 11/24/2018 12:10 PM

## 2018-11-29 DIAGNOSIS — Z9011 Acquired absence of right breast and nipple: Secondary | ICD-10-CM | POA: Diagnosis not present

## 2018-11-29 DIAGNOSIS — Z853 Personal history of malignant neoplasm of breast: Secondary | ICD-10-CM | POA: Diagnosis not present

## 2018-11-29 DIAGNOSIS — C50919 Malignant neoplasm of unspecified site of unspecified female breast: Secondary | ICD-10-CM | POA: Diagnosis not present

## 2018-12-06 ENCOUNTER — Inpatient Hospital Stay: Payer: Medicare Other

## 2018-12-06 ENCOUNTER — Encounter: Payer: Self-pay | Admitting: Adult Health

## 2018-12-06 ENCOUNTER — Inpatient Hospital Stay (HOSPITAL_BASED_OUTPATIENT_CLINIC_OR_DEPARTMENT_OTHER): Payer: Medicare Other | Admitting: Adult Health

## 2018-12-06 ENCOUNTER — Inpatient Hospital Stay: Payer: Medicare Other | Attending: Nurse Practitioner

## 2018-12-06 VITALS — BP 154/82 | HR 76 | Temp 97.6°F | Resp 20 | Ht 62.0 in | Wt 171.2 lb

## 2018-12-06 DIAGNOSIS — Z171 Estrogen receptor negative status [ER-]: Secondary | ICD-10-CM

## 2018-12-06 DIAGNOSIS — Z5112 Encounter for antineoplastic immunotherapy: Secondary | ICD-10-CM | POA: Diagnosis not present

## 2018-12-06 DIAGNOSIS — C50411 Malignant neoplasm of upper-outer quadrant of right female breast: Secondary | ICD-10-CM

## 2018-12-06 DIAGNOSIS — Z95828 Presence of other vascular implants and grafts: Secondary | ICD-10-CM

## 2018-12-06 DIAGNOSIS — Z79899 Other long term (current) drug therapy: Secondary | ICD-10-CM | POA: Diagnosis not present

## 2018-12-06 DIAGNOSIS — R51 Headache: Secondary | ICD-10-CM

## 2018-12-06 LAB — CBC WITH DIFFERENTIAL/PLATELET
Abs Immature Granulocytes: 0.01 10*3/uL (ref 0.00–0.07)
Basophils Absolute: 0 10*3/uL (ref 0.0–0.1)
Basophils Relative: 1 %
EOS ABS: 0.1 10*3/uL (ref 0.0–0.5)
Eosinophils Relative: 2 %
HCT: 35.1 % — ABNORMAL LOW (ref 36.0–46.0)
Hemoglobin: 11.3 g/dL — ABNORMAL LOW (ref 12.0–15.0)
Immature Granulocytes: 0 %
Lymphocytes Relative: 16 %
Lymphs Abs: 0.7 10*3/uL (ref 0.7–4.0)
MCH: 31.8 pg (ref 26.0–34.0)
MCHC: 32.2 g/dL (ref 30.0–36.0)
MCV: 98.9 fL (ref 80.0–100.0)
Monocytes Absolute: 0.7 10*3/uL (ref 0.1–1.0)
Monocytes Relative: 14 %
Neutro Abs: 3 10*3/uL (ref 1.7–7.7)
Neutrophils Relative %: 67 %
Platelets: 191 10*3/uL (ref 150–400)
RBC: 3.55 MIL/uL — ABNORMAL LOW (ref 3.87–5.11)
RDW: 13.9 % (ref 11.5–15.5)
WBC: 4.5 10*3/uL (ref 4.0–10.5)
nRBC: 0 % (ref 0.0–0.2)

## 2018-12-06 LAB — COMPREHENSIVE METABOLIC PANEL
ALT: 12 U/L (ref 0–44)
AST: 15 U/L (ref 15–41)
Albumin: 3.4 g/dL — ABNORMAL LOW (ref 3.5–5.0)
Alkaline Phosphatase: 74 U/L (ref 38–126)
Anion gap: 10 (ref 5–15)
BUN: 12 mg/dL (ref 8–23)
CALCIUM: 8.4 mg/dL — AB (ref 8.9–10.3)
CO2: 23 mmol/L (ref 22–32)
Chloride: 107 mmol/L (ref 98–111)
Creatinine, Ser: 0.8 mg/dL (ref 0.44–1.00)
GFR calc Af Amer: >60 mL/min (ref >60)
Glucose, Bld: 128 mg/dL — ABNORMAL HIGH (ref 70–99)
Potassium: 3.9 mmol/L (ref 3.5–5.1)
Sodium: 140 mmol/L (ref 135–145)
Total Bilirubin: 0.4 mg/dL (ref 0.3–1.2)
Total Protein: 7 g/dL (ref 6.5–8.1)

## 2018-12-06 MED ORDER — HEPARIN SOD (PORK) LOCK FLUSH 100 UNIT/ML IV SOLN
500.0000 [IU] | Freq: Once | INTRAVENOUS | Status: AC | PRN
  Administered 2018-12-06: 500 [IU]
  Filled 2018-12-06: qty 5

## 2018-12-06 MED ORDER — SODIUM CHLORIDE 0.9 % IV SOLN
Freq: Once | INTRAVENOUS | Status: AC
  Administered 2018-12-06: 09:00:00 via INTRAVENOUS
  Filled 2018-12-06: qty 250

## 2018-12-06 MED ORDER — SODIUM CHLORIDE 0.9 % IV SOLN
200.0000 mg | Freq: Once | INTRAVENOUS | Status: AC
  Administered 2018-12-06: 200 mg via INTRAVENOUS
  Filled 2018-12-06: qty 8

## 2018-12-06 MED ORDER — SODIUM CHLORIDE 0.9% FLUSH
10.0000 mL | Freq: Once | INTRAVENOUS | Status: AC
  Administered 2018-12-06: 10 mL
  Filled 2018-12-06: qty 10

## 2018-12-06 MED ORDER — SODIUM CHLORIDE 0.9% FLUSH
10.0000 mL | INTRAVENOUS | Status: DC | PRN
  Administered 2018-12-06: 10 mL
  Filled 2018-12-06: qty 10

## 2018-12-06 NOTE — Progress Notes (Signed)
Veronica Price  [phone] [phone]     ID: Veronica Price DOB: 03-Oct-1946  MR#: [id number]  SVX#:[id number]  Patient Care Team: Patient, No Pcp Per as PCP - General (General Practice) [name], [name], MD as Consulting Physician (Oncology) [name], [name], MD as Consulting Physician (Oncology) [name], MD as Consulting Physician (General Surgery) [name], MD as Radiation Oncologist (Radiation Oncology) Cathlean Cower, RN as Registered Nurse OTHER MD:   CHIEF COMPLAINT: Triple negative breast cancer  CURRENT TREATMENT:  On S1418 study, randomized to observaton; pembrolizumab off study   INTERVAL HISTORY: Veronica Price returns today for follow-up and treatment of her triple negative breast cancer. She is accompanied by her husband.  She is receiving Pembrolizumab  Since her last visit she underwent bilateral mammogram last week.  She had this done at [facility] in [place].  WE do not yet have a copy of the report however it was normal.     REVIEW OF SYSTEMS: Veronica Price is tolerating the Pembrolizumab very well.  She denies any issues.  She has a mild headache this morning that she took aleve for, but this has not been typical for her.  She denies any fevers, chills, bowel/bladder changes nausea or vomiting.  She denies any swelling in either arm, or ROM difficulty.  She hasn't noted any new pain, cough, shortness of breath, chest pain, or palpitations.  A detailed ROS was otherwise non contributory today.     HISTORY OF CURRENT ILLNESS: Veronica Price initially palpated a mass to her right breast in February 2019.   She brought her to her primary care physician's attention and was set up for bilateral diagnostic mammography with tomography and right breast ultrasonography on 11/25/2017 showing: a 3.2 x 3.7 cm lobulated mass in the UOQ of the right breast; there was a 3 cm calcified retroareolar mass in the left  breast. On right breast ultrasonography there was a complex mostly solid 2.4 x 3.1 cm mass at 10 o'clock and multiple enlarged right axillary lymph nodes, the largest measuring 1.6 x 2.4.  Accordingly on 11/27/2017 the patient proceeded to biopsy of the right breast area in question, as well as an attempted axillary node biopsy. The pathology from this procedure showed ([id number]):  high grade infiltrating ductal carcinoma with no lymphovascular invasion.  The attempted axillary node biopsy showed fat, no nodal tissue.  Prognostic indicators were: estrogen and progesterone receptor negative,. Proliferation marker Ki67 at >80%. HER2 negative by immunohistochemistry at 0%  CT scans of the chest abdomen and pelvis completed on 12/10/2017 showed A complex solid and cystic 3.5 cm right breast mass with several bilateral axillary lymph nodes, the largest in the right axilla measured 12 mm. There was no evidence of visceral metastatic disease.   She had a bone scan on 12/10/2017 that showed two areas of increased uptake overlying the lateral aspects of the T11 vertebrae. Review of the CT scan of the chest revealed sclerosis of the T11 pedicles concerning for bone metastases.  However subsequent spinal MRI (I do not have that report) read this out as arthritis, not a metastatic deposit  The patient had a echocardiogram completed on 3/72019 with results showing: left ventricular ejection fraction of 55-60% with mild concentric hypertrophy. There was mild to moderate mitral and mild tricuspid regurgitation with mild pulmonary hypertension. The aortic valve was mildly thickened with trace aortic regurgitation.   The patient's subsequent history is as detailed below.  PAST MEDICAL HISTORY:  Past Medical History:  Diagnosis Date  . Arthritis 2003  . Breast cancer ([place]) 11/25/2017   Right breast  . Cataract 2016   "early" per patient-no surgery yet   . GERD (gastroesophageal reflux disease) 2003   per patient  report  . Hypothyroidism 10/20/2018    PAST SURGICAL HISTORY: Past Surgical History:  Procedure Laterality Date  . ABDOMINAL HYSTERECTOMY  2002   Total hysterectomy in 2002  . BREAST LUMPECTOMY WITH RADIOACTIVE SEED AND SENTINEL LYMPH NODE BIOPSY Right 06/28/2018   Procedure: BREAST LUMPECTOMY WITH RADIOACTIVE SEED AND SENTINEL LYMPH NODE BIOPSY;  Surgeon: [name], MD;  Location: [facility];  Service: General;  Laterality: Right;  . PORTACATH PLACEMENT N/A 12/29/2017   Procedure: INSERTION PORT-A-CATH LEFT SUBCLAVIAN;  Surgeon: [name], MD;  Location: [facility];  Service: General;  Laterality: N/A;    FAMILY HISTORY Family History  Problem Relation Age of Onset  . Diabetes Father   . Heart attack Father    She notes that her father died from a possible MI in his early 73's Her mother is still alive and will be 76 March 2019!  The patient has 2 brothers and 2 sisters. Her brother had prostate cancer possibly agent orange related. She has a cousin who was recently diagnosed with stomach cancer. Her maternal grandmother died from colon cancer. She has a maternal uncle with bone cancer. She denies family hx of breast or ovarian cancer.    GYNECOLOGIC HISTORY:  Menarche: 73 years old Age at first live birth: 72 years old [place] P3 LMP: at age 54 Contraceptive: OCP HRT   Hysterectomy? 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 She also worked at [employer]. She lives at home with her husband, Veronica Price who is a retired Building control surveyor, her husband works part time driving an adult day care bus. Her daughter, Veronica Price is a Multimedia programmer. Her daughter Veronica Price lives in [place], [place] and works as a Therapist, sports for the [place]. Her son Veronica Price, lives in [place], and works as a Administrator.  The patient has 3 grandchildren and no great-grandchildren. She is of baptist faith.     ADVANCED DIRECTIVES:    HEALTH MAINTENANCE: Social History   Tobacco Use  . Smoking status: Never Smoker  . Smokeless tobacco: Never Used  Substance Use Topics  . Alcohol use: Never    Frequency: Never  . Drug use: Never     Colonoscopy: UTD; last year (2018)  PAP:  Bone density:    Allergies  Allergen Reactions  . Prednisone Swelling and Other (See Comments)    " I think it was this that made my tongue swell "    Current Outpatient Medications  Medication Sig Dispense Refill  . levothyroxine (SYNTHROID, LEVOTHROID) 25 MCG tablet Take 1 tablet (25 mcg total) by mouth daily before breakfast. 90 tablet 3  . lidocaine-prilocaine (EMLA) cream Apply 1 application topically as needed. 30 g 0  . naproxen sodium (ALEVE) 220 MG tablet Take 220 mg by mouth daily as needed (for pain).     No current facility-administered medications for this visit.    Facility-Administered Medications Ordered in Other Visits  Medication Dose Route Frequency Provider Last Rate Last Dose  . heparin lock flush 100 unit/mL  250 Units Intracatheter Once [name], [name], MD        OBJECTIVE:  Vitals:   12/06/18 0812  BP: (!) 154/82  Pulse: 76  Resp: 20  Temp: 97.6 F (36.4 C)  SpO2: 100%     Body mass index is 31.31 kg/m.   Wt Readings from Last 3 Encounters:  12/06/18 171 lb 3.2 oz (77.7 kg)  11/15/18 168 lb 9.6 oz (76.5 kg)  10/19/18 169 lb 3.2 oz (76.7 kg)  ECOG FS:1 - Symptomatic but completely ambulatory  GENERAL: Patient is a well appearing female in no acute distress HEENT:  Sclerae anicteric.  Oropharynx clear and moist. No ulcerations or evidence of oropharyngeal candidiasis. Neck is supple.  NODES:  No cervical, supraclavicular, or axillary lymphadenopathy palpated.  BREAST EXAM:  Right breast s/p lumpectomy and radiation, no sign of local recurrence, left breast benign LUNGS:  Clear to auscultation bilaterally.  No wheezes or rhonchi. HEART:  Regular rate and rhythm. No  murmur appreciated. ABDOMEN:  Soft, nontender.  Positive, normoactive bowel sounds. No organomegaly palpated. MSK:  No focal spinal tenderness to palpation. Full range of motion bilaterally in the upper extremities. EXTREMITIES:  No peripheral edema.   SKIN:  Clear with no obvious rashes or skin changes. No nail dyscrasia. NEURO:  Nonfocal. Well oriented.  Appropriate affect.     LAB RESULTS:  CMP     Component Value Date/Time   NA 140 12/06/2018 0739   K 3.9 12/06/2018 0739   CL 107 12/06/2018 0739   CO2 23 12/06/2018 0739   GLUCOSE 128 (H) 12/06/2018 0739   BUN 12 12/06/2018 0739   CREATININE 0.80 12/06/2018 0739   CREATININE 0.80 01/11/2018 0747   CALCIUM 8.4 (L) 12/06/2018 0739   PROT 7.0 12/06/2018 0739   ALBUMIN 3.4 (L) 12/06/2018 0739   AST 15 12/06/2018 0739   AST 17 01/11/2018 0747   ALT 12 12/06/2018 0739   ALT 16 01/11/2018 0747   ALKPHOS 74 12/06/2018 0739   BILITOT 0.4 12/06/2018 0739   BILITOT 0.6 01/11/2018 0747   GFRNONAA >60 12/06/2018 0739   GFRNONAA >60 01/11/2018 0747   GFRAA >60 12/06/2018 0739   GFRAA >60 01/11/2018 0747    No results found for: TOTALPROTELP, ALBUMINELP, A1GS, A2GS, BETS, BETA2SER, GAMS, MSPIKE, SPEI  No results found for: KPAFRELGTCHN, LAMBDASER, [facility]  Lab Results  Component Value Date   WBC 4.5 12/06/2018   NEUTROABS 3.0 12/06/2018   HGB 11.3 (L) 12/06/2018   HCT 35.1 (L) 12/06/2018   MCV 98.9 12/06/2018   PLT 191 12/06/2018    _0 @  No results found for: LABCA2  No components found for: DPOEUM353  No results for input(s): INR in the last 168 hours.  No results found for: LABCA2  No results found for: IRW431  No results found for: VQM086  No results found for: PYP950  No results found for: CA2729  No components found for: HGQUANT  No results found for: CEA1 / No results found for: CEA1   No results found for: AFPTUMOR  No results found for: CHROMOGRNA  No results found for:  PSA1  Appointment on 12/06/2018  Component Date Value Ref Range Status  . WBC 12/06/2018 4.5  4.0 - 10.5 K/uL Final  . RBC 12/06/2018 3.55* 3.87 - 5.11 MIL/uL Final  . Hemoglobin 12/06/2018 11.3* 12.0 - 15.0 g/dL Final  . HCT 12/06/2018 35.1* 36.0 - 46.0 % Final  . MCV 12/06/2018 98.9  80.0 - 100.0 fL Final  . MCH 12/06/2018 31.8  26.0 - 34.0 pg Final  . MCHC 12/06/2018 32.2  30.0 -  36.0 g/dL Final  . RDW 12/06/2018 13.9  11.5 - 15.5 % Final  . Platelets 12/06/2018 191  150 - 400 K/uL Final  . nRBC 12/06/2018 0.0  0.0 - 0.2 % Final  . Neutrophils Relative % 12/06/2018 67  % Final  . Neutro Abs 12/06/2018 3.0  1.7 - 7.7 K/uL Final  . Lymphocytes Relative 12/06/2018 16  % Final  . Lymphs Abs 12/06/2018 0.7  0.7 - 4.0 K/uL Final  . Monocytes Relative 12/06/2018 14  % Final  . Monocytes Absolute 12/06/2018 0.7  0.1 - 1.0 K/uL Final  . Eosinophils Relative 12/06/2018 2  % Final  . Eosinophils Absolute 12/06/2018 0.1  0.0 - 0.5 K/uL Final  . Basophils Relative 12/06/2018 1  % Final  . Basophils Absolute 12/06/2018 0.0  0.0 - 0.1 K/uL Final  . Immature Granulocytes 12/06/2018 0  % Final  . Abs Immature Granulocytes 12/06/2018 0.01  0.00 - 0.07 K/uL Final   Performed at [facility], [place] [address], [place], [place] [address]  . Sodium 12/06/2018 140  135 - 145 mmol/L Final  . Potassium 12/06/2018 3.9  3.5 - 5.1 mmol/L Final  . Chloride 12/06/2018 107  98 - 111 mmol/L Final  . CO2 12/06/2018 23  22 - 32 mmol/L Final  . Glucose, Bld 12/06/2018 128* 70 - 99 mg/dL Final  . BUN 12/06/2018 12  8 - 23 mg/dL Final  . Creatinine, Ser 12/06/2018 0.80  0.44 - 1.00 mg/dL Final  . Calcium 12/06/2018 8.4* 8.9 - 10.3 mg/dL Final  . Total Protein 12/06/2018 7.0  6.5 - 8.1 g/dL Final  . Albumin 12/06/2018 3.4* 3.5 - 5.0 g/dL Final  . AST 12/06/2018 15  15 - 41 U/L Final  . ALT 12/06/2018 12  0 - 44 U/L Final  . Alkaline Phosphatase 12/06/2018 74  38 - 126 U/L Final  . Total  Bilirubin 12/06/2018 0.4  0.3 - 1.2 mg/dL Final  . GFR calc non Af Amer 12/06/2018 >60  >60 mL/min Final  . GFR calc Af Amer 12/06/2018 >60  >60 mL/min Final  . Anion gap 12/06/2018 10  5 - 15 Final   Performed at [facility], [place] [address] [place], [place] [address]    (this displays the last labs from the last 3 days)  No results found for: TOTALPROTELP, ALBUMINELP, A1GS, A2GS, BETS, BETA2SER, GAMS, MSPIKE, SPEI (this displays SPEP labs)  No results found for: KPAFRELGTCHN, LAMBDASER, KAPLAMBRATIO (kappa/lambda light chains)  No results found for: HGBA, HGBA2QUANT, HGBFQUANT, HGBSQUAN (Hemoglobinopathy evaluation)   No results found for: LDH  No results found for: IRON, TIBC, IRONPCTSAT (Iron and TIBC)  No results found for: FERRITIN  Urinalysis No results found for: COLORURINE, APPEARANCEUR, LABSPEC, PHURINE, GLUCOSEU, HGBUR, BILIRUBINUR, KETONESUR, PROTEINUR, UROBILINOGEN, NITRITE, LEUKOCYTESUR   STUDIES: No results found.  ELIGIBLE FOR AVAILABLE RESEARCH PROTOCOL:  SWOG [phone] (observation arm)  ASSESSMENT: 73 y.o. [name], [place] woman status post right breast upper outer quadrant biopsy 11/25/2017 for a clinical T2 N2, stage IIIc invasive ductal carcinoma, grade 3, triple negative, with an MIB-1 of 80%.  (1) staging studies:  (a) chest CT 12/10/2017 scan showed no visceral metastatic disease  (b) bone scan 12/10/2017 showed uptake at T11, with sclerosis.  (c) CA-27-29 on 12/05/2017 was 19.0  (d) spinal MRI in [place] reportedly showed only arthrtis at T11  (2) neoadjuvant chemotherapy consisting of carboplatin/ paclitaxel x 12 (received 9) started 01/04/2018, discontinued 03/15/2018, followed by cyclophosphamide  and doxorubicin given every 3 weeks x 4 starting 04/05/2018, completed 06/14/2018  (a) carboplatin/paclitaxel changed to carboplatin/gemcitabine after 8 cycles due to neuroapthy  (b) only able to tolerate 1 cycle of  Gemcitabine/Carboplatin due to rash  (a) echocardiogram on 04/02/18 demonstrates a LVEF of 60-65%  (3) status post right lumpectomy and sentinel lymph node sampling on 06/28/2018 showing a residual 2.2 cm, grade 3 invasive ductal carcinoma, with negative margins and 0 of 3 sentinel lymph nodes removed involved (ypT2 ypN0)  (4) adjuvant radiation through the [facility] group in [place] mid November through mid December 2019  (5) participating in the pembrolizumab study S1418, randomized to observation  (6) pembrolizumab started (not as part of study) 11/15/2018, to be repeated every 21 days for 1 year  PLAN:   Veronica Price is doing well today.  She has no clinical sign of recurrence.  She is tolerating her treatment with Pembrolizumab well.  We are checking her TSH every 6 weeks.  Her labs are stable today and I reviewed them with her in detail.  We talked about her calcium intake and reviewed foods high in calcium.  We will call [facility], [place] [phone] to get her mammogram faxed over to [place].    Veronica Price will return every 3 weeks for labs and Pembrolizumab.  We will see her again in May, 2020 with her treatment.  She knows to call for any other issue that may develop before the next visit.  A total of (20) minutes of face-to-face time was spent with this patient with greater than 50% of that time in counseling and care-coordination.   Wilber Bihari, NP  12/06/18 8:38 AM Medical Oncology and Hematology [facility] [address] [place], [place] [address] Tel. [phone]    Fax. [phone]

## 2018-12-06 NOTE — Patient Instructions (Signed)
[facility] Discharge Instructions for Patients Receiving Chemotherapy  Today you received the following chemotherapy agents: Pembrolizumab (Keytruda)  To help prevent nausea and vomiting after your treatment, we encourage you to take your nausea medication as directed.    If you develop nausea and vomiting that is not controlled by your nausea medication, call the clinic.   BELOW ARE SYMPTOMS THAT SHOULD BE REPORTED IMMEDIATELY:  *FEVER GREATER THAN 100.5 F  *CHILLS WITH OR WITHOUT FEVER  NAUSEA AND VOMITING THAT IS NOT CONTROLLED WITH YOUR NAUSEA MEDICATION  *UNUSUAL SHORTNESS OF BREATH  *UNUSUAL BRUISING OR BLEEDING  TENDERNESS IN MOUTH AND THROAT WITH OR WITHOUT PRESENCE OF ULCERS  *URINARY PROBLEMS  *BOWEL PROBLEMS  UNUSUAL RASH Items with * indicate a potential emergency and should be followed up as soon as possible.  Feel free to call the clinic should you have any questions or concerns. The clinic phone number is [phone].  Please show the CHEMO ALERT CARD at check-in to the Emergency Department and triage nurse. 

## 2018-12-23 ENCOUNTER — Other Ambulatory Visit: Payer: Self-pay | Admitting: Oncology

## 2018-12-27 ENCOUNTER — Inpatient Hospital Stay: Payer: Medicare Other

## 2018-12-27 ENCOUNTER — Other Ambulatory Visit: Payer: Self-pay

## 2018-12-27 VITALS — BP 146/78 | HR 63 | Temp 98.0°F | Resp 18

## 2018-12-27 DIAGNOSIS — C50411 Malignant neoplasm of upper-outer quadrant of right female breast: Secondary | ICD-10-CM

## 2018-12-27 DIAGNOSIS — Z5112 Encounter for antineoplastic immunotherapy: Secondary | ICD-10-CM | POA: Diagnosis not present

## 2018-12-27 DIAGNOSIS — Z79899 Other long term (current) drug therapy: Secondary | ICD-10-CM | POA: Diagnosis not present

## 2018-12-27 DIAGNOSIS — Z171 Estrogen receptor negative status [ER-]: Principal | ICD-10-CM

## 2018-12-27 DIAGNOSIS — Z95828 Presence of other vascular implants and grafts: Secondary | ICD-10-CM

## 2018-12-27 DIAGNOSIS — E039 Hypothyroidism, unspecified: Secondary | ICD-10-CM

## 2018-12-27 LAB — CBC WITH DIFFERENTIAL/PLATELET
Abs Immature Granulocytes: 0.01 10*3/uL (ref 0.00–0.07)
Basophils Absolute: 0 10*3/uL (ref 0.0–0.1)
Basophils Relative: 1 %
Eosinophils Absolute: 0.1 10*3/uL (ref 0.0–0.5)
Eosinophils Relative: 3 %
HCT: 37.8 % (ref 36.0–46.0)
Hemoglobin: 12.1 g/dL (ref 12.0–15.0)
Immature Granulocytes: 0 %
Lymphocytes Relative: 20 %
Lymphs Abs: 0.9 10*3/uL (ref 0.7–4.0)
MCH: 31.8 pg (ref 26.0–34.0)
MCHC: 32 g/dL (ref 30.0–36.0)
MCV: 99.5 fL (ref 80.0–100.0)
Monocytes Absolute: 0.7 10*3/uL (ref 0.1–1.0)
Monocytes Relative: 15 %
Neutro Abs: 2.7 10*3/uL (ref 1.7–7.7)
Neutrophils Relative %: 61 %
Platelets: 201 10*3/uL (ref 150–400)
RBC: 3.8 MIL/uL — ABNORMAL LOW (ref 3.87–5.11)
RDW: 13.9 % (ref 11.5–15.5)
WBC: 4.5 10*3/uL (ref 4.0–10.5)
nRBC: 0 % (ref 0.0–0.2)

## 2018-12-27 LAB — COMPREHENSIVE METABOLIC PANEL
ALT: 19 U/L (ref 0–44)
AST: 20 U/L (ref 15–41)
Albumin: 3.6 g/dL (ref 3.5–5.0)
Alkaline Phosphatase: 66 U/L (ref 38–126)
Anion gap: 10 (ref 5–15)
BUN: 11 mg/dL (ref 8–23)
CO2: 22 mmol/L (ref 22–32)
Calcium: 8.7 mg/dL — ABNORMAL LOW (ref 8.9–10.3)
Chloride: 107 mmol/L (ref 98–111)
Creatinine, Ser: 0.82 mg/dL (ref 0.44–1.00)
GFR calc Af Amer: >60 mL/min (ref >60)
GFR calc non Af Amer: >60 mL/min (ref >60)
Glucose, Bld: 99 mg/dL (ref 70–99)
Potassium: 4.1 mmol/L (ref 3.5–5.1)
Sodium: 139 mmol/L (ref 135–145)
Total Bilirubin: 0.5 mg/dL (ref 0.3–1.2)
Total Protein: 7.6 g/dL (ref 6.5–8.1)

## 2018-12-27 LAB — TSH: TSH: 4.211 u[IU]/mL — ABNORMAL HIGH (ref 0.308–3.960)

## 2018-12-27 MED ORDER — HEPARIN SOD (PORK) LOCK FLUSH 100 UNIT/ML IV SOLN
500.0000 [IU] | Freq: Once | INTRAVENOUS | Status: AC | PRN
  Administered 2018-12-27: 500 [IU]
  Filled 2018-12-27: qty 5

## 2018-12-27 MED ORDER — SODIUM CHLORIDE 0.9 % IV SOLN
Freq: Once | INTRAVENOUS | Status: AC
  Administered 2018-12-27: 09:00:00 via INTRAVENOUS
  Filled 2018-12-27: qty 250

## 2018-12-27 MED ORDER — SODIUM CHLORIDE 0.9% FLUSH
10.0000 mL | INTRAVENOUS | Status: DC | PRN
  Administered 2018-12-27: 10 mL
  Filled 2018-12-27: qty 10

## 2018-12-27 MED ORDER — SODIUM CHLORIDE 0.9% FLUSH
10.0000 mL | Freq: Once | INTRAVENOUS | Status: AC
  Administered 2018-12-27: 10 mL
  Filled 2018-12-27: qty 10

## 2018-12-27 MED ORDER — SODIUM CHLORIDE 0.9 % IV SOLN
200.0000 mg | Freq: Once | INTRAVENOUS | Status: AC
  Administered 2018-12-27: 200 mg via INTRAVENOUS
  Filled 2018-12-27: qty 8

## 2018-12-27 NOTE — Patient Instructions (Signed)
[facility] Discharge Instructions for Patients Receiving Chemotherapy  Today you received the following chemotherapy agents: Pembrolizumab (Keytruda)  To help prevent nausea and vomiting after your treatment, we encourage you to take your nausea medication as directed.    If you develop nausea and vomiting that is not controlled by your nausea medication, call the clinic.   BELOW ARE SYMPTOMS THAT SHOULD BE REPORTED IMMEDIATELY:  *FEVER GREATER THAN 100.5 F  *CHILLS WITH OR WITHOUT FEVER  NAUSEA AND VOMITING THAT IS NOT CONTROLLED WITH YOUR NAUSEA MEDICATION  *UNUSUAL SHORTNESS OF BREATH  *UNUSUAL BRUISING OR BLEEDING  TENDERNESS IN MOUTH AND THROAT WITH OR WITHOUT PRESENCE OF ULCERS  *URINARY PROBLEMS  *BOWEL PROBLEMS  UNUSUAL RASH Items with * indicate a potential emergency and should be followed up as soon as possible.  Feel free to call the clinic should you have any questions or concerns. The clinic phone number is [phone].  Please show the CHEMO ALERT CARD at check-in to the Emergency Department and triage nurse. 

## 2019-01-12 ENCOUNTER — Encounter: Payer: Self-pay | Admitting: General Practice

## 2019-01-12 NOTE — Progress Notes (Signed)
[facility] Team contacted patient to assess for food insecurity and other psychosocial needs during current COVID19 pandemic.  Unable to leave VM,  Unidentified VM message.    Beverely Pace, [place]

## 2019-01-17 ENCOUNTER — Inpatient Hospital Stay: Payer: Medicare Other

## 2019-01-17 ENCOUNTER — Other Ambulatory Visit: Payer: Self-pay

## 2019-01-17 ENCOUNTER — Inpatient Hospital Stay: Payer: Medicare Other | Attending: Nurse Practitioner

## 2019-01-17 VITALS — BP 149/79 | HR 60 | Temp 97.7°F | Resp 18

## 2019-01-17 DIAGNOSIS — Z171 Estrogen receptor negative status [ER-]: Secondary | ICD-10-CM

## 2019-01-17 DIAGNOSIS — Z95828 Presence of other vascular implants and grafts: Secondary | ICD-10-CM

## 2019-01-17 DIAGNOSIS — Z5112 Encounter for antineoplastic immunotherapy: Secondary | ICD-10-CM | POA: Insufficient documentation

## 2019-01-17 DIAGNOSIS — C50411 Malignant neoplasm of upper-outer quadrant of right female breast: Secondary | ICD-10-CM | POA: Insufficient documentation

## 2019-01-17 LAB — COMPREHENSIVE METABOLIC PANEL
ALT: 12 U/L (ref 0–44)
AST: 20 U/L (ref 15–41)
Albumin: 3.6 g/dL (ref 3.5–5.0)
Alkaline Phosphatase: 70 U/L (ref 38–126)
Anion gap: 10 (ref 5–15)
BUN: 16 mg/dL (ref 8–23)
CO2: 24 mmol/L (ref 22–32)
Calcium: 8.7 mg/dL — ABNORMAL LOW (ref 8.9–10.3)
Chloride: 106 mmol/L (ref 98–111)
Creatinine, Ser: 1.22 mg/dL — ABNORMAL HIGH (ref 0.44–1.00)
GFR calc Af Amer: 51 mL/min — ABNORMAL LOW (ref >60)
GFR calc non Af Amer: 44 mL/min — ABNORMAL LOW (ref >60)
Glucose, Bld: 79 mg/dL (ref 70–99)
Potassium: 4 mmol/L (ref 3.5–5.1)
Sodium: 140 mmol/L (ref 135–145)
Total Bilirubin: 0.4 mg/dL (ref 0.3–1.2)
Total Protein: 7.5 g/dL (ref 6.5–8.1)

## 2019-01-17 LAB — CBC WITH DIFFERENTIAL/PLATELET
Abs Immature Granulocytes: 0 10*3/uL (ref 0.00–0.07)
Basophils Absolute: 0 10*3/uL (ref 0.0–0.1)
Basophils Relative: 1 %
Eosinophils Absolute: 0.1 10*3/uL (ref 0.0–0.5)
Eosinophils Relative: 3 %
HCT: 36.6 % (ref 36.0–46.0)
Hemoglobin: 11.9 g/dL — ABNORMAL LOW (ref 12.0–15.0)
Immature Granulocytes: 0 %
Lymphocytes Relative: 24 %
Lymphs Abs: 0.9 10*3/uL (ref 0.7–4.0)
MCH: 32.7 pg (ref 26.0–34.0)
MCHC: 32.5 g/dL (ref 30.0–36.0)
MCV: 100.5 fL — ABNORMAL HIGH (ref 80.0–100.0)
Monocytes Absolute: 0.5 10*3/uL (ref 0.1–1.0)
Monocytes Relative: 15 %
Neutro Abs: 2.1 10*3/uL (ref 1.7–7.7)
Neutrophils Relative %: 57 %
Platelets: 220 10*3/uL (ref 150–400)
RBC: 3.64 MIL/uL — ABNORMAL LOW (ref 3.87–5.11)
RDW: 14.1 % (ref 11.5–15.5)
WBC: 3.7 10*3/uL — ABNORMAL LOW (ref 4.0–10.5)
nRBC: 0 % (ref 0.0–0.2)

## 2019-01-17 MED ORDER — SODIUM CHLORIDE 0.9% FLUSH
10.0000 mL | INTRAVENOUS | Status: DC | PRN
  Administered 2019-01-17: 10 mL
  Filled 2019-01-17: qty 10

## 2019-01-17 MED ORDER — SODIUM CHLORIDE 0.9 % IV SOLN
Freq: Once | INTRAVENOUS | Status: AC
  Administered 2019-01-17: 09:00:00 via INTRAVENOUS
  Filled 2019-01-17: qty 250

## 2019-01-17 MED ORDER — HEPARIN SOD (PORK) LOCK FLUSH 100 UNIT/ML IV SOLN
500.0000 [IU] | Freq: Once | INTRAVENOUS | Status: AC | PRN
  Administered 2019-01-17: 500 [IU]
  Filled 2019-01-17: qty 5

## 2019-01-17 MED ORDER — SODIUM CHLORIDE 0.9 % IV SOLN
200.0000 mg | Freq: Once | INTRAVENOUS | Status: AC
  Administered 2019-01-17: 200 mg via INTRAVENOUS
  Filled 2019-01-17: qty 8

## 2019-01-17 MED ORDER — SODIUM CHLORIDE 0.9% FLUSH
10.0000 mL | Freq: Once | INTRAVENOUS | Status: AC
  Administered 2019-01-17: 10 mL
  Filled 2019-01-17: qty 10

## 2019-01-17 NOTE — Patient Instructions (Signed)
[facility] Discharge Instructions for Patients Receiving Chemotherapy  Today you received the following chemotherapy agents: Pembrolizumab (Keytruda)  To help prevent nausea and vomiting after your treatment, we encourage you to take your nausea medication as directed.    If you develop nausea and vomiting that is not controlled by your nausea medication, call the clinic.   BELOW ARE SYMPTOMS THAT SHOULD BE REPORTED IMMEDIATELY:  *FEVER GREATER THAN 100.5 F  *CHILLS WITH OR WITHOUT FEVER  NAUSEA AND VOMITING THAT IS NOT CONTROLLED WITH YOUR NAUSEA MEDICATION  *UNUSUAL SHORTNESS OF BREATH  *UNUSUAL BRUISING OR BLEEDING  TENDERNESS IN MOUTH AND THROAT WITH OR WITHOUT PRESENCE OF ULCERS  *URINARY PROBLEMS  *BOWEL PROBLEMS  UNUSUAL RASH Items with * indicate a potential emergency and should be followed up as soon as possible.  Feel free to call the clinic should you have any questions or concerns. The clinic phone number is [phone].  Please show the CHEMO ALERT CARD at check-in to the Emergency Department and triage nurse. 

## 2019-02-08 ENCOUNTER — Inpatient Hospital Stay (HOSPITAL_BASED_OUTPATIENT_CLINIC_OR_DEPARTMENT_OTHER): Payer: Medicare Other | Admitting: Adult Health

## 2019-02-08 ENCOUNTER — Encounter: Payer: Self-pay | Admitting: Adult Health

## 2019-02-08 ENCOUNTER — Telehealth: Payer: Self-pay | Admitting: *Deleted

## 2019-02-08 ENCOUNTER — Inpatient Hospital Stay: Payer: Medicare Other

## 2019-02-08 ENCOUNTER — Other Ambulatory Visit: Payer: Self-pay

## 2019-02-08 ENCOUNTER — Telehealth: Payer: Self-pay

## 2019-02-08 ENCOUNTER — Other Ambulatory Visit: Payer: Self-pay | Admitting: Adult Health

## 2019-02-08 ENCOUNTER — Inpatient Hospital Stay: Payer: Medicare Other | Attending: Nurse Practitioner

## 2019-02-08 VITALS — BP 159/67 | HR 58 | Temp 97.8°F | Resp 17 | Ht 62.0 in | Wt 176.2 lb

## 2019-02-08 DIAGNOSIS — C50411 Malignant neoplasm of upper-outer quadrant of right female breast: Secondary | ICD-10-CM

## 2019-02-08 DIAGNOSIS — Z171 Estrogen receptor negative status [ER-]: Principal | ICD-10-CM

## 2019-02-08 DIAGNOSIS — Z79899 Other long term (current) drug therapy: Secondary | ICD-10-CM | POA: Insufficient documentation

## 2019-02-08 DIAGNOSIS — Z5112 Encounter for antineoplastic immunotherapy: Secondary | ICD-10-CM | POA: Insufficient documentation

## 2019-02-08 DIAGNOSIS — Z95828 Presence of other vascular implants and grafts: Secondary | ICD-10-CM

## 2019-02-08 DIAGNOSIS — E039 Hypothyroidism, unspecified: Secondary | ICD-10-CM

## 2019-02-08 LAB — CMP (CANCER CENTER ONLY)
ALT: 13 U/L (ref 0–44)
AST: 17 U/L (ref 15–41)
Albumin: 3.8 g/dL (ref 3.5–5.0)
Alkaline Phosphatase: 73 U/L (ref 38–126)
Anion gap: 9 (ref 5–15)
BUN: 10 mg/dL (ref 8–23)
CO2: 26 mmol/L (ref 22–32)
Calcium: 9 mg/dL (ref 8.9–10.3)
Chloride: 105 mmol/L (ref 98–111)
Creatinine: 0.89 mg/dL (ref 0.44–1.00)
GFR, Est AFR Am: >60 mL/min (ref >60)
GFR, Estimated: >60 mL/min (ref >60)
Glucose, Bld: 87 mg/dL (ref 70–99)
Potassium: 4.2 mmol/L (ref 3.5–5.1)
Sodium: 140 mmol/L (ref 135–145)
Total Bilirubin: 0.3 mg/dL (ref 0.3–1.2)
Total Protein: 7.8 g/dL (ref 6.5–8.1)

## 2019-02-08 LAB — CBC WITH DIFFERENTIAL/PLATELET
Abs Immature Granulocytes: 0.01 10*3/uL (ref 0.00–0.07)
Basophils Absolute: 0 10*3/uL (ref 0.0–0.1)
Basophils Relative: 1 %
Eosinophils Absolute: 0.1 10*3/uL (ref 0.0–0.5)
Eosinophils Relative: 2 %
HCT: 38.9 % (ref 36.0–46.0)
Hemoglobin: 12.7 g/dL (ref 12.0–15.0)
Immature Granulocytes: 0 %
Lymphocytes Relative: 23 %
Lymphs Abs: 1 10*3/uL (ref 0.7–4.0)
MCH: 32.2 pg (ref 26.0–34.0)
MCHC: 32.6 g/dL (ref 30.0–36.0)
MCV: 98.7 fL (ref 80.0–100.0)
Monocytes Absolute: 0.5 10*3/uL (ref 0.1–1.0)
Monocytes Relative: 12 %
Neutro Abs: 2.6 10*3/uL (ref 1.7–7.7)
Neutrophils Relative %: 62 %
Platelets: 213 10*3/uL (ref 150–400)
RBC: 3.94 MIL/uL (ref 3.87–5.11)
RDW: 14.5 % (ref 11.5–15.5)
WBC: 4.2 10*3/uL (ref 4.0–10.5)
nRBC: 0 % (ref 0.0–0.2)

## 2019-02-08 LAB — TSH: TSH: 14.475 u[IU]/mL — ABNORMAL HIGH (ref 0.308–3.960)

## 2019-02-08 MED ORDER — SODIUM CHLORIDE 0.9% FLUSH
10.0000 mL | Freq: Once | INTRAVENOUS | Status: AC
  Administered 2019-02-08: 10 mL
  Filled 2019-02-08: qty 10

## 2019-02-08 MED ORDER — SODIUM CHLORIDE 0.9 % IV SOLN
Freq: Once | INTRAVENOUS | Status: AC
  Administered 2019-02-08: 10:00:00 via INTRAVENOUS
  Filled 2019-02-08: qty 250

## 2019-02-08 MED ORDER — SODIUM CHLORIDE 0.9% FLUSH
10.0000 mL | INTRAVENOUS | Status: DC | PRN
  Administered 2019-02-08: 11:00:00 10 mL
  Filled 2019-02-08: qty 10

## 2019-02-08 MED ORDER — HEPARIN SOD (PORK) LOCK FLUSH 100 UNIT/ML IV SOLN
500.0000 [IU] | Freq: Once | INTRAVENOUS | Status: AC | PRN
  Administered 2019-02-08: 11:00:00 500 [IU]
  Filled 2019-02-08: qty 5

## 2019-02-08 MED ORDER — LEVOTHYROXINE SODIUM 50 MCG PO TABS: 50.0000 ug | ORAL_TABLET | Freq: Every day | ORAL | 1 refills | Status: DC

## 2019-02-08 MED ORDER — SODIUM CHLORIDE 0.9 % IV SOLN
200.0000 mg | Freq: Once | INTRAVENOUS | Status: AC
  Administered 2019-02-08: 11:00:00 200 mg via INTRAVENOUS
  Filled 2019-02-08: qty 8

## 2019-02-08 NOTE — Patient Instructions (Signed)

## 2019-02-08 NOTE — Progress Notes (Signed)
[facility]  [phone] Fax:(336) [phone]     ID: CLEVIE PROUT DOB: Dec 13, 1945  MR#: [id number]  OFB#:[id number]  Patient Care Team: Patient, No Pcp Per as PCP - General (General Practice) [name], [name], MD as Consulting Physician (Oncology) [name], [name], MD as Consulting Physician (Oncology) [name], MD as Consulting Physician (General Surgery) [name], MD as Radiation Oncologist (Radiation Oncology) Cathlean Cower, RN as Registered Nurse OTHER MD:   CHIEF COMPLAINT: Triple negative breast cancer  CURRENT TREATMENT:  On S1418 study, randomized to observaton; pembrolizumab off study   INTERVAL HISTORY: Veronica Price returns today for follow-up and treatment of her triple negative breast cancer. She is unaccompanied today.  She is receiving Pembrolizumab every three weeks with a dose due today.  She note she continues to tolerate it well.  She has been started on Synthroid due to elevated TSH.  She is tolerating this well.    REVIEW OF SYSTEMS: Shawnte is feeling well today.  She denies any new issues such as fever, chills, pain, appetite change, weight loss, cough, shortness of breath, chest pain, palpitations, bowel/bladder changes, or nausea vomiting.  She remains fairly active and notes she enjoys to cook.  Otherwise, a detailed ROS was non contributory today.    HISTORY OF CURRENT ILLNESS: Veronica Price initially palpated a mass to her right breast in February 2019.   She brought her to her primary care physician's attention and was set up for bilateral diagnostic mammography with tomography and right breast ultrasonography on 11/25/2017 showing: a 3.2 x 3.7 cm lobulated mass in the UOQ of the right breast; there was a 3 cm calcified retroareolar mass in the left breast. On right breast ultrasonography there was a complex mostly solid 2.4 x 3.1 cm mass at 10 o'clock and multiple enlarged right axillary lymph nodes,  the largest measuring 1.6 x 2.4.  Accordingly on 11/27/2017 the patient proceeded to biopsy of the right breast area in question, as well as an attempted axillary node biopsy. The pathology from this procedure showed ([id number]):  high grade infiltrating ductal carcinoma with no lymphovascular invasion.  The attempted axillary node biopsy showed fat, no nodal tissue.  Prognostic indicators were: estrogen and progesterone receptor negative,. Proliferation marker Ki67 at >80%. HER2 negative by immunohistochemistry at 0%  CT scans of the chest abdomen and pelvis completed on 12/10/2017 showed A complex solid and cystic 3.5 cm right breast mass with several bilateral axillary lymph nodes, the largest in the right axilla measured 12 mm. There was no evidence of visceral metastatic disease.   She had a bone scan on 12/10/2017 that showed two areas of increased uptake overlying the lateral aspects of the T11 vertebrae. Review of the CT scan of the chest revealed sclerosis of the T11 pedicles concerning for bone metastases.  However subsequent spinal MRI (I do not have that report) read this out as arthritis, not a metastatic deposit  The patient had a echocardiogram completed on 3/72019 with results showing: left ventricular ejection fraction of 55-60% with mild concentric hypertrophy. There was mild to moderate mitral and mild tricuspid regurgitation with mild pulmonary hypertension. The aortic valve was mildly thickened with trace aortic regurgitation.   The patient's subsequent history is as detailed below.  PAST MEDICAL HISTORY: Past Medical History:  Diagnosis Date   Arthritis 2003   Breast cancer ([place]) 11/25/2017   Right breast   Cataract 2016   "early" per patient-no surgery yet  GERD (gastroesophageal reflux disease) 2003   per patient report   Hypothyroidism 10/20/2018    PAST SURGICAL HISTORY: Past Surgical History:  Procedure Laterality Date   ABDOMINAL HYSTERECTOMY  2002    Total hysterectomy in 2002   BREAST LUMPECTOMY WITH RADIOACTIVE SEED AND SENTINEL LYMPH NODE BIOPSY Right 06/28/2018   Procedure: BREAST LUMPECTOMY WITH RADIOACTIVE SEED AND SENTINEL LYMPH NODE BIOPSY;  Surgeon: [name], MD;  Location: [facility];  Service: General;  Laterality: Right;   PORTACATH PLACEMENT N/A 12/29/2017   Procedure: INSERTION PORT-A-CATH LEFT SUBCLAVIAN;  Surgeon: [name], MD;  Location: [facility];  Service: General;  Laterality: N/A;    FAMILY HISTORY Family History  Problem Relation Age of Onset   Diabetes Father    Heart attack Father    She notes that her father died from a possible MI in his early 35's Her mother is still alive and will be 101 March 2019!  The patient has 2 brothers and 2 sisters. Her brother had prostate cancer possibly agent orange related. She has a cousin who was recently diagnosed with stomach cancer. Her maternal grandmother died from colon cancer. She has a maternal uncle with bone cancer. She denies family hx of breast or ovarian cancer.    GYNECOLOGIC HISTORY:  Menarche: 73 years old Age at first live birth: 73 years old Blain P3 LMP: at age 44 Contraceptive: OCP HRT   Hysterectomy? 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 She also worked at [employer]. She lives at home with her husband, Veronica Price who is a retired Building control surveyor, her husband works part time driving an adult day care bus. Her daughter, Veronica Price is a Multimedia programmer. Her daughter Veronica Price lives in [place], [place] and works as a Therapist, sports for the [place]. Her son Veronica Price, lives in [place], and works as a Administrator.  The patient has 3 grandchildren and no great-grandchildren. She is of baptist faith.    ADVANCED DIRECTIVES:    HEALTH MAINTENANCE: Social History   Tobacco Use   Smoking status: Never Smoker   Smokeless tobacco: Never Used    Substance Use Topics   Alcohol use: Never    Frequency: Never   Drug use: Never     Colonoscopy: UTD; last year (2018)  PAP:  Bone density:    Allergies  Allergen Reactions   Prednisone Swelling and Other (See Comments)    " I think it was this that made my tongue swell "    Current Outpatient Medications  Medication Sig Dispense Refill   levothyroxine (SYNTHROID, LEVOTHROID) 25 MCG tablet Take 1 tablet (25 mcg total) by mouth daily before breakfast. 90 tablet 3   lidocaine-prilocaine (EMLA) cream Apply 1 application topically as needed. 30 g 0   naproxen sodium (ALEVE) 220 MG tablet Take 220 mg by mouth daily as needed (for pain).     No current facility-administered medications for this visit.    Facility-Administered Medications Ordered in Other Visits  Medication Dose Route Frequency Provider Last Rate Last Dose   heparin lock flush 100 unit/mL  250 Units Intracatheter Once [name], [name], MD        OBJECTIVE:   Vitals:   02/08/19 0850  BP: (!) 159/67  Pulse: (!) 58  Resp: 17  Temp: 97.8 F (36.6 C)  SpO2: 100%     Body mass index is  32.23 kg/m.   Wt Readings from Last 3 Encounters:  02/08/19 176 lb 3.2 oz (79.9 kg)  12/06/18 171 lb 3.2 oz (77.7 kg)  11/15/18 168 lb 9.6 oz (76.5 kg)  ECOG FS:1 - Symptomatic but completely ambulatory  GENERAL: Patient is a well appearing female in no acute distress HEENT:  Sclerae anicteric.  Oropharynx clear and moist. No ulcerations or evidence of oropharyngeal candidiasis. Neck is supple.  NODES:  No cervical, supraclavicular, or axillary lymphadenopathy palpated.  BREAST EXAM:  Right breast s/p lumpectomy and radiation, no sign of local recurrence, left breast benign LUNGS:  Clear to auscultation bilaterally.  No wheezes or rhonchi. HEART:  Regular rate and rhythm. No murmur appreciated. ABDOMEN:  Soft, nontender.  Positive, normoactive bowel sounds. No organomegaly palpated. MSK:  No focal spinal tenderness  to palpation. Full range of motion bilaterally in the upper extremities. EXTREMITIES:  No peripheral edema.   SKIN:  Clear with no obvious rashes or skin changes. No nail dyscrasia. NEURO:  Nonfocal. Well oriented.  Appropriate affect.     LAB RESULTS:  CMP     Component Value Date/Time   NA 140 02/08/2019 0805   K 4.2 02/08/2019 0805   CL 105 02/08/2019 0805   CO2 26 02/08/2019 0805   GLUCOSE 87 02/08/2019 0805   BUN 10 02/08/2019 0805   CREATININE 0.89 02/08/2019 0805   CALCIUM 9.0 02/08/2019 0805   PROT 7.8 02/08/2019 0805   ALBUMIN 3.8 02/08/2019 0805   AST 17 02/08/2019 0805   ALT 13 02/08/2019 0805   ALKPHOS 73 02/08/2019 0805   BILITOT 0.3 02/08/2019 0805   GFRNONAA >60 02/08/2019 0805   GFRAA >60 02/08/2019 0805    No results found for: TOTALPROTELP, ALBUMINELP, A1GS, A2GS, BETS, BETA2SER, GAMS, MSPIKE, SPEI  No results found for: KPAFRELGTCHN, LAMBDASER, [facility]  Lab Results  Component Value Date   WBC 4.2 02/08/2019   NEUTROABS 2.6 02/08/2019   HGB 12.7 02/08/2019   HCT 38.9 02/08/2019   MCV 98.7 02/08/2019   PLT 213 02/08/2019    _0 @  No results found for: LABCA2  No components found for: UEAVWU981  No results for input(s): INR in the last 168 hours.  No results found for: LABCA2  No results found for: XBJ478  No results found for: GNF621  No results found for: HYQ657  No results found for: CA2729  No components found for: HGQUANT  No results found for: CEA1 / No results found for: CEA1   No results found for: AFPTUMOR  No results found for: CHROMOGRNA  No results found for: PSA1  Appointment on 02/08/2019  Component Date Value Ref Range Status   WBC 02/08/2019 4.2  4.0 - 10.5 K/uL Final   RBC 02/08/2019 3.94  3.87 - 5.11 MIL/uL Final   Hemoglobin 02/08/2019 12.7  12.0 - 15.0 g/dL Final   HCT 02/08/2019 38.9  36.0 - 46.0 % Final   MCV 02/08/2019 98.7  80.0 - 100.0 fL Final   MCH 02/08/2019 32.2  26.0 -  34.0 pg Final   MCHC 02/08/2019 32.6  30.0 - 36.0 g/dL Final   RDW 02/08/2019 14.5  11.5 - 15.5 % Final   Platelets 02/08/2019 213  150 - 400 K/uL Final   nRBC 02/08/2019 0.0  0.0 - 0.2 % Final   Neutrophils Relative % 02/08/2019 62  % Final   Neutro Abs 02/08/2019 2.6  1.7 - 7.7 K/uL Final   Lymphocytes Relative 02/08/2019 23  % Final  Lymphs Abs 02/08/2019 1.0  0.7 - 4.0 K/uL Final   Monocytes Relative 02/08/2019 12  % Final   Monocytes Absolute 02/08/2019 0.5  0.1 - 1.0 K/uL Final   Eosinophils Relative 02/08/2019 2  % Final   Eosinophils Absolute 02/08/2019 0.1  0.0 - 0.5 K/uL Final   Basophils Relative 02/08/2019 1  % Final   Basophils Absolute 02/08/2019 0.0  0.0 - 0.1 K/uL Final   Immature Granulocytes 02/08/2019 0  % Final   Abs Immature Granulocytes 02/08/2019 0.01  0.00 - 0.07 K/uL Final   Performed at [facility], [place] [address], [place], [place] [address]   Sodium 02/08/2019 140  135 - 145 mmol/L Final   Potassium 02/08/2019 4.2  3.5 - 5.1 mmol/L Final   Chloride 02/08/2019 105  98 - 111 mmol/L Final   CO2 02/08/2019 26  22 - 32 mmol/L Final   Glucose, Bld 02/08/2019 87  70 - 99 mg/dL Final   BUN 02/08/2019 10  8 - 23 mg/dL Final   Creatinine 02/08/2019 0.89  0.44 - 1.00 mg/dL Final   Calcium 02/08/2019 9.0  8.9 - 10.3 mg/dL Final   Total Protein 02/08/2019 7.8  6.5 - 8.1 g/dL Final   Albumin 02/08/2019 3.8  3.5 - 5.0 g/dL Final   AST 02/08/2019 17  15 - 41 U/L Final   ALT 02/08/2019 13  0 - 44 U/L Final   Alkaline Phosphatase 02/08/2019 73  38 - 126 U/L Final   Total Bilirubin 02/08/2019 0.3  0.3 - 1.2 mg/dL Final   GFR, Est Non Af Am 02/08/2019 >60  >60 mL/min Final   GFR, Est AFR Am 02/08/2019 >60  >60 mL/min Final   Anion gap 02/08/2019 9  5 - 15 Final   Performed at [facility], [place] [address] [place], [place] [address]    (this displays the last labs from the last 3  days)  No results found for: TOTALPROTELP, ALBUMINELP, A1GS, A2GS, BETS, BETA2SER, GAMS, MSPIKE, SPEI (this displays SPEP labs)  No results found for: KPAFRELGTCHN, LAMBDASER, KAPLAMBRATIO (kappa/lambda light chains)  No results found for: HGBA, HGBA2QUANT, HGBFQUANT, HGBSQUAN (Hemoglobinopathy evaluation)   No results found for: LDH  No results found for: IRON, TIBC, IRONPCTSAT (Iron and TIBC)  No results found for: FERRITIN  Urinalysis No results found for: COLORURINE, APPEARANCEUR, LABSPEC, PHURINE, GLUCOSEU, HGBUR, BILIRUBINUR, KETONESUR, PROTEINUR, UROBILINOGEN, NITRITE, LEUKOCYTESUR   STUDIES: No results found.  ELIGIBLE FOR AVAILABLE RESEARCH PROTOCOL:  SWOG [id number] (observation arm)  ASSESSMENT: 73 y.o. [name], [place] woman status post right breast upper outer quadrant biopsy 11/25/2017 for a clinical T2 N2, stage IIIc invasive ductal carcinoma, grade 3, triple negative, with an MIB-1 of 80%.  (1) staging studies:  (a) chest CT 12/10/2017 scan showed no visceral metastatic disease  (b) bone scan 12/10/2017 showed uptake at T11, with sclerosis.  (c) CA-27-29 on 12/05/2017 was 19.0  (d) spinal MRI in [place] reportedly showed only arthrtis at T11  (2) neoadjuvant chemotherapy consisting of carboplatin/ paclitaxel x 12 (received 9) started 01/04/2018, discontinued 03/15/2018, followed by cyclophosphamide and doxorubicin given every 3 weeks x 4 starting 04/05/2018, completed 06/14/2018  (a) carboplatin/paclitaxel changed to carboplatin/gemcitabine after 8 cycles due to neuroapthy  (b) only able to tolerate 1 cycle of Gemcitabine/Carboplatin due to rash  (a) echocardiogram on 04/02/18 demonstrates a LVEF of 60-65%  (3) status post right lumpectomy and sentinel lymph node sampling on 06/28/2018 showing a residual 2.2 cm, grade 3  invasive ductal carcinoma, with negative margins and 0 of 3 sentinel lymph nodes removed involved (ypT2 ypN0) ° °(4) adjuvant radiation through the [facility]  group in [place] mid November through mid December 2019 ° °(5) participating in the pembrolizumab study S1418, randomized to observation ° °(6) pembrolizumab started (not as part of study) 11/15/2018, to be repeated every 21 days for 1 year ° °PLAN:  °Terilyn is doing well today.  Her labs are normal and I reviewed them with her in detail.  She has no clinical sign of recurrence and is tolerating the Pembrolizumab well.  She will continue this. ° °She does have some mild hypothyroidism and is taking Synthroid daily.  We are checking her TSH with every lab visit, and it has been stable.  She is tolerating this well and we will dose adjust as necessary.   ° °My nurse Lori Called [facility] again today to attempt to procure her mammogram results.  ° °Taytum will return every three weeks for labs and Pembrolizumab.  We will see her back in 12 weeks.  She knows to call us for any concerns that may arise in the interim.   ° °A total of (20) minutes of face-to-face time was spent with this patient with greater than 50% of that time in counseling and care-coordination. ° ° °Lindsey Causey, NP  02/08/19 9:03 AM °Medical Oncology and Hematology °[facility] °[address] °[place], [place] [address] °Tel. [phone]    Fax. [phone] ° ° ° °

## 2019-02-08 NOTE — Patient Instructions (Signed)
[facility] Discharge Instructions for Patients Receiving Chemotherapy  Today you received the following chemotherapy agents Keytruda  To help prevent nausea and vomiting after your treatment, we encourage you to take your nausea medication: As directed by MD   If you develop nausea and vomiting that is not controlled by your nausea medication, call the clinic.   BELOW ARE SYMPTOMS THAT SHOULD BE REPORTED IMMEDIATELY:  *FEVER GREATER THAN 100.5 F  *CHILLS WITH OR WITHOUT FEVER  NAUSEA AND VOMITING THAT IS NOT CONTROLLED WITH YOUR NAUSEA MEDICATION  *UNUSUAL SHORTNESS OF BREATH  *UNUSUAL BRUISING OR BLEEDING  TENDERNESS IN MOUTH AND THROAT WITH OR WITHOUT PRESENCE OF ULCERS  *URINARY PROBLEMS  *BOWEL PROBLEMS  UNUSUAL RASH Items with * indicate a potential emergency and should be followed up as soon as possible.  Feel free to call the clinic should you have any questions or concerns. The clinic phone number is [phone].  Please show the CHEMO ALERT CARD at check-in to the Emergency Department and triage nurse.   

## 2019-02-08 NOTE — Progress Notes (Signed)
Patient TSH is elevated again today per Dr. Jana Hakim.  He would like her synthroid to be increased to 62mcg.  I placed order with script [facility] [place], [place].  Thompson Caul, LPN to notify patient.   Wilber Bihari, NP

## 2019-02-08 NOTE — Telephone Encounter (Signed)
This RN received a VM from pt stating " I just got home from being seen up there and was told my labs were fine but now I got a message that a new prescription has been called in for me to take 50 mcg of synthyroid "  " I got enough pills here already of the 25 mcg why can't I take 2 of them and not have to get a new bottle and waste these "  This RN noted elevated TSH, new prescription sent today for above and note that a VM was left by the nurse working with [facility]/NP.  This RN called pt per home number and spoke with her husband. He states Veronica has already left to go to the pharmacy.  This RN informed him of call and to let Veronica Price know to use 2 of the synthyroid 25 mcg she has on hand a day and then when she has used them up to start on the 50 mcg tablets.  Mr Price verbalized understanding and stated he would let pt know.

## 2019-02-08 NOTE — Telephone Encounter (Signed)
LVM for patient informing that her TSH has is higher than last time.  NP has increased her synthroid to 50 mcg daily.  Left center number on vm if patient has questions.

## 2019-02-09 ENCOUNTER — Telehealth: Payer: Self-pay | Admitting: Adult Health

## 2019-02-09 NOTE — Telephone Encounter (Signed)
Tried to reach regarding schedule mailed °

## 2019-02-16 DIAGNOSIS — Z9889 Other specified postprocedural states: Secondary | ICD-10-CM | POA: Diagnosis not present

## 2019-02-16 DIAGNOSIS — E039 Hypothyroidism, unspecified: Secondary | ICD-10-CM | POA: Diagnosis not present

## 2019-02-16 DIAGNOSIS — Z9221 Personal history of antineoplastic chemotherapy: Secondary | ICD-10-CM | POA: Diagnosis not present

## 2019-02-16 DIAGNOSIS — C50411 Malignant neoplasm of upper-outer quadrant of right female breast: Secondary | ICD-10-CM | POA: Diagnosis not present

## 2019-02-16 DIAGNOSIS — Z171 Estrogen receptor negative status [ER-]: Secondary | ICD-10-CM | POA: Diagnosis not present

## 2019-02-22 DIAGNOSIS — C50411 Malignant neoplasm of upper-outer quadrant of right female breast: Secondary | ICD-10-CM | POA: Diagnosis not present

## 2019-02-22 DIAGNOSIS — Z90722 Acquired absence of ovaries, bilateral: Secondary | ICD-10-CM | POA: Diagnosis not present

## 2019-02-22 DIAGNOSIS — Z90710 Acquired absence of both cervix and uterus: Secondary | ICD-10-CM | POA: Diagnosis not present

## 2019-02-22 DIAGNOSIS — Z9079 Acquired absence of other genital organ(s): Secondary | ICD-10-CM | POA: Diagnosis not present

## 2019-03-02 ENCOUNTER — Other Ambulatory Visit: Payer: Self-pay

## 2019-03-02 ENCOUNTER — Inpatient Hospital Stay: Payer: Medicare Other

## 2019-03-02 VITALS — BP 155/81 | HR 64 | Temp 97.9°F | Resp 20 | Wt 177.5 lb

## 2019-03-02 DIAGNOSIS — C50411 Malignant neoplasm of upper-outer quadrant of right female breast: Secondary | ICD-10-CM

## 2019-03-02 DIAGNOSIS — Z171 Estrogen receptor negative status [ER-]: Secondary | ICD-10-CM

## 2019-03-02 DIAGNOSIS — E039 Hypothyroidism, unspecified: Secondary | ICD-10-CM

## 2019-03-02 DIAGNOSIS — Z95828 Presence of other vascular implants and grafts: Secondary | ICD-10-CM

## 2019-03-02 DIAGNOSIS — Z79899 Other long term (current) drug therapy: Secondary | ICD-10-CM | POA: Diagnosis not present

## 2019-03-02 DIAGNOSIS — Z5112 Encounter for antineoplastic immunotherapy: Secondary | ICD-10-CM | POA: Diagnosis not present

## 2019-03-02 LAB — COMPREHENSIVE METABOLIC PANEL
ALT: 17 U/L (ref 0–44)
AST: 22 U/L (ref 15–41)
Albumin: 3.8 g/dL (ref 3.5–5.0)
Alkaline Phosphatase: 73 U/L (ref 38–126)
Anion gap: 9 (ref 5–15)
BUN: 19 mg/dL (ref 8–23)
CO2: 25 mmol/L (ref 22–32)
Calcium: 8.9 mg/dL (ref 8.9–10.3)
Chloride: 105 mmol/L (ref 98–111)
Creatinine, Ser: 1.15 mg/dL — ABNORMAL HIGH (ref 0.44–1.00)
GFR calc Af Amer: 55 mL/min — ABNORMAL LOW (ref >60)
GFR calc non Af Amer: 47 mL/min — ABNORMAL LOW (ref >60)
Glucose, Bld: 92 mg/dL (ref 70–99)
Potassium: 4.1 mmol/L (ref 3.5–5.1)
Sodium: 139 mmol/L (ref 135–145)
Total Bilirubin: 0.2 mg/dL — ABNORMAL LOW (ref 0.3–1.2)
Total Protein: 7.8 g/dL (ref 6.5–8.1)

## 2019-03-02 LAB — CBC WITH DIFFERENTIAL/PLATELET
Abs Immature Granulocytes: 0.01 10*3/uL (ref 0.00–0.07)
Basophils Absolute: 0 10*3/uL (ref 0.0–0.1)
Basophils Relative: 1 %
Eosinophils Absolute: 0.1 10*3/uL (ref 0.0–0.5)
Eosinophils Relative: 2 %
HCT: 37.5 % (ref 36.0–46.0)
Hemoglobin: 12.1 g/dL (ref 12.0–15.0)
Immature Granulocytes: 0 %
Lymphocytes Relative: 22 %
Lymphs Abs: 0.9 10*3/uL (ref 0.7–4.0)
MCH: 32.4 pg (ref 26.0–34.0)
MCHC: 32.3 g/dL (ref 30.0–36.0)
MCV: 100.5 fL — ABNORMAL HIGH (ref 80.0–100.0)
Monocytes Absolute: 0.7 10*3/uL (ref 0.1–1.0)
Monocytes Relative: 16 %
Neutro Abs: 2.5 10*3/uL (ref 1.7–7.7)
Neutrophils Relative %: 59 %
Platelets: 200 10*3/uL (ref 150–400)
RBC: 3.73 MIL/uL — ABNORMAL LOW (ref 3.87–5.11)
RDW: 14.2 % (ref 11.5–15.5)
WBC: 4.2 10*3/uL (ref 4.0–10.5)
nRBC: 0 % (ref 0.0–0.2)

## 2019-03-02 LAB — TSH: TSH: 9.673 u[IU]/mL — ABNORMAL HIGH (ref 0.308–3.960)

## 2019-03-02 MED ORDER — SODIUM CHLORIDE 0.9 % IV SOLN
200.0000 mg | Freq: Once | INTRAVENOUS | Status: AC
  Administered 2019-03-02: 09:00:00 200 mg via INTRAVENOUS
  Filled 2019-03-02: qty 8

## 2019-03-02 MED ORDER — SODIUM CHLORIDE 0.9% FLUSH
10.0000 mL | Freq: Once | INTRAVENOUS | Status: AC
  Administered 2019-03-02: 10 mL
  Filled 2019-03-02: qty 10

## 2019-03-02 MED ORDER — HEPARIN SOD (PORK) LOCK FLUSH 100 UNIT/ML IV SOLN
500.0000 [IU] | Freq: Once | INTRAVENOUS | Status: AC | PRN
  Administered 2019-03-02: 500 [IU]
  Filled 2019-03-02: qty 5

## 2019-03-02 MED ORDER — SODIUM CHLORIDE 0.9% FLUSH
10.0000 mL | INTRAVENOUS | Status: DC | PRN
  Administered 2019-03-02: 10:00:00 10 mL
  Filled 2019-03-02: qty 10

## 2019-03-02 MED ORDER — SODIUM CHLORIDE 0.9 % IV SOLN
Freq: Once | INTRAVENOUS | Status: AC
  Administered 2019-03-02: 09:00:00 via INTRAVENOUS
  Filled 2019-03-02: qty 250

## 2019-03-02 NOTE — Patient Instructions (Signed)
[facility] Discharge Instructions for Patients Receiving Chemotherapy  Today you received the following Immunotherapy agent: Keytruda  To help prevent nausea and vomiting after your treatment, we encourage you to take your nausea medication as directed by your MD.   If you develop nausea and vomiting that is not controlled by your nausea medication, call the clinic.   BELOW ARE SYMPTOMS THAT SHOULD BE REPORTED IMMEDIATELY:  *FEVER GREATER THAN 100.5 F  *CHILLS WITH OR WITHOUT FEVER  NAUSEA AND VOMITING THAT IS NOT CONTROLLED WITH YOUR NAUSEA MEDICATION  *UNUSUAL SHORTNESS OF BREATH  *UNUSUAL BRUISING OR BLEEDING  TENDERNESS IN MOUTH AND THROAT WITH OR WITHOUT PRESENCE OF ULCERS  *URINARY PROBLEMS  *BOWEL PROBLEMS  UNUSUAL RASH Items with * indicate a potential emergency and should be followed up as soon as possible.  Feel free to call the clinic should you have any questions or concerns. The clinic phone number is [phone].  Please show the CHEMO ALERT CARD at check-in to the Emergency Department and triage nurse.  Coronavirus (COVID-19) Are you at risk?  Are you at risk for the Coronavirus (COVID-19)?  To be considered HIGH RISK for Coronavirus (COVID-19), you have to meet the following criteria:  . Traveled to [place], [place], [place], [place] or [place]; or in the [place] to [place], [place], [place], or [place]; and have fever, cough, and shortness of breath within the last 2 weeks of travel OR . Been in close contact with a person diagnosed with COVID-19 within the last 2 weeks and have fever, cough, and shortness of breath . IF YOU DO NOT MEET THESE CRITERIA, YOU ARE CONSIDERED LOW RISK FOR COVID-19.  What to do if you are HIGH RISK for COVID-19?  . If you are having a medical emergency, call 911. . Seek medical care right away. Before you go to a doctor's office, urgent care or emergency department, call ahead and tell them about  your recent travel, contact with someone diagnosed with COVID-19, and your symptoms. You should receive instructions from your physician's office regarding next steps of care.  . When you arrive at healthcare provider, tell the healthcare staff immediately you have returned from visiting [place], [place], [place], [place] or [place]; or traveled in the [place] to [place], [place], [place], or [place]; in the last two weeks or you have been in close contact with a person diagnosed with COVID-19 in the last 2 weeks.   . Tell the health care staff about your symptoms: fever, cough and shortness of breath. . After you have been seen by a medical provider, you will be either: o Tested for (COVID-19) and discharged home on quarantine except to seek medical care if symptoms worsen, and asked to  - Stay home and avoid contact with others until you get your results (4-5 days)  - Avoid travel on public transportation if possible (such as bus, train, or airplane) or o Sent to the Emergency Department by EMS for evaluation, COVID-19 testing, and possible admission depending on your condition and test results.  What to do if you are LOW RISK for COVID-19?  Reduce your risk of any infection by using the same precautions used for avoiding the common cold or flu:  . Wash your hands often with soap and warm water for at least 20 seconds.  If soap and water are not readily available, use an alcohol-based hand sanitizer with at least 60% alcohol.  . If   coughing or sneezing, cover your mouth and nose by coughing or sneezing into the elbow areas of your shirt or coat, into a tissue or into your sleeve (not your hands). . Avoid shaking hands with others and consider head nods or verbal greetings only. . Avoid touching your eyes, nose, or mouth with unwashed hands.  . Avoid close contact with people who are sick. . Avoid places or events with large numbers of people in one location, like concerts or sporting  events. . Carefully consider travel plans you have or are making. . If you are planning any travel outside or inside the US, visit the CDC's Travelers' Health webpage for the latest health notices. . If you have some symptoms but not all symptoms, continue to monitor at home and seek medical attention if your symptoms worsen. . If you are having a medical emergency, call 911.   ADDITIONAL HEALTHCARE OPTIONS FOR PATIENTS  [facility] / e-Visit: [email or web address]         [facility]: [phone]  [facility]: [phone]                   [facility]: [phone]    

## 2019-03-08 ENCOUNTER — Telehealth: Payer: Self-pay | Admitting: *Deleted

## 2019-03-08 NOTE — Telephone Encounter (Signed)
This RN attempted to contact pt today for update on rash and dry mouth per discussion yesterday.  Yesterday pt called to [facility] early am stating rash noted on hands.  Per call yesterday - Perrin states she has been working in her yard and noted itching and " like bumps under my skin " on her hands and wrist and one knee.  She is inquiring if the rash could be from the [place].  This RN discussed above rash not descriptive for a drug reaction - which usually occurs on chest , back and inside elbow area.  Bryanda denies any SOB.  She states she is sensitive to poison oak and usually breaks out yearly when working in her yard.  She uses Calamine lotion usually but is currently out of it.  She states her mouth is dry " and the sides of my tongue feel like there are bumps ". She denies any sores or color changes.  Hydration discussed with Dawnisha stating she is not taking in optimal liquid daily.  Dry mouth discussed with need for better hydration as well as use of coconut oil that she can swish in her mouth- she can swallow if needed for moisturizing of mucous membranes.  Plan was for pt to initiate the above- and this RN would call her today.  Message left per call to pt on VM to return call to this RN.

## 2019-03-22 ENCOUNTER — Other Ambulatory Visit: Payer: Self-pay | Admitting: Oncology

## 2019-03-22 ENCOUNTER — Inpatient Hospital Stay: Payer: Medicare Other | Attending: Nurse Practitioner

## 2019-03-22 ENCOUNTER — Other Ambulatory Visit: Payer: Self-pay

## 2019-03-22 ENCOUNTER — Inpatient Hospital Stay: Payer: Medicare Other

## 2019-03-22 VITALS — BP 152/87 | HR 67 | Temp 98.7°F | Resp 18

## 2019-03-22 DIAGNOSIS — Z5112 Encounter for antineoplastic immunotherapy: Secondary | ICD-10-CM | POA: Diagnosis not present

## 2019-03-22 DIAGNOSIS — C50411 Malignant neoplasm of upper-outer quadrant of right female breast: Secondary | ICD-10-CM

## 2019-03-22 DIAGNOSIS — Z171 Estrogen receptor negative status [ER-]: Secondary | ICD-10-CM

## 2019-03-22 DIAGNOSIS — Z95828 Presence of other vascular implants and grafts: Secondary | ICD-10-CM

## 2019-03-22 LAB — CBC WITH DIFFERENTIAL (CANCER CENTER ONLY)
Abs Immature Granulocytes: 0.01 10*3/uL (ref 0.00–0.07)
Basophils Absolute: 0 10*3/uL (ref 0.0–0.1)
Basophils Relative: 1 %
Eosinophils Absolute: 0.1 10*3/uL (ref 0.0–0.5)
Eosinophils Relative: 2 %
HCT: 39.4 % (ref 36.0–46.0)
Hemoglobin: 12.8 g/dL (ref 12.0–15.0)
Immature Granulocytes: 0 %
Lymphocytes Relative: 17 %
Lymphs Abs: 0.8 10*3/uL (ref 0.7–4.0)
MCH: 33 pg (ref 26.0–34.0)
MCHC: 32.5 g/dL (ref 30.0–36.0)
MCV: 101.5 fL — ABNORMAL HIGH (ref 80.0–100.0)
Monocytes Absolute: 0.5 10*3/uL (ref 0.1–1.0)
Monocytes Relative: 11 %
Neutro Abs: 3.2 10*3/uL (ref 1.7–7.7)
Neutrophils Relative %: 69 %
Platelet Count: 168 10*3/uL (ref 150–400)
RBC: 3.88 MIL/uL (ref 3.87–5.11)
RDW: 14 % (ref 11.5–15.5)
WBC Count: 4.6 10*3/uL (ref 4.0–10.5)
nRBC: 0 % (ref 0.0–0.2)

## 2019-03-22 LAB — COMPREHENSIVE METABOLIC PANEL
ALT: 15 U/L (ref 0–44)
AST: 17 U/L (ref 15–41)
Albumin: 3.7 g/dL (ref 3.5–5.0)
Alkaline Phosphatase: 75 U/L (ref 38–126)
Anion gap: 9 (ref 5–15)
BUN: 10 mg/dL (ref 8–23)
CO2: 23 mmol/L (ref 22–32)
Calcium: 8.8 mg/dL — ABNORMAL LOW (ref 8.9–10.3)
Chloride: 107 mmol/L (ref 98–111)
Creatinine, Ser: 0.93 mg/dL (ref 0.44–1.00)
GFR calc Af Amer: >60 mL/min (ref >60)
GFR calc non Af Amer: >60 mL/min (ref >60)
Glucose, Bld: 95 mg/dL (ref 70–99)
Potassium: 4.3 mmol/L (ref 3.5–5.1)
Sodium: 139 mmol/L (ref 135–145)
Total Bilirubin: 0.3 mg/dL (ref 0.3–1.2)
Total Protein: 7.5 g/dL (ref 6.5–8.1)

## 2019-03-22 MED ORDER — SODIUM CHLORIDE 0.9% FLUSH
10.0000 mL | Freq: Once | INTRAVENOUS | Status: AC
  Administered 2019-03-22: 10 mL
  Filled 2019-03-22: qty 10

## 2019-03-22 MED ORDER — HEPARIN SOD (PORK) LOCK FLUSH 100 UNIT/ML IV SOLN
500.0000 [IU] | Freq: Once | INTRAVENOUS | Status: AC | PRN
  Administered 2019-03-22: 500 [IU]
  Filled 2019-03-22: qty 5

## 2019-03-22 MED ORDER — SODIUM CHLORIDE 0.9 % IV SOLN
Freq: Once | INTRAVENOUS | Status: AC
  Administered 2019-03-22: 09:00:00 via INTRAVENOUS
  Filled 2019-03-22: qty 250

## 2019-03-22 MED ORDER — SODIUM CHLORIDE 0.9 % IV SOLN
200.0000 mg | Freq: Once | INTRAVENOUS | Status: AC
  Administered 2019-03-22: 200 mg via INTRAVENOUS
  Filled 2019-03-22: qty 8

## 2019-03-22 MED ORDER — SODIUM CHLORIDE 0.9% FLUSH
10.0000 mL | INTRAVENOUS | Status: DC | PRN
  Administered 2019-03-22: 10 mL
  Filled 2019-03-22: qty 10

## 2019-03-22 NOTE — Patient Instructions (Signed)
[facility] Discharge Instructions for Patients Receiving Chemotherapy  Today you received the following chemotherapy agents:  Keytruda.  To help prevent nausea and vomiting after your treatment, we encourage you to take your nausea medication as directed.   If you develop nausea and vomiting that is not controlled by your nausea medication, call the clinic.   BELOW ARE SYMPTOMS THAT SHOULD BE REPORTED IMMEDIATELY:  *FEVER GREATER THAN 100.5 F  *CHILLS WITH OR WITHOUT FEVER  NAUSEA AND VOMITING THAT IS NOT CONTROLLED WITH YOUR NAUSEA MEDICATION  *UNUSUAL SHORTNESS OF BREATH  *UNUSUAL BRUISING OR BLEEDING  TENDERNESS IN MOUTH AND THROAT WITH OR WITHOUT PRESENCE OF ULCERS  *URINARY PROBLEMS  *BOWEL PROBLEMS  UNUSUAL RASH Items with * indicate a potential emergency and should be followed up as soon as possible.  Feel free to call the clinic should you have any questions or concerns. The clinic phone number is [phone].  Please show the CHEMO ALERT CARD at check-in to the Emergency Department and triage nurse.    

## 2019-03-23 ENCOUNTER — Other Ambulatory Visit: Payer: Self-pay | Admitting: Oncology

## 2019-03-23 NOTE — Progress Notes (Unsigned)
Ms. Astarita has developed a rash likely secondary to her pembrolizumab.  We are going to give her a brief break.  She will not be treated 04/12/2019.  Her next treatment will be 05/03/2019.  She is aware of this.

## 2019-04-05 ENCOUNTER — Other Ambulatory Visit: Payer: Self-pay | Admitting: Oncology

## 2019-04-05 DIAGNOSIS — C50411 Malignant neoplasm of upper-outer quadrant of right female breast: Secondary | ICD-10-CM

## 2019-04-10 DIAGNOSIS — R21 Rash and other nonspecific skin eruption: Secondary | ICD-10-CM | POA: Diagnosis not present

## 2019-04-10 DIAGNOSIS — L255 Unspecified contact dermatitis due to plants, except food: Secondary | ICD-10-CM | POA: Diagnosis not present

## 2019-04-12 ENCOUNTER — Inpatient Hospital Stay: Payer: Medicare Other | Attending: Nurse Practitioner | Admitting: Adult Health

## 2019-04-12 ENCOUNTER — Ambulatory Visit: Payer: Medicare Other

## 2019-04-12 ENCOUNTER — Other Ambulatory Visit: Payer: Medicare Other

## 2019-04-12 ENCOUNTER — Other Ambulatory Visit: Payer: Self-pay

## 2019-04-12 ENCOUNTER — Encounter: Payer: Self-pay | Admitting: Adult Health

## 2019-04-12 VITALS — BP 162/82 | HR 62 | Temp 98.9°F | Resp 17 | Ht 62.0 in | Wt 174.7 lb

## 2019-04-12 DIAGNOSIS — R21 Rash and other nonspecific skin eruption: Secondary | ICD-10-CM

## 2019-04-12 DIAGNOSIS — Z79899 Other long term (current) drug therapy: Secondary | ICD-10-CM | POA: Insufficient documentation

## 2019-04-12 DIAGNOSIS — G620 Drug-induced polyneuropathy: Secondary | ICD-10-CM

## 2019-04-12 DIAGNOSIS — C50411 Malignant neoplasm of upper-outer quadrant of right female breast: Secondary | ICD-10-CM

## 2019-04-12 DIAGNOSIS — Z5112 Encounter for antineoplastic immunotherapy: Secondary | ICD-10-CM | POA: Insufficient documentation

## 2019-04-12 DIAGNOSIS — Z171 Estrogen receptor negative status [ER-]: Secondary | ICD-10-CM | POA: Diagnosis not present

## 2019-04-12 MED ORDER — METHYLPREDNISOLONE 4 MG PO TABS: 4.0000 mg | ORAL_TABLET | Freq: Every day | ORAL | 0 refills | Status: DC

## 2019-04-12 NOTE — Progress Notes (Addendum)
CLINIC:  Survivorship   REASON FOR VISIT:  Routine follow-up post-treatment for a recent history of breast cancer.  BRIEF ONCOLOGIC HISTORY:  Oncology History  Malignant neoplasm of upper-outer quadrant of right breast in female, estrogen receptor negative ([place])  11/25/2017 Initial Diagnosis   73 y.o. Veronica Price, [place] woman status post right breast upper outer quadrant biopsy 11/25/2017 for a clinical T2 N2, stage IIIc invasive ductal carcinoma, grade 3, triple negative, with an MIB-1 of 80%.   (1) staging studies:             (a) chest CT 12/10/2017 scan showed no visceral metastatic disease             (b) bone scan 12/10/2017 showed uptake at T11, with sclerosis.             (c) CA-27-29 on 12/05/2017 was 19.0             (d) spinal MRI in [place] reportedly showed only arthrtis at T11     01/04/2018 - 06/14/2018 Neo-Adjuvant Chemotherapy   (2) neoadjuvant chemotherapy consisting of carboplatin/ paclitaxel x 12 (received 9) started 01/04/2018, discontinued 03/15/2018, followed by cyclophosphamide and doxorubicin given every 3 weeks x 4 starting 04/05/2018, completed 06/14/2018             (a) carboplatin/paclitaxel changed to carboplatin/gemcitabine after 8 cycles due to neuroapthy             (b) only able to tolerate 1 cycle of Gemcitabine/Carboplatin due to rash             (a) echocardiogram on 04/02/18 demonstrates a LVEF of 60-65%   06/28/2018 Surgery   status post right lumpectomy and sentinel lymph node sampling on showing a residual 2.2 cm, grade 3 invasive ductal carcinoma, with negative margins and 0 of 3 sentinel lymph nodes removed involved (ypT2 ypN0)   08/16/2018 - 09/08/2018 Radiation Therapy   Treatment site Treatment Technique/Modality Energy Dose per fraction Total number of fractions Total dose Start date End date  Right Breast 3D CRT 6 MV 266 cGy 16 4256 cGy 08/16/2018   09/08/2018  Lumpectomy boost Enface Electrons 15 MeV 200 cGy 5 1000 cGy    11/15/2018 Miscellaneous    pembrolizumab started (not as part of study) 11/15/2018, to be repeated every 21 days for 1 year     INTERVAL HISTORY:  [name] presents to the [facility] today for our initial meeting to review her survivorship care plan detailing her treatment course for breast cancer, as well as monitoring long-term side effects of that treatment, education regarding health maintenance, screening, and overall wellness and health promotion.     Veronica Price is also on treatment with Pembrolizumab.  She was supposed to get this today, however on 03/23/2019 she was noted to have developed a rash.  The rash is persistent.  She has also seen urgent care for the rash, and she was given a pill for the itch.  She cannot recall the name of it and her pharmacy isn't yet open for [place] to call and ask about it.  She notes that she was also given instructions to take cold baths, and calamine lotion didn't help.    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  It remains worse at night, and constant in left toes, intermittent in right toes.  She has some challenges with walking, but relates that to arthritis.    She underwent mammogram in 11/2018 that was negative, and showed normal breast  density.   REVIEW OF SYSTEMS:  Review of Systems  Constitutional: Positive for fatigue. Negative for appetite change, chills, fever and unexpected weight change.  HENT:   Negative for hearing loss and lump/mass.   Eyes: Negative for eye problems and icterus.  Respiratory: Negative for chest tightness, cough and shortness of breath.   Cardiovascular: Negative for chest pain, leg swelling and palpitations.  Gastrointestinal: Positive for constipation. Negative for abdominal distention, abdominal pain, diarrhea and nausea.  Endocrine: Negative for hot flashes.  Genitourinary: Negative for difficulty urinating.   Musculoskeletal: Positive for arthralgias (chronic in knees in the morning).  Skin: Positive  for itching and rash.  Neurological: Positive for numbness (per interval history). Negative for dizziness, extremity weakness and headaches.  Hematological: Negative for adenopathy. Does not bruise/bleed easily.  Psychiatric/Behavioral: Negative for depression. The patient is not nervous/anxious.    Breast: Denies any new nodularity, masses, tenderness, nipple changes, or nipple discharge.      ONCOLOGY TREATMENT TEAM:  1. Surgeon:  Dr. Dalbert Batman at [facility] 2. Medical Oncologist: Dr. Jana Hakim  3. Radiation Oncologist: Dr. Francesca Jewett    PAST MEDICAL/SURGICAL HISTORY:  Past Medical History:  Diagnosis Date   Arthritis 2003   Breast cancer ([place]) 11/25/2017   Right breast   Cataract 2016   "early" per patient-no surgery yet    GERD (gastroesophageal reflux disease) 2003   per patient report   Hypothyroidism 10/20/2018   Past Surgical History:  Procedure Laterality Date   ABDOMINAL HYSTERECTOMY  2002   Total hysterectomy in 2002   BREAST LUMPECTOMY WITH RADIOACTIVE SEED AND SENTINEL LYMPH NODE BIOPSY Right 06/28/2018   Procedure: BREAST LUMPECTOMY WITH RADIOACTIVE SEED AND SENTINEL LYMPH NODE BIOPSY;  Surgeon: [name], MD;  Location: [facility];  Service: General;  Laterality: Right;   PORTACATH PLACEMENT N/A 12/29/2017   Procedure: INSERTION PORT-A-CATH LEFT SUBCLAVIAN;  Surgeon: [name], MD;  Location: [facility];  Service: General;  Laterality: N/A;     ALLERGIES:  Allergies  Allergen Reactions   Prednisone Swelling and Other (See Comments)    " I think it was this that made my tongue swell "     CURRENT MEDICATIONS:  Outpatient Encounter Medications as of 04/12/2019  Medication Sig   levothyroxine (SYNTHROID) 50 MCG tablet TAKE 1 TABLET BY MOUTH DAILY BEFORE BREAKFAST   lidocaine-prilocaine (EMLA) cream Apply 1 application topically as needed.   naproxen sodium (ALEVE) 220 MG tablet Take 220 mg by mouth daily as needed  (for pain).   Facility-Administered Encounter Medications as of 04/12/2019  Medication   heparin lock flush 100 unit/mL     ONCOLOGIC FAMILY HISTORY:  Family History  Problem Relation Age of Onset   Diabetes Father    Heart attack Father      GENETIC COUNSELING/TESTING: Not at this time  SOCIAL HISTORY:  Social History   Socioeconomic History   Marital status: Married    Spouse name: Not on file   Number of children: Not on file   Years of education: Not on file   Highest education level: Not on file  Occupational History   Not on file  Social Needs   Financial resource strain: Not on file   Food insecurity    Worry: Not on file    Inability: Not on file   Transportation needs    Medical: Not on file    Non-medical: Not on file  Tobacco Use   Smoking status: Never Smoker   Smokeless  tobacco: Never Used  Substance and Sexual Activity   Alcohol use: Never    Frequency: Never   Drug use: Never   Sexual activity: Not on file  Lifestyle   Physical activity    Days per week: Not on file    Minutes per session: Not on file   Stress: Not on file  Relationships   Social connections    Talks on phone: Not on file    Gets together: Not on file    Attends religious service: Not on file    Active member of club or organization: Not on file    Attends meetings of clubs or organizations: Not on file    Relationship status: Not on file   Intimate partner violence    Fear of current or ex partner: Not on file    Emotionally abused: Not on file    Physically abused: Not on file    Forced sexual activity: Not on file  Other Topics Concern   Not on file  Social History Narrative   Not on file     PHYSICAL EXAMINATION:  Vital Signs:   Vitals:   04/12/19 0814  BP: (!) 162/82  Pulse: 62  Resp: 17  Temp: 98.9 F (37.2 C)  SpO2: 100%   Filed Weights   04/12/19 0814  Weight: 174 lb 11.2 oz (79.2 kg)   General: Well-nourished,  well-appearing female in no acute distress.  She is unaccompanied today.   HEENT: Head is normocephalic.  Pupils equal and reactive to light. Conjunctivae clear without exudate.  Sclerae anicteric. Oral mucosa is pink, moist.  Oropharynx is pink without lesions or erythema.  Lymph: No cervical, supraclavicular, or infraclavicular lymphadenopathy noted on palpation.  Cardiovascular: Regular rate and rhythm.Marland Kitchen Respiratory: Clear to auscultation bilaterally. Chest expansion symmetric; breathing non-labored.  GI: Abdomen soft and round; non-tender, non-distended. Bowel sounds normoactive.  GU: Deferred.  Neuro: No focal deficits. Steady gait.  Psych: Mood and affect normal and appropriate for situation.  Extremities: No edema. MSK: No focal spinal tenderness to palpation.  Full range of motion in bilateral upper extremities Skin: Warm and dry. Confluent erythematous rash on bilateral extremites, non palpable, also noted around right nipple, and bilateral ankles  LABORATORY DATA:  None for this visit.  DIAGNOSTIC IMAGING:  None for this visit.      ASSESSMENT AND PLAN:  Ms.. Veronica Price is a pleasant 73 y.o. female with Stage IIIC right breast invasive ductal carcinoma, ER-/PR-/HER2-, diagnosed in 11/2017, treated with neoadjuvant chemotherapy, lumpectomy, adjuvant radiation therapy, and Pembrolizumab that she is currently receiving every 3 weeks.  She presents to the Survivorship Clinic for our initial meeting and routine follow-up post-completion of treatment for breast cancer.    1. Stage IIIC right breast cancer:  [name] is continuing to recover from definitive treatment for breast cancer. She will follow-up with her medical oncologist, Dr. Jana Hakim on 05/03/2019 to continue with Pembrolizumab.  She undergoes mammogram's every February.  Her last one was normal.  This is scanned into the media tab in epic.   Today, a comprehensive survivorship care plan and treatment summary was reviewed with the  patient today detailing her breast cancer diagnosis, treatment course, potential late/long-term effects of treatment, appropriate follow-up care with recommendations for the future, and patient education resources.  A copy of this summary, along with a letter will be sent to the patients primary care provider via mail/fax/In Basket message after todays visit.    2. Skin Rash: No Pembrolizumab  today.  Medrol dosepak prescribed.  She was also recommended benadryl cream BID.  Patient seen by Dr. Jana Hakim as well.   3. Bone health:  Given [name]'s age/history of breast cancer, she is at risk for bone demineralization.  She is unsure if she has had bone density testing.  I will defer to her PCP about this  She was given education on specific activities to promote bone health.  4. Cancer screening:  Due to [name]'s history and her age, she should receive screening for skin cancers, colon cancer, and gynecologic cancers.  The information and recommendations are listed on the patient's comprehensive care plan/treatment summary and were reviewed in detail with the patient.    5. Health maintenance and wellness promotion: [name] was encouraged to consume 5-7 servings of fruits and vegetables per day. We reviewed the "Nutrition Rainbow" handout, as well as the handout "Take Control of Your Health and Reduce Your Cancer Risk" from the [place].  She was also encouraged to engage in moderate to vigorous exercise for 30 minutes per day most days of the week. We discussed the [employer] fitness program, which is designed for cancer survivors to help them become more physically fit after cancer treatments.  She was instructed to limit her alcohol consumption and continue to abstain from tobacco use.     6. Support services/counseling: It is not uncommon for this period of the patient's cancer care trajectory to be one of many emotions and stressors.  We discussed an opportunity for her to  participate in the next session of [facility] ("Finding Your New Normal") support group series designed for patients after they have completed treatment.   [name] was encouraged to take advantage of our many other support services programs, support groups, and/or counseling in coping with her new life as a cancer survivor after completing anti-cancer treatment.  She was offered support today through active listening and expressive supportive counseling.  She was given information regarding our available services and encouraged to contact me with any questions or for help enrolling in any of our support group/programs.    Dispo:   -Return to cancer center on 05/03/2019 for f/u with Dr. Jana Hakim  -Mammogram due in 11/2019 -She is welcome to return back to the Survivorship Clinic at any time; no additional follow-up needed at this time.  -Consider referral back to survivorship as a long-term survivor for continued surveillance    Gardenia Phlegm, NP [place] [phone]   Note: PRIMARY CARE PROVIDER Patient, No Pcp Per None None    ADDENDUM: The patient's rash is very consistent with an exposure dermatitis and she did significant yard work near some poison oak.  Possibly that is all that is going on.  Nevertheless there is the possibility that we are dealing with a Keytruda rash and I am going to hold the treatment today.  The patient will have a Medrol Dosepak and she may also use Benadryl cream topically.  Hopefully the rash will have resolved and we can retreat when she returns 3 weeks from now  I personally saw this patient and performed a substantive portion of this encounter with the listed APP documented above.   Chauncey Cruel, MD Medical Oncology and Hematology [facility] [address] [place], [place] [address] Tel. [phone]    Fax. [phone]

## 2019-04-12 NOTE — Patient Instructions (Signed)

## 2019-04-21 ENCOUNTER — Telehealth: Payer: Self-pay | Admitting: *Deleted

## 2019-04-21 NOTE — Telephone Encounter (Signed)
error 

## 2019-04-21 NOTE — Telephone Encounter (Signed)
This RN returned call to pt per VM left by her daughter Jocelyn Lamer ) who states pt is continuing with itching and rash , now with some swelling unrelieved with medrol dose pak.  Per discussion with Dylan- she states rash seemed to be improving but then over the past week- " it's seems to be worse "  She states the rash started on her hands then to her left arm which " kinda got swolled up " - she states the swelling went away " and the skin looks all wrinkled up like an old lady "  She now has swelling in her right arm and affected breast " but that kinda comes and goes " - but states the rash is now " all down my abdomen and on my back and just itching awful.  Note rash occurred post starting of Keytruda - which has been held since onset post 03/22/2019 treatment.  She was seen last week by [facility]/NP - and dispensed medrol dose pak " but it's gotten worse after taking it "  Per above - need to come in for evaluation discussed - pt has commitments tomorrow but can come early am for symptom management.  LOS sent per above.  Daughter contacted as well for communication and coordination of travel.

## 2019-04-21 NOTE — Telephone Encounter (Signed)
see other entry

## 2019-04-22 ENCOUNTER — Inpatient Hospital Stay (HOSPITAL_BASED_OUTPATIENT_CLINIC_OR_DEPARTMENT_OTHER): Payer: Medicare Other | Admitting: Medical

## 2019-04-22 ENCOUNTER — Encounter: Payer: Medicare Other | Admitting: Adult Health

## 2019-04-22 ENCOUNTER — Other Ambulatory Visit: Payer: Self-pay

## 2019-04-22 ENCOUNTER — Telehealth: Payer: Self-pay | Admitting: Medical

## 2019-04-22 VITALS — BP 137/83 | HR 65 | Temp 98.5°F | Resp 17 | Ht 62.0 in | Wt 174.7 lb

## 2019-04-22 DIAGNOSIS — Z79899 Other long term (current) drug therapy: Secondary | ICD-10-CM | POA: Diagnosis not present

## 2019-04-22 DIAGNOSIS — C50411 Malignant neoplasm of upper-outer quadrant of right female breast: Secondary | ICD-10-CM | POA: Diagnosis not present

## 2019-04-22 DIAGNOSIS — Z5112 Encounter for antineoplastic immunotherapy: Secondary | ICD-10-CM | POA: Diagnosis not present

## 2019-04-22 DIAGNOSIS — Z171 Estrogen receptor negative status [ER-]: Secondary | ICD-10-CM | POA: Diagnosis not present

## 2019-04-22 DIAGNOSIS — R21 Rash and other nonspecific skin eruption: Secondary | ICD-10-CM

## 2019-04-22 MED ORDER — PREDNISONE 20 MG PO TABS: 80.0000 mg | ORAL_TABLET | Freq: Every day | ORAL | 0 refills | Status: DC

## 2019-04-22 MED ORDER — DESOXIMETASONE 0.25 % EX CREA: 1.0000 "application " | TOPICAL_CREAM | Freq: Two times a day (BID) | CUTANEOUS | 1 refills | Status: DC

## 2019-04-22 MED ORDER — PREDNISONE 20 MG PO TABS
80.0000 mg | ORAL_TABLET | Freq: Once | ORAL | Status: AC
  Administered 2019-04-22: 80 mg via ORAL
  Filled 2019-04-22: qty 4

## 2019-04-22 NOTE — Telephone Encounter (Signed)
Gave avs and calendar ° °

## 2019-04-22 NOTE — Patient Instructions (Signed)
Rash, Adult  A rash is a change in the color of your skin. A rash can also change the way your skin feels. There are many different conditions and factors that can cause a rash. Follow these instructions at home: The goal of treatment is to stop the itching and keep the rash from spreading. Watch for any changes in your symptoms. Let your doctor know about them. Follow these instructions to help with your condition: Medicine Take or apply over-the-counter and prescription medicines only as told by your doctor. These may include medicines:  To treat red or swollen skin (corticosteroid creams).  To treat itching.  To treat an allergy (oral antihistamines).  To treat very bad symptoms (oral corticosteroids).  Skin care  Put cool cloths (compresses) on the affected areas.  Do not scratch or rub your skin.  Avoid covering the rash. Make sure that the rash is exposed to air as much as possible. Managing itching and discomfort  Avoid hot showers or baths. These can make itching worse. A cold shower may help.  Try taking a bath with: ? Epsom salts. You can get these at your local pharmacy or grocery store. Follow the instructions on the package. ? Baking soda. Pour a small amount into the bath as told by your doctor. ? Colloidal oatmeal. You can get this at your local pharmacy or grocery store. Follow the instructions on the package.  Try putting baking soda paste onto your skin. Stir water into baking soda until it gets like a paste.  Try putting on a lotion that relieves itchiness (calamine lotion).  Keep cool and out of the sun. Sweating and being hot can make itching worse. General instructions   Rest as needed.  Drink enough fluid to keep your pee (urine) pale yellow.  Wear loose-fitting clothing.  Avoid scented soaps, detergents, and perfumes. Use gentle soaps, detergents, perfumes, and other cosmetic products.  Avoid anything that causes your rash. Keep a journal to  help track what causes your rash. Write down: ? What you eat. ? What cosmetic products you use. ? What you drink. ? What you wear. This includes jewelry.  Keep all follow-up visits as told by your doctor. This is important. Contact a doctor if:  You sweat at night.  You lose weight.  You pee (urinate) more than normal.  You pee less than normal, or you notice that your pee is a darker color than normal.  You feel weak.  You throw up (vomit).  Your skin or the whites of your eyes look yellow (jaundice).  Your skin: ? Tingles. ? Is numb.  Your rash: ? Does not go away after a few days. ? Gets worse.  You are: ? More thirsty than normal. ? More tired than normal.  You have: ? New symptoms. ? Pain in your belly (abdomen). ? A fever. ? Watery poop (diarrhea). Get help right away if:  You have a fever and your symptoms suddenly get worse.  You start to feel mixed up (confused).  You have a very bad headache or a stiff neck.  You have very bad joint pains or stiffness.  You have jerky movements that you cannot control (seizure).  Your rash covers all or most of your body. The rash may or may not be painful.  You have blisters that: ? Are on top of the rash. ? Grow larger. ? Grow together. ? Are painful. ? Are inside your nose or mouth.  You have a rash   that: ? Looks like purple pinprick-sized spots all over your body. ? Has a "bull's eye" or looks like a target. ? Is red and painful, causes your skin to peel, and is not from being in the sun too long. Summary  A rash is a change in the color of your skin. A rash can also change the way your skin feels.  The goal of treatment is to stop the itching and keep the rash from spreading.  Take or apply over-the-counter and prescription medicines only as told by your doctor.  Contact a doctor if you have new symptoms or symptoms that get worse.  Keep all follow-up visits as told by your doctor. This is  important. This information is not intended to replace advice given to you by your health care provider. Make sure you discuss any questions you have with your health care provider. Document Released: 03/10/2008 Document Revised: 01/14/2019 Document Reviewed: 04/26/2018 Elsevier Patient Education  2020 [employer]  

## 2019-04-22 NOTE — Progress Notes (Signed)
[facility] Progress Note   Veronica Price [id number] Jun 13, 1946 73 y.o.  [name] is managed by Dr. 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 Jana Hakim and is status post cycle 7, day 1 of Keytruda which was dosed on 03/22/2019.  She is scheduled to be seen in follow-up on 05/03/2019.  Skin rash secondary to Keytruda: Patient was seen with Dr. Nicholas Lose.  He recommended placing the patient on prednisone at 1 mg/kg/day with plans to decrease to 2 mg/kg/day if the patient does not show a response.  He recommended a 4-week taper.  The patient is scheduled to see Dr. Jana Hakim in follow-up on 05/03/2019.  She will continue on the current dose of prednisone until she is seen at that time.  Additionally she was given a prescription for Topicort 0.25% cream.  Please see After Visit Summary for patient specific instructions.  Future Appointments  Date Time Provider [place]  05/03/2019  8:45 AM [facility] LAB 2 [facility] None  05/03/2019  9:00 AM [facility] [place] FLUSH [facility] None  05/03/2019  9:30 AM [name], [name], MD [facility] None  05/03/2019 10:00 AM [facility] INFUSION [facility] None    No orders of the defined types were placed in this encounter.      Subjective:   Patient ID:  Veronica Price is a 73 y.o. (DOB 1945/12/29) female.  Chief Complaint: No chief complaint on file.   HPI Veronica Price is a 73 year old female with a diagnosis of an ER negative malignant neoplasm of the right breast. She continues to be managed by Dr. Jana Hakim  and is status post cycle 7, day 1 of Keytruda which was dosed on 03/22/2019.  She has had a rash over the majority of her body for the last 2 to 3 weeks.  She was placed on a Medrol Dosepak.  She believes that her rash worsened while on the Medrol Dosepak.  She was seen at urgent care and was given a steroid shot.  She initially thought that her rash was related to a possible exposure to poison ivy.  She is having itching with a possible area of a blister on her left arm.  She reports that her hands are significantly swollen.  She has been putting alcohol on a washcloth and rubbing her arms with this to help alleviate itching.  She denies difficulty swallowing or breathing.  She denies fevers, chills, sweats, headache, nausea, vomiting, constipation, or diarrhea.  Medications: I have reviewed the patient's current medications.  Allergies:  Allergies  Allergen Reactions  . Prednisone Swelling and Other (See Comments)    " I think it was this that made my tongue swell "--Patient has received steroid injections without any issues--LC 04/12/2019    Past Medical History:  Diagnosis Date  . Arthritis 2003  . Breast cancer ([place]) 11/25/2017   Right breast  . Cataract 2016   "early" per patient-no surgery yet   . GERD (gastroesophageal reflux disease) 2003   per patient report  . Hypothyroidism 10/20/2018    Past Surgical History:  Procedure Laterality Date  . ABDOMINAL HYSTERECTOMY  2002   Total  hysterectomy in 2002  . BREAST LUMPECTOMY WITH RADIOACTIVE SEED AND SENTINEL LYMPH NODE BIOPSY Right 06/28/2018   Procedure: BREAST LUMPECTOMY WITH RADIOACTIVE SEED AND SENTINEL LYMPH NODE BIOPSY;  Surgeon: [name], MD;  Location: [facility];  Service: General;  Laterality: Right;  . PORTACATH PLACEMENT N/A 12/29/2017   Procedure: INSERTION PORT-A-CATH LEFT SUBCLAVIAN;  Surgeon: [name], MD;  Location[facility];  Service: General;  Laterality: N/A;    Family History  Problem  Relation Age of Onset  . Diabetes Father   . Heart attack Father     Social History   Socioeconomic History  . Marital status: Married    Spouse name: Not on file  . Number of children: Not on file  . Years of education: Not on file  . Highest education level: Not on file  Occupational History  . Not on file  Social Needs  . Financial resource strain: Not on file  . Food insecurity    Worry: Not on file    Inability: Not on file  . Transportation needs    Medical: Not on file    Non-medical: Not on file  Tobacco Use  . Smoking status: Never Smoker  . Smokeless tobacco: Never Used  Substance and Sexual Activity  . Alcohol use: Never    Frequency: Never  . Drug use: Never  . Sexual activity: Not on file  Lifestyle  . Physical activity    Days per week: Not on file    Minutes per session: Not on file  . Stress: Not on file  Relationships  . Social Herbalist on phone: Not on file    Gets together: Not on file    Attends religious service: Not on file    Active member of club or organization: Not on file    Attends meetings of clubs or organizations: Not on file    Relationship status: Not on file  . Intimate partner violence    Fear of current or ex partner: Not on file    Emotionally abused: Not on file    Physically abused: Not on file    Forced sexual activity: Not on file  Other Topics Concern  . Not on file  Social History Narrative  . Not on file    Past Medical History, Surgical history, Social history, and Family history were reviewed and updated as appropriate.   Please see review of systems for further details on the patient's review from today.   Review of Systems:  Review of Systems  Constitutional: Negative for chills, diaphoresis and fever.  HENT: Negative for facial swelling and trouble swallowing.   Respiratory: Negative for cough, chest tightness and shortness of breath.   Cardiovascular: Negative for chest pain.  Skin: Positive  for rash.    Objective:   Physical Exam:  BP 137/83 (BP Location: Left Arm, Patient Position: Sitting)   Pulse 65   Temp 98.5 F (36.9 C) (Oral)   Resp 17   Ht 5\' 2"  (1.575 m)   Wt 174 lb 11.2 oz (79.2 kg)   SpO2 98%   BMI 31.95 kg/m  ECOG: 2  Physical Exam Constitutional:      General: She is not in acute distress.    Appearance: She is not diaphoretic.  HENT:     Head: Normocephalic and atraumatic.  Cardiovascular:     Rate and Rhythm: Normal rate and regular rhythm.     Heart sounds: Normal heart sounds.  No murmur. No friction rub. No gallop.   Pulmonary:     Effort: Pulmonary effort is normal. No respiratory distress.     Breath sounds: Normal breath sounds. No wheezing or rales.  Skin:    General: Skin is warm and dry.     Findings: Erythema and rash present.     Comments: The patient has an erythematous rash over her bilateral upper extremities, chest, abdomen, back, scalp, and thighs.  The area is slightly raised and scaling.  The patient's hands are swollen.  Neurological:     Mental Status: She is alert.     Coordination: Coordination normal.     Gait: Gait normal.  Psychiatric:        Mood and Affect: Mood normal.        Behavior: Behavior normal.        Thought Content: Thought content normal.        Judgment: Judgment normal.       Lab Review:     Component Value Date/Time   NA 139 03/22/2019 0806   K 4.3 03/22/2019 0806   CL 107 03/22/2019 0806   CO2 23 03/22/2019 0806   GLUCOSE 95 03/22/2019 0806   BUN 10 03/22/2019 0806   CREATININE 0.93 03/22/2019 0806   CREATININE 0.89 02/08/2019 0805   CALCIUM 8.8 (L) 03/22/2019 0806   PROT 7.5 03/22/2019 0806   ALBUMIN 3.7 03/22/2019 0806   AST 17 03/22/2019 0806   AST 17 02/08/2019 0805   ALT 15 03/22/2019 0806   ALT 13 02/08/2019 0805   ALKPHOS 75 03/22/2019 0806   BILITOT 0.3 03/22/2019 0806   BILITOT 0.3 02/08/2019 0805   GFRNONAA >60 03/22/2019 0806   GFRNONAA >60 02/08/2019 0805   GFRAA  >60 03/22/2019 0806   GFRAA >60 02/08/2019 0805       Component Value Date/Time   WBC 4.6 03/22/2019 0806   WBC 4.2 03/02/2019 0816   RBC 3.88 03/22/2019 0806   HGB 12.8 03/22/2019 0806   HCT 39.4 03/22/2019 0806   PLT 168 03/22/2019 0806   MCV 101.5 (H) 03/22/2019 0806   MCH 33.0 03/22/2019 0806   MCHC 32.5 03/22/2019 0806   RDW 14.0 03/22/2019 0806   LYMPHSABS 0.8 03/22/2019 0806   MONOABS 0.5 03/22/2019 0806   EOSABS 0.1 03/22/2019 0806   BASOSABS 0.0 03/22/2019 0806   -------------------------------  Imaging from last 24 hours (if applicable):  Radiology interpretation: No results found.      This patient was seen with Dr. Lindi Adie with my treatment plan reviewed with him. He expressed agreement with my medical management of this patient.   Addendum: I have seen and examined this patient and agree with the above-mentioned documentation. Severe maculopapular rash diffuse coalescing rash throughout her body including her scalp which did not respond very well to Medrol Dosepak. 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 Based on NCCN guidelines, we prescribed a topical cortisone cream Start 1 mg/kg prednisone daily Reassess next week for improvement. If no improvement then we may have to consider admitting her to the hospital for IV steroids.

## 2019-04-22 NOTE — Progress Notes (Signed)
Pt presents with blotchy, itchy, unraised red rash on bilat arms, legs, torso, and back.  Reports minimal to no improvement after taking steroid injection/tablets.  Denies resp issues or trouble swallowing.  Denies recent exposure to any allergens or any newly developed allergies.  Afebrile.  Possible blistering/drainage from spot on L arm with some swelling along L arm and in bilat hands.

## 2019-04-25 ENCOUNTER — Other Ambulatory Visit: Payer: Self-pay | Admitting: Oncology

## 2019-04-26 ENCOUNTER — Telehealth: Payer: Self-pay | Admitting: Medical

## 2019-04-26 ENCOUNTER — Inpatient Hospital Stay (HOSPITAL_BASED_OUTPATIENT_CLINIC_OR_DEPARTMENT_OTHER): Payer: Medicare Other | Admitting: Medical

## 2019-04-26 ENCOUNTER — Other Ambulatory Visit: Payer: Self-pay

## 2019-04-26 VITALS — BP 139/89 | HR 66 | Temp 98.9°F | Resp 18 | Ht 62.0 in | Wt 174.3 lb

## 2019-04-26 DIAGNOSIS — C50411 Malignant neoplasm of upper-outer quadrant of right female breast: Secondary | ICD-10-CM

## 2019-04-26 DIAGNOSIS — L270 Generalized skin eruption due to drugs and medicaments taken internally: Secondary | ICD-10-CM

## 2019-04-26 DIAGNOSIS — Z171 Estrogen receptor negative status [ER-]: Secondary | ICD-10-CM

## 2019-04-26 NOTE — Progress Notes (Signed)
Symptoms Management Clinic Progress Note   Veronica Price [id number] [birth year] 73 y.o.  Veronica Price is managed by Dr. 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 Jana Hakim and is status post cycle 7, day 1 of Keytruda which was dosed on 03/22/2019.  She is scheduled to be seen in follow-up on 05/03/2019.  Skin rash secondary to Keytruda: Patient was seen with Dr. Nicholas Lose at her last visit.  She continues on prednisone at 1 mg/kg/day (80 mg daily) with plans for a 4-week taper.  She also continues on a prescription for Topicort 0.25% cream. She has had significant improvement. This was discussed with Dr. Lindi Adie. Veronica Price will decrease prednisone to 60 mg once daily and will continue Topicort 0.25% cream. She was told to increase prednisone to 80 mg once daily should she have a flare of her rash. She is scheduled to see Dr. Jana Hakim in follow-up on 05/03/2019.    Please see After Visit Summary for patient specific instructions.  Future Appointments  Date Time Provider [place]  05/03/2019  8:45 AM [facility] LAB 2 [facility] None  05/03/2019  9:00 AM [facility] [place] FLUSH [facility] None  05/03/2019  9:30 AM [name], [name], MD [facility] None  05/03/2019 10:00 AM [facility] INFUSION [facility] None    No orders of the defined types were placed in this encounter.      Subjective:   Patient ID:  Veronica Price is a 73 y.o. (DOB 11/26/1945) female.  Chief Complaint:  Chief Complaint  Patient presents with  . Follow-up    Rash    HPI Veronica Price  is a 73 year old female with a diagnosis of an ER negative malignant neoplasm of the right breast. She continues to be  managed by Dr. Jana Hakim and is status post cycle 7, day 1 of Keytruda which was dosed on 03/22/2019.  She was seen with Dr. Lindi Adie on 04/22/2019 for a rash over the majority of her body for the prior 2 to 3 weeks.  She was placed on a Medrol Dosepak.  She believes that her rash worsened while on the Medrol Dosepak.  She was seen at urgent care and was given a steroid shot.  She continues on prednisone at 1 mg/kg/day (80 mg daily) with plans for a 4-week taper.  She also continues on a prescription for Topicort 0.25% cream. She has had significant improvement.   She had been putting alcohol on a washcloth and rubbing her arms with this to help alleviate itching.  She is no longer doing this.  Medications: I have reviewed the patient's current medications.  Allergies:  Allergies  Allergen Reactions  . Prednisone Swelling and Other (See Comments)    " I think it was this that made my tongue swell "--Patient has received steroid injections without any issues--LC 04/12/2019    Past Medical History:  Diagnosis Date  . Arthritis 2003  . Breast cancer ([place]) 11/25/2017   Right breast  . Cataract 2016   "early" per patient-no surgery yet   . GERD (gastroesophageal reflux disease) 2003   per patient report  . Hypothyroidism 10/20/2018    Past Surgical History:  Procedure Laterality Date  . ABDOMINAL HYSTERECTOMY  2002   Total hysterectomy in  2002  . BREAST LUMPECTOMY WITH RADIOACTIVE SEED AND SENTINEL LYMPH NODE BIOPSY Right 06/28/2018   Procedure: BREAST LUMPECTOMY WITH RADIOACTIVE SEED AND SENTINEL LYMPH NODE BIOPSY;  Surgeon: [name], MD;  Location: [address];  Service: General;  Laterality: Right;  . PORTACATH PLACEMENT N/A 12/29/2017   Procedure: INSERTION PORT-A-CATH LEFT SUBCLAVIAN;  Surgeon: [name], MD;  Location: [facility] OR;  Service: General;  Laterality: N/A;    Family History  Problem Relation Age of Onset  . Diabetes Father   . Heart attack Father      Social History   Socioeconomic History  . Marital status: Married    Spouse name: Not on file  . Number of children: Not on file  . Years of education: Not on file  . Highest education level: Not on file  Occupational History  . Not on file  Social Needs  . Financial resource strain: Not on file  . Food insecurity    Worry: Not on file    Inability: Not on file  . Transportation needs    Medical: Not on file    Non-medical: Not on file  Tobacco Use  . Smoking status: Never Smoker  . Smokeless tobacco: Never Used  Substance and Sexual Activity  . Alcohol use: Never    Frequency: Never  . Drug use: Never  . Sexual activity: Not on file  Lifestyle  . Physical activity    Days per week: Not on file    Minutes per session: Not on file  . Stress: Not on file  Relationships  . Social Herbalist on phone: Not on file    Gets together: Not on file    Attends religious service: Not on file    Active member of club or organization: Not on file    Attends meetings of clubs or organizations: Not on file    Relationship status: Not on file  . Intimate partner violence    Fear of current or ex partner: Not on file    Emotionally abused: Not on file    Physically abused: Not on file    Forced sexual activity: Not on file  Other Topics Concern  . Not on file  Social History Narrative  . Not on file    Past Medical History, Surgical history, Social history, and Family history were reviewed and updated as appropriate.   Please see review of systems for further details on the patient's review from today.   Review of Systems:  Review of Systems  Constitutional: Negative for chills, diaphoresis and fever.  HENT: Negative for facial swelling and trouble swallowing.   Respiratory: Negative for cough, chest tightness and shortness of breath.   Cardiovascular: Negative for chest pain.  Skin: Positive for rash.    Objective:   Physical Exam:  BP 139/89 (BP Location:  Left Arm, Patient Position: Sitting)   Pulse 66   Temp 98.9 F (37.2 C) (Temporal)   Resp 18   Ht 5\' 2"  (1.575 m)   Wt 174 lb 4.8 oz (79.1 kg)   SpO2 99%   BMI 31.88 kg/m  ECOG: 0  Physical Exam Constitutional:      Appearance: Normal appearance.  HENT:     Head: Normocephalic and atraumatic.  Skin:    Findings: Erythema and rash present.     Comments: Improving rash over the chest, scalp, abdomen, upper and lower extremities.  Neurological:     Mental Status: She is alert.  Coordination: Coordination normal.     Gait: Gait normal.  Psychiatric:        Mood and Affect: Mood normal.        Behavior: Behavior normal.        Thought Content: Thought content normal.        Judgment: Judgment normal.       Lab Review:     Component Value Date/Time   NA 139 03/22/2019 0806   K 4.3 03/22/2019 0806   CL 107 03/22/2019 0806   CO2 23 03/22/2019 0806   GLUCOSE 95 03/22/2019 0806   BUN 10 03/22/2019 0806   CREATININE 0.93 03/22/2019 0806   CREATININE 0.89 02/08/2019 0805   CALCIUM 8.8 (L) 03/22/2019 0806   PROT 7.5 03/22/2019 0806   ALBUMIN 3.7 03/22/2019 0806   AST 17 03/22/2019 0806   AST 17 02/08/2019 0805   ALT 15 03/22/2019 0806   ALT 13 02/08/2019 0805   ALKPHOS 75 03/22/2019 0806   BILITOT 0.3 03/22/2019 0806   BILITOT 0.3 02/08/2019 0805   GFRNONAA >60 03/22/2019 0806   GFRNONAA >60 02/08/2019 0805   GFRAA >60 03/22/2019 0806   GFRAA >60 02/08/2019 0805       Component Value Date/Time   WBC 4.6 03/22/2019 0806   WBC 4.2 03/02/2019 0816   RBC 3.88 03/22/2019 0806   HGB 12.8 03/22/2019 0806   HCT 39.4 03/22/2019 0806   PLT 168 03/22/2019 0806   MCV 101.5 (H) 03/22/2019 0806   MCH 33.0 03/22/2019 0806   MCHC 32.5 03/22/2019 0806   RDW 14.0 03/22/2019 0806   LYMPHSABS 0.8 03/22/2019 0806   MONOABS 0.5 03/22/2019 0806   EOSABS 0.1 03/22/2019 0806   BASOSABS 0.0 03/22/2019 0806   -------------------------------  Imaging from last 24 hours (if  applicable):  Radiology interpretation: No results found.      This case was discussed with Dr. Lindi Adie. He expresses agreement with my management of this patient.

## 2019-04-26 NOTE — Telephone Encounter (Signed)
No los per 7/21. °

## 2019-04-26 NOTE — Progress Notes (Signed)
Pt returning to [facility] for recheck of rash after high dose steroid tx at home.  Pt reports feeling much better without any itching or redness or worsening of her rash.  Rash appears drier and much less inflamed.  Denies SOB or CP or trouble swallowing.  Afebrile.

## 2019-04-26 NOTE — Patient Instructions (Signed)

## 2019-05-02 NOTE — Progress Notes (Signed)
[facility]  [phone] [phone]     ID: SHANTY GINTY DOB: [id number]  MR#: [id number]  VEH#:[id number]  Patient Care Team: Renee Pain, FNP as PCP - General (Nurse Practitioner) Hemi Chacko, [name], MD as Consulting Physician (Oncology) [name], MD as Consulting Physician (General Surgery) [name], MD as Radiation Oncologist (Radiation Oncology) OTHER MD:   CHIEF COMPLAINT: Triple negative breast cancer  CURRENT TREATMENT:  On S1418 study, randomized to observaton; pembrolizumab off study   INTERVAL HISTORY: Quynh returns today for follow-up and treatment of her triple negative breast cancer.   She is receiving Pembrolizumab every three weeks with a dose due today. However, her scheduled dose on 04/12/2019 was held secondary to rash. She was prescribed a medrol dosepak, which ultimately made the rash worse. She was seen by Sandi Mealy, PA-C, who prescribed prednisone. Follow up with him on 04/26/2019 showed significant improvement.  We are following her TSH levels with a repeat due today. Lab Results  Component Value Date   TSH 9.673 (H) 03/02/2019   TSH 14.475 (H) 02/08/2019   TSH 4.211 (H) 12/27/2018   TSH 3.945 11/15/2018   TSH 9.866 (H) 10/19/2018   Since her last visit, she has not undergone any additional studies. Her last breast imaging was a bilateral MRI on 06/16/2018.   REVIEW OF SYSTEMS: Quanetta reports her rash continues to clear up very well. She states she is still taking the prednisone. She reports some dizziness yesterday, some constipation, and port-site irritation. She lives at home with her husband, who she states drove her here today. She reports both of them do a bit of the shopping. She reports two recent falls; one shortly after her rash developed, and the other this past Sunday. A detailed review of systems was otherwise entirely negative.   HISTORY OF CURRENT ILLNESS: Veronica Price  initially palpated a mass to her right breast in February 2019.   She brought her to her primary care physician's attention and was set up for bilateral diagnostic mammography with tomography and right breast ultrasonography on 11/25/2017 showing: a 3.2 x 3.7 cm lobulated mass in the UOQ of the right breast; there was a 3 cm calcified retroareolar mass in the left breast. On right breast ultrasonography there was a complex mostly solid 2.4 x 3.1 cm mass at 10 o'clock and multiple enlarged right axillary lymph nodes, the largest measuring 1.6 x 2.4.  Accordingly on 11/27/2017 the patient proceeded to biopsy of the right breast area in question, as well as an attempted axillary node biopsy. The pathology from this procedure showed ([id number]):  high grade infiltrating ductal carcinoma with no lymphovascular invasion.  The attempted axillary node biopsy showed fat, no nodal tissue.  Prognostic indicators were: estrogen and progesterone receptor negative,. Proliferation marker Ki67 at >80%. HER2 negative by immunohistochemistry at 0%  CT scans of the chest abdomen and pelvis completed on 12/10/2017 showed A complex solid and cystic 3.5 cm right breast mass with several bilateral axillary lymph nodes, the largest in the right axilla measured 12 mm. There was no evidence of visceral metastatic disease.   She had a bone scan on 12/10/2017 that showed two areas of increased uptake overlying the lateral aspects of the T11 vertebrae. Review of the CT scan of the chest revealed sclerosis of the T11 pedicles concerning for bone metastases.  However subsequent spinal MRI (I do not have that report) read this out as arthritis, not a metastatic deposit  The patient had a echocardiogram completed on 3/72019 with results showing: left ventricular ejection fraction of 55-60% with mild concentric hypertrophy. There was mild to moderate mitral and mild tricuspid regurgitation with mild pulmonary hypertension. The aortic valve was  mildly thickened with trace aortic regurgitation.   The patient's subsequent history is as detailed below.   PAST MEDICAL HISTORY: Past Medical History:  Diagnosis Date  . Arthritis 2003  . Breast cancer ([place]) 11/25/2017   Right breast  . Cataract 2016   "early" per patient-no surgery yet   . GERD (gastroesophageal reflux disease) 2003   per patient report  . Hypothyroidism 10/20/2018    PAST SURGICAL HISTORY: Past Surgical History:  Procedure Laterality Date  . ABDOMINAL HYSTERECTOMY  2002   Total hysterectomy in 2002  . BREAST LUMPECTOMY WITH RADIOACTIVE SEED AND SENTINEL LYMPH NODE BIOPSY Right 06/28/2018   Procedure: BREAST LUMPECTOMY WITH RADIOACTIVE SEED AND SENTINEL LYMPH NODE BIOPSY;  Surgeon: [name], MD;  Location: [facility];  Service: General;  Laterality: Right;  . PORTACATH PLACEMENT N/A 12/29/2017   Procedure: INSERTION PORT-A-CATH LEFT SUBCLAVIAN;  Surgeon: [name], MD;  Location: [facility];  Service: General;  Laterality: N/A;    FAMILY HISTORY Family History  Problem Relation Age of Onset  . Diabetes Father   . Heart attack Father    She notes that her father died from a possible MI in his early 42's Her mother is still alive and will be 01 January 2018!  The patient has 2 brothers and 2 sisters. Her brother had prostate cancer possibly agent orange related. She has a cousin who was recently diagnosed with stomach cancer. Her maternal grandmother died from colon cancer. She has a maternal uncle with bone cancer. She denies family hx of breast or ovarian cancer.    GYNECOLOGIC HISTORY:  Menarche: 73 years old Age at first live birth: 73 years old [place] P3 LMP: at age 28 Contraceptive: OCP HRT   Hysterectomy? 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 She also worked at [employer]. She lives at home with her husband, Gwyndolyn Saxon who is  a retired Building control surveyor, her husband works part time driving an adult day care bus. Her daughter, [name] is a Multimedia programmer. Her daughter [name] lives in [place], [place] and works as a Therapist, sports for the [place]. Her son Jo-Ann Johanning, lives in [place], and works as a Administrator.  The patient has 3 grandchildren and no great-grandchildren. She is of baptist faith.     ADVANCED DIRECTIVES:    HEALTH MAINTENANCE: Social History   Tobacco Use  . Smoking status: Never Smoker  . Smokeless tobacco: Never Used  Substance Use Topics  . Alcohol use: Never    Frequency: Never  . Drug use: Never     Colonoscopy: UTD; last year (2018)  PAP:  Bone density:    Allergies  Allergen Reactions  . Prednisone Swelling and Other (See Comments)    " I think it was this that made my tongue swell "--Patient has received steroid injections without any issues--[facility] 04/12/2019    Current Outpatient Medications  Medication Sig Dispense Refill  . desoximetasone (TOPICORT) 0.25 % cream Apply 1 application topically 2 (two) times daily. 200 g 1  . hydrOXYzine (ATARAX/VISTARIL) 25 MG tablet Take 25 mg by mouth 3 (three) times daily.     Marland Kitchen levothyroxine (SYNTHROID)  50 MCG tablet TAKE 1 TABLET BY MOUTH DAILY BEFORE BREAKFAST 30 tablet 1  . lidocaine-prilocaine (EMLA) cream APPLY  CREAM TOPICALLY AS NEEDED 30 g 0  . naproxen sodium (ALEVE) 220 MG tablet Take 220 mg by mouth daily as needed (for pain).    . predniSONE (DELTASONE) 20 MG tablet Take 4 tablets (80 mg total) by mouth daily with breakfast. Continue until instructed to taper doses by MD. 120 tablet 0   No current facility-administered medications for this visit.    Facility-Administered Medications Ordered in Other Visits  Medication Dose Route Frequency Provider Last Rate Last Dose  . heparin lock flush 100 unit/mL  250 Units Intracatheter Once Naveena Eyman, [name], MD        OBJECTIVE: Middle-aged African-American woman who appears stated age  There were no  vitals filed for this visit.   There is no height or weight on file to calculate BMI.   Wt Readings from Last 3 Encounters:  04/26/19 174 lb 4.8 oz (79.1 kg)  04/22/19 174 lb 11.2 oz (79.2 kg)  04/12/19 174 lb 11.2 oz (79.2 kg)  ECOG FS:1 - Symptomatic but completely ambulatory  Sclerae unicteric, EOMs intact Wearing a mask No cervical or supraclavicular adenopathy Lungs no rales or rhonchi Heart regular rate and rhythm Abd soft, nontender, positive bowel sounds MSK no focal spinal tenderness, no upper extremity lymphedema Neuro: nonfocal, well oriented, appropriate affect Breasts: The right breast is status post radiation and lumpectomy.  There is no hyperpigmentation there is no evidence of local recurrence.  Left breast is benign.  Both axillae are benign. Skin: The rash is no longer palpable.  There are still dusky areas where the rash was present previously    LAB RESULTS:  CMP     Component Value Date/Time   NA 139 03/22/2019 0806   K 4.3 03/22/2019 0806   CL 107 03/22/2019 0806   CO2 23 03/22/2019 0806   GLUCOSE 95 03/22/2019 0806   BUN 10 03/22/2019 0806   CREATININE 0.93 03/22/2019 0806   CREATININE 0.89 02/08/2019 0805   CALCIUM 8.8 (L) 03/22/2019 0806   PROT 7.5 03/22/2019 0806   ALBUMIN 3.7 03/22/2019 0806   AST 17 03/22/2019 0806   AST 17 02/08/2019 0805   ALT 15 03/22/2019 0806   ALT 13 02/08/2019 0805   ALKPHOS 75 03/22/2019 0806   BILITOT 0.3 03/22/2019 0806   BILITOT 0.3 02/08/2019 0805   GFRNONAA >60 03/22/2019 0806   GFRNONAA >60 02/08/2019 0805   GFRAA >60 03/22/2019 0806   GFRAA >60 02/08/2019 0805    No results found for: TOTALPROTELP, ALBUMINELP, A1GS, A2GS, BETS, BETA2SER, GAMS, MSPIKE, SPEI  No results found for: KPAFRELGTCHN, LAMBDASER, KAPLAMBRATIO  Lab Results  Component Value Date   WBC 4.6 03/22/2019   NEUTROABS 3.2 03/22/2019   HGB 12.8 03/22/2019   HCT 39.4 03/22/2019   MCV 101.5 (H) 03/22/2019   PLT 168 03/22/2019     @LASTCHEMISTRY @  No results found for: LABCA2  No components found for: PPJKDT267  No results for input(s): INR in the last 168 hours.  No results found for: LABCA2  No results found for: TIW580  No results found for: DXI338  No results found for: SNK539  No results found for: CA2729  No components found for: HGQUANT  No results found for: CEA1 / No results found for: CEA1   No results found for: AFPTUMOR  No results found for: [place]  No results found for: PSA1  No  visits with results within 3 Day(s) from this visit.  Latest known visit with results is:  Appointment on 03/22/2019  Component Date Value Ref Range Status  . Sodium 03/22/2019 139  135 - 145 mmol/L Final  . Potassium 03/22/2019 4.3  3.5 - 5.1 mmol/L Final  . Chloride 03/22/2019 107  98 - 111 mmol/L Final  . CO2 03/22/2019 23  22 - 32 mmol/L Final  . Glucose, Bld 03/22/2019 95  70 - 99 mg/dL Final  . BUN 03/22/2019 10  8 - 23 mg/dL Final  . Creatinine, Ser 03/22/2019 0.93  0.44 - 1.00 mg/dL Final  . Calcium 03/22/2019 8.8* 8.9 - 10.3 mg/dL Final  . Total Protein 03/22/2019 7.5  6.5 - 8.1 g/dL Final  . Albumin 03/22/2019 3.7  3.5 - 5.0 g/dL Final  . AST 03/22/2019 17  15 - 41 U/L Final  . ALT 03/22/2019 15  0 - 44 U/L Final  . Alkaline Phosphatase 03/22/2019 75  38 - 126 U/L Final  . Total Bilirubin 03/22/2019 0.3  0.3 - 1.2 mg/dL Final  . GFR calc non Af Amer 03/22/2019 >60  >60 mL/min Final  . GFR calc Af Amer 03/22/2019 >60  >60 mL/min Final  . Anion gap 03/22/2019 9  5 - 15 Final   Performed at [facility], [place] [address], [place], [place] [address]  . WBC Count 03/22/2019 4.6  4.0 - 10.5 K/uL Final  . RBC 03/22/2019 3.88  3.87 - 5.11 MIL/uL Final  . Hemoglobin 03/22/2019 12.8  12.0 - 15.0 g/dL Final  . HCT 03/22/2019 39.4  36.0 - 46.0 % Final  . MCV 03/22/2019 101.5* 80.0 - 100.0 fL Final  . MCH 03/22/2019 33.0  26.0 - 34.0 pg Final  . MCHC 03/22/2019 32.5  30.0 -  36.0 g/dL Final  . RDW 03/22/2019 14.0  11.5 - 15.5 % Final  . Platelet Count 03/22/2019 168  150 - 400 K/uL Final  . nRBC 03/22/2019 0.0  0.0 - 0.2 % Final  . Neutrophils Relative % 03/22/2019 69  % Final  . Neutro Abs 03/22/2019 3.2  1.7 - 7.7 K/uL Final  . Lymphocytes Relative 03/22/2019 17  % Final  . Lymphs Abs 03/22/2019 0.8  0.7 - 4.0 K/uL Final  . Monocytes Relative 03/22/2019 11  % Final  . Monocytes Absolute 03/22/2019 0.5  0.1 - 1.0 K/uL Final  . Eosinophils Relative 03/22/2019 2  % Final  . Eosinophils Absolute 03/22/2019 0.1  0.0 - 0.5 K/uL Final  . Basophils Relative 03/22/2019 1  % Final  . Basophils Absolute 03/22/2019 0.0  0.0 - 0.1 K/uL Final  . Immature Granulocytes 03/22/2019 0  % Final  . Abs Immature Granulocytes 03/22/2019 0.01  0.00 - 0.07 K/uL Final   Performed at [facility], [place] [address], [place], [place] [address]    (this displays the last labs from the last 3 days)  No results found for: TOTALPROTELP, ALBUMINELP, A1GS, A2GS, BETS, BETA2SER, GAMS, MSPIKE, SPEI (this displays SPEP labs)  No results found for: KPAFRELGTCHN, LAMBDASER, KAPLAMBRATIO (kappa/lambda light chains)  No results found for: HGBA, HGBA2QUANT, HGBFQUANT, HGBSQUAN (Hemoglobinopathy evaluation)   No results found for: LDH  No results found for: IRON, TIBC, IRONPCTSAT (Iron and TIBC)  No results found for: FERRITIN  Urinalysis No results found for: COLORURINE, APPEARANCEUR, LABSPEC, PHURINE, GLUCOSEU, HGBUR, BILIRUBINUR, KETONESUR, PROTEINUR, UROBILINOGEN, NITRITE, LEUKOCYTESUR   STUDIES: No results found.  ELIGIBLE FOR AVAILABLE RESEARCH PROTOCOL:  SWOG [phone] (observation arm)  ASSESSMENT: 73 y.o. [name], [place] woman status post right breast upper outer quadrant biopsy 11/25/2017 for a clinical T2 N2, stage IIIc invasive ductal carcinoma, grade 3, triple negative, with an MIB-1 of 80%.  (1) staging studies:  (a) chest CT 12/10/2017 scan showed no  visceral metastatic disease  (b) bone scan 12/10/2017 showed uptake at T11, with sclerosis.  (c) CA-27-29 on 12/05/2017 was 19.0  (d) spinal MRI in [place] reportedly showed only arthrtis at T11  (2) neoadjuvant chemotherapy consisting of carboplatin/ paclitaxel x 12 (received 9) started 01/04/2018, discontinued 03/15/2018, followed by cyclophosphamide and doxorubicin given every 3 weeks x 4 starting 04/05/2018, completed 06/14/2018  (a) carboplatin/paclitaxel changed to carboplatin/gemcitabine after 8 cycles due to neuroapthy  (b) only able to tolerate 1 cycle of Gemcitabine/Carboplatin due to rash  (a) echocardiogram on 04/02/18 demonstrates a LVEF of 60-65%  (3) status post right lumpectomy and sentinel lymph node sampling on 06/28/2018 showing a residual 2.2 cm, grade 3 invasive ductal carcinoma, with negative margins and 0 of 3 sentinel lymph nodes removed involved (ypT2 ypN0)  (4) adjuvant radiation through the [facility] group in [place] mid November through mid December 2019  (5) participating in the pembrolizumab study S1418, randomized to observation  (6) pembrolizumab started (not as part of study) 11/15/2018, to be repeated every 21 days for 1 year  PLAN:  Anara looks fine and is ready to receive a dose of pembrolizumab today.  She is much improved as far as the rash is concerned.  She is still on fairly high doses of prednisone, namely 60 mg daily.  I have written out a taper for her.  She will receive 60 mg for 3 days 40 mg for 3 days 20 mg for 3 days then 10 mg for 3 days, then every other day for 6 days and then stop.  She has a specific dates written so hopefully will be no confusion.  I suspect her rash will recrudesce and we will not be able to give any further [place].  I am seeing her again in 3weeks.  If we do decide to stop the pembrolizumab we will remove the port and initiate long-term follow-up  She knows to call for any other issue that may develop before the next visit.    [name]. Bindu Docter, MD  05/02/19 9:34 AM Medical Oncology and Hematology [facility] [address] [place], [place] [address] Tel. [phone]    Fax. [phone]   I, Wilburn Mylar, am acting as scribe for Dr. [name]. Maryhelen Lindler.  I, Lurline Del MD, have reviewed the above documentation for accuracy and completeness, and I agree with the above.

## 2019-05-03 ENCOUNTER — Inpatient Hospital Stay: Payer: Medicare Other

## 2019-05-03 ENCOUNTER — Other Ambulatory Visit: Payer: Self-pay

## 2019-05-03 ENCOUNTER — Inpatient Hospital Stay (HOSPITAL_BASED_OUTPATIENT_CLINIC_OR_DEPARTMENT_OTHER): Payer: Medicare Other | Admitting: Oncology

## 2019-05-03 VITALS — BP 141/69 | HR 66 | Temp 99.1°F | Resp 17 | Ht 62.0 in | Wt 176.2 lb

## 2019-05-03 DIAGNOSIS — Z171 Estrogen receptor negative status [ER-]: Secondary | ICD-10-CM

## 2019-05-03 DIAGNOSIS — Z95828 Presence of other vascular implants and grafts: Secondary | ICD-10-CM

## 2019-05-03 DIAGNOSIS — C50411 Malignant neoplasm of upper-outer quadrant of right female breast: Secondary | ICD-10-CM

## 2019-05-03 DIAGNOSIS — F341 Dysthymic disorder: Secondary | ICD-10-CM

## 2019-05-03 DIAGNOSIS — Z5112 Encounter for antineoplastic immunotherapy: Secondary | ICD-10-CM | POA: Diagnosis not present

## 2019-05-03 DIAGNOSIS — Z79899 Other long term (current) drug therapy: Secondary | ICD-10-CM | POA: Diagnosis not present

## 2019-05-03 DIAGNOSIS — R21 Rash and other nonspecific skin eruption: Secondary | ICD-10-CM | POA: Diagnosis not present

## 2019-05-03 DIAGNOSIS — M19241 Secondary osteoarthritis, right hand: Secondary | ICD-10-CM

## 2019-05-03 DIAGNOSIS — M19242 Secondary osteoarthritis, left hand: Secondary | ICD-10-CM

## 2019-05-03 DIAGNOSIS — E039 Hypothyroidism, unspecified: Secondary | ICD-10-CM

## 2019-05-03 LAB — CBC WITH DIFFERENTIAL/PLATELET
Abs Immature Granulocytes: 0.07 10*3/uL (ref 0.00–0.07)
Basophils Absolute: 0 10*3/uL (ref 0.0–0.1)
Basophils Relative: 1 %
Eosinophils Absolute: 0.1 10*3/uL (ref 0.0–0.5)
Eosinophils Relative: 1 %
HCT: 37.7 % (ref 36.0–46.0)
Hemoglobin: 12.5 g/dL (ref 12.0–15.0)
Immature Granulocytes: 1 %
Lymphocytes Relative: 17 %
Lymphs Abs: 1.3 10*3/uL (ref 0.7–4.0)
MCH: 32.9 pg (ref 26.0–34.0)
MCHC: 33.2 g/dL (ref 30.0–36.0)
MCV: 99.2 fL (ref 80.0–100.0)
Monocytes Absolute: 0.7 10*3/uL (ref 0.1–1.0)
Monocytes Relative: 9 %
Neutro Abs: 5.5 10*3/uL (ref 1.7–7.7)
Neutrophils Relative %: 71 %
Platelets: 176 10*3/uL (ref 150–400)
RBC: 3.8 MIL/uL — ABNORMAL LOW (ref 3.87–5.11)
RDW: 14.6 % (ref 11.5–15.5)
WBC: 7.7 10*3/uL (ref 4.0–10.5)
nRBC: 0 % (ref 0.0–0.2)

## 2019-05-03 LAB — COMPREHENSIVE METABOLIC PANEL
ALT: 23 U/L (ref 0–44)
AST: 16 U/L (ref 15–41)
Albumin: 3.6 g/dL (ref 3.5–5.0)
Alkaline Phosphatase: 56 U/L (ref 38–126)
Anion gap: 8 (ref 5–15)
BUN: 17 mg/dL (ref 8–23)
CO2: 27 mmol/L (ref 22–32)
Calcium: 9 mg/dL (ref 8.9–10.3)
Chloride: 104 mmol/L (ref 98–111)
Creatinine, Ser: 0.86 mg/dL (ref 0.44–1.00)
GFR calc Af Amer: >60 mL/min (ref >60)
GFR calc non Af Amer: >60 mL/min (ref >60)
Glucose, Bld: 75 mg/dL (ref 70–99)
Potassium: 3.9 mmol/L (ref 3.5–5.1)
Sodium: 139 mmol/L (ref 135–145)
Total Bilirubin: 0.5 mg/dL (ref 0.3–1.2)
Total Protein: 6.8 g/dL (ref 6.5–8.1)

## 2019-05-03 LAB — TSH: TSH: 4.52 u[IU]/mL — ABNORMAL HIGH (ref 0.308–3.960)

## 2019-05-03 MED ORDER — SODIUM CHLORIDE 0.9% FLUSH
10.0000 mL | Freq: Once | INTRAVENOUS | Status: AC
  Administered 2019-05-03: 10 mL
  Filled 2019-05-03: qty 10

## 2019-05-03 MED ORDER — SODIUM CHLORIDE 0.9 % IV SOLN
200.0000 mg | Freq: Once | INTRAVENOUS | Status: AC
  Administered 2019-05-03: 200 mg via INTRAVENOUS
  Filled 2019-05-03: qty 8

## 2019-05-03 MED ORDER — HEPARIN SOD (PORK) LOCK FLUSH 100 UNIT/ML IV SOLN
500.0000 [IU] | Freq: Once | INTRAVENOUS | Status: AC | PRN
  Administered 2019-05-03: 12:00:00 500 [IU]
  Filled 2019-05-03: qty 5

## 2019-05-03 MED ORDER — SODIUM CHLORIDE 0.9% FLUSH
10.0000 mL | INTRAVENOUS | Status: DC | PRN
  Administered 2019-05-03: 12:00:00 10 mL
  Filled 2019-05-03: qty 10

## 2019-05-03 MED ORDER — SODIUM CHLORIDE 0.9 % IV SOLN
Freq: Once | INTRAVENOUS | Status: AC
  Administered 2019-05-03: 11:00:00 via INTRAVENOUS
  Filled 2019-05-03: qty 250

## 2019-05-03 NOTE — Patient Instructions (Signed)

## 2019-05-03 NOTE — Patient Instructions (Signed)
[facility] Discharge Instructions for Patients Receiving Chemotherapy  Today you received the following chemotherapy agents:  Keytruda.  To help prevent nausea and vomiting after your treatment, we encourage you to take your nausea medication as directed.   If you develop nausea and vomiting that is not controlled by your nausea medication, call the clinic.   BELOW ARE SYMPTOMS THAT SHOULD BE REPORTED IMMEDIATELY:  *FEVER GREATER THAN 100.5 F  *CHILLS WITH OR WITHOUT FEVER  NAUSEA AND VOMITING THAT IS NOT CONTROLLED WITH YOUR NAUSEA MEDICATION  *UNUSUAL SHORTNESS OF BREATH  *UNUSUAL BRUISING OR BLEEDING  TENDERNESS IN MOUTH AND THROAT WITH OR WITHOUT PRESENCE OF ULCERS  *URINARY PROBLEMS  *BOWEL PROBLEMS  UNUSUAL RASH Items with * indicate a potential emergency and should be followed up as soon as possible.  Feel free to call the clinic should you have any questions or concerns. The clinic phone number is [phone].  Please show the CHEMO ALERT CARD at check-in to the Emergency Department and triage nurse.    

## 2019-05-12 ENCOUNTER — Other Ambulatory Visit: Payer: Self-pay | Admitting: Medical

## 2019-05-12 DIAGNOSIS — R21 Rash and other nonspecific skin eruption: Secondary | ICD-10-CM

## 2019-05-12 NOTE — Telephone Encounter (Signed)
Per md dictation- he stated :  She is much improved as far as the rash is concerned.  She is still on fairly high doses of prednisone, namely 60 mg daily.  I have written out a taper for her.  She will receive 60 mg for 3 days 40 mg for 3 days 20 mg for 3 days then 10 mg for 3 days, then every other day for 6 days and then stop.  She has a specific dates written so hopefully will be no confusion.

## 2019-05-12 NOTE — Telephone Encounter (Addendum)
Please advise dose?  Taper?  Refill? Next Scheduled F/U 05-24-2019 at 10:30 am.

## 2019-05-22 NOTE — Progress Notes (Signed)
[facility]  [phone] [phone]     ID: KIRBIE STODGHILL DOB: March 21, 1946  MR#: [id number]  JYN#:[id number]  Patient Care Team: Renee Pain, FNP as PCP - General (Nurse Practitioner) Jakiyah Stepney, [name], MD as Consulting Physician (Oncology) [name], MD as Consulting Physician (General Surgery) [name], MD as Radiation Oncologist (Radiation Oncology) OTHER MD:   CHIEF COMPLAINT: Triple negative breast cancer  CURRENT TREATMENT: Observation  INTERVAL HISTORY: Drianna returns today for follow-up and treatment of her triple negative breast cancer.   She is receiving Pembrolizumab every three weeks with a dose due today.  We have been holding her treatment because she developed a significant rash.  While the rash in itself may be an indication of response it was extremely uncomfortable for her and she required steroids and other supportive medicines.  She went off steroids this past weekend.  Tells me the rash is pretty much resolved.  We are following her TSH levels with a repeat due today. Lab Results  Component Value Date   TSH 4.520 (H) 05/03/2019   TSH 9.673 (H) 03/02/2019   TSH 14.475 (H) 02/08/2019   TSH 4.211 (H) 12/27/2018   TSH 3.945 11/15/2018   Since her last visit, she has not undergone any additional studies. Her last breast imaging was a bilateral MRI on 06/16/2018.   REVIEW OF SYSTEMS: Quinesha is feeling "normal".  She has no itching and no discomfort from the rash.  This is left slightly hyperpigmented areas over her arms.  There have been no unusual headaches visual changes cough phlegm production pleurisy shortness of breath or change in bowel or bladder habits.  Detailed review of systems today was otherwise stable.  HISTORY OF CURRENT ILLNESS: UNITY LUEPKE initially palpated a mass to her right breast in February 2019.   She brought her to her primary care physician's attention and was set up  for bilateral diagnostic mammography with tomography and right breast ultrasonography on 11/25/2017 showing: a 3.2 x 3.7 cm lobulated mass in the UOQ of the right breast; there was a 3 cm calcified retroareolar mass in the left breast. On right breast ultrasonography there was a complex mostly solid 2.4 x 3.1 cm mass at 10 o'clock and multiple enlarged right axillary lymph nodes, the largest measuring 1.6 x 2.4.  Accordingly on 11/27/2017 the patient proceeded to biopsy of the right breast area in question, as well as an attempted axillary node biopsy. The pathology from this procedure showed ([id number]):  high grade infiltrating ductal carcinoma with no lymphovascular invasion.  The attempted axillary node biopsy showed fat, no nodal tissue.  Prognostic indicators were: estrogen and progesterone receptor negative,. Proliferation marker Ki67 at >80%. HER2 negative by immunohistochemistry at 0%  CT scans of the chest abdomen and pelvis completed on 12/10/2017 showed A complex solid and cystic 3.5 cm right breast mass with several bilateral axillary lymph nodes, the largest in the right axilla measured 12 mm. There was no evidence of visceral metastatic disease.   She had a bone scan on 12/10/2017 that showed two areas of increased uptake overlying the lateral aspects of the T11 vertebrae. Review of the CT scan of the chest revealed sclerosis of the T11 pedicles concerning for bone metastases.  However subsequent spinal MRI (I do not have that report) read this out as arthritis, not a metastatic deposit  The patient had a echocardiogram completed on 3/72019 with results showing: left ventricular ejection fraction of 55-60% with  mild concentric hypertrophy. There was mild to moderate mitral and mild tricuspid regurgitation with mild pulmonary hypertension. The aortic valve was mildly thickened with trace aortic regurgitation.   The patient's subsequent history is as detailed below.   PAST MEDICAL HISTORY:  Past Medical History:  Diagnosis Date  . Arthritis 2003  . Breast cancer ([facility]) 11/25/2017   Right breast  . Cataract 2016   "early" per patient-no surgery yet   . GERD (gastroesophageal reflux disease) 2003   per patient report  . Hypothyroidism 10/20/2018    PAST SURGICAL HISTORY: Past Surgical History:  Procedure Laterality Date  . ABDOMINAL HYSTERECTOMY  2002   Total hysterectomy in 2002  . BREAST LUMPECTOMY WITH RADIOACTIVE SEED AND SENTINEL LYMPH NODE BIOPSY Right 06/28/2018   Procedure: BREAST LUMPECTOMY WITH RADIOACTIVE SEED AND SENTINEL LYMPH NODE BIOPSY;  Surgeon: [name], MD;  Location: [facility];  Service: General;  Laterality: Right;  . PORTACATH PLACEMENT N/A 12/29/2017   Procedure: INSERTION PORT-A-CATH LEFT SUBCLAVIAN;  Surgeon: [name], MD;  Location: [facility];  Service: General;  Laterality: N/A;    FAMILY HISTORY Family History  Problem Relation Age of Onset  . Diabetes Father   . Heart attack Father    She notes that her father died from a possible MI in his early 16's Her mother is still alive and will be 41 March 2019!  The patient has 2 brothers and 2 sisters. Her brother had prostate cancer possibly agent orange related. She has a cousin who was recently diagnosed with stomach cancer. Her maternal grandmother died from colon cancer. She has a maternal uncle with bone cancer. She denies family hx of breast or ovarian cancer.    GYNECOLOGIC HISTORY:  Menarche: 73 years old Age at first live birth: 73 years old [place] P3 LMP: at age 68 Contraceptive: OCP HRT   Hysterectomy? 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 She also worked at [employer]. She lives at home with her husband, Gwyndolyn Saxon who is a retired Building control surveyor, her husband works part time driving an adult day care bus. Her daughter, [name] is a Multimedia programmer. Her daughter [name] lives in [place], [place] and works as a Therapist, sports for the [place]. Her son Fatmata Legere, lives in [place], and works as a Administrator.  The patient has 3 grandchildren and no great-grandchildren. She is of baptist faith.     ADVANCED DIRECTIVES:    HEALTH MAINTENANCE: Social History   Tobacco Use  . Smoking status: Never Smoker  . Smokeless tobacco: Never Used  Substance Use Topics  . Alcohol use: Never    Frequency: Never  . Drug use: Never     Colonoscopy: UTD; last year (2018)  PAP:  Bone density:    Allergies  Allergen Reactions  . Prednisone Swelling and Other (See Comments)    " I think it was this that made my tongue swell "--Patient has received steroid injections without any issues--[facility] 04/12/2019    Current Outpatient Medications  Medication Sig Dispense Refill  . desoximetasone (TOPICORT) 0.25 % cream Apply 1 application topically 2 (two) times daily. 200 g 1  . hydrOXYzine (ATARAX/VISTARIL) 25 MG tablet Take 25 mg by mouth 3 (three) times daily.     Marland Kitchen levothyroxine (SYNTHROID) 50 MCG tablet TAKE 1 TABLET BY MOUTH DAILY BEFORE BREAKFAST 30 tablet 1  . lidocaine-prilocaine (EMLA)  cream APPLY  CREAM TOPICALLY AS NEEDED 30 g 0  . naproxen sodium (ALEVE) 220 MG tablet Take 220 mg by mouth daily as needed (for pain).    . predniSONE (DELTASONE) 20 MG tablet Continue until instructed to taper doses by MD as: 60 mg x 3 days then 40 mg x 3 days then 20 mg for 3 days then 10 mg x 3 days then 10 mg every other day x 6 days and then stop 30 tablet 0   No current facility-administered medications for this visit.    Facility-Administered Medications Ordered in Other Visits  Medication Dose Route Frequency Provider Last Rate Last Dose  . heparin lock flush 100 unit/mL  250 Units Intracatheter Once Janila Arrazola, [name], MD        OBJECTIVE: Middle-aged African-American woman in no acute distress  Vitals:   05/24/19 1018  BP: 132/81  Pulse: 69  Resp: 18  Temp: 98.2 F (36.8  C)  SpO2: 98%     Body mass index is 32.45 kg/m.   Wt Readings from Last 3 Encounters:  05/24/19 177 lb 6.4 oz (80.5 kg)  05/03/19 176 lb 3 oz (79.9 kg)  04/26/19 174 lb 4.8 oz (79.1 kg)  ECOG FS:1 - Symptomatic but completely ambulatory  Sclerae unicteric, EOMs intact Wearing a mask No cervical or supraclavicular adenopathy Lungs no rales or rhonchi Heart regular rate and rhythm Abd soft, nontender, positive bowel sounds MSK no focal spinal tenderness, no upper extremity lymphedema Neuro: nonfocal, well oriented, appropriate affect Breasts: The right breast has undergone lumpectomy followed by radiation with no evidence of disease recurrence the left breast is unremarkable.  Both axillae are benign. Skin: The rash is no longer palpable and barely visible.   LAB RESULTS:  CMP     Component Value Date/Time   NA 139 05/03/2019 0855   K 3.9 05/03/2019 0855   CL 104 05/03/2019 0855   CO2 27 05/03/2019 0855   GLUCOSE 75 05/03/2019 0855   BUN 17 05/03/2019 0855   CREATININE 0.86 05/03/2019 0855   CREATININE 0.89 02/08/2019 0805   CALCIUM 9.0 05/03/2019 0855   PROT 6.8 05/03/2019 0855   ALBUMIN 3.6 05/03/2019 0855   AST 16 05/03/2019 0855   AST 17 02/08/2019 0805   ALT 23 05/03/2019 0855   ALT 13 02/08/2019 0805   ALKPHOS 56 05/03/2019 0855   BILITOT 0.5 05/03/2019 0855   BILITOT 0.3 02/08/2019 0805   GFRNONAA >60 05/03/2019 0855   GFRNONAA >60 02/08/2019 0805   GFRAA >60 05/03/2019 0855   GFRAA >60 02/08/2019 0805    No results found for: TOTALPROTELP, ALBUMINELP, A1GS, A2GS, BETS, BETA2SER, GAMS, MSPIKE, SPEI  No results found for: KPAFRELGTCHN, LAMBDASER, KAPLAMBRATIO  Lab Results  Component Value Date   WBC 4.7 05/24/2019   NEUTROABS 3.1 05/24/2019   HGB 12.3 05/24/2019   HCT 38.0 05/24/2019   MCV 101.6 (H) 05/24/2019   PLT 204 05/24/2019    _0 @  No results found for: LABCA2  No components found for: VFIEPP295  No results for input(s):  INR in the last 168 hours.  No results found for: LABCA2  No results found for: JOA416  No results found for: SAY301  No results found for: SWF093  No results found for: CA2729  No components found for: HGQUANT  No results found for: CEA1 / No results found for: CEA1   No results found for: AFPTUMOR  No results found for: [place]  No results found for: PSA1  Appointment on 05/24/2019  Component Date Value Ref Range Status  . WBC 05/24/2019 4.7  4.0 - 10.5 K/uL Final  . RBC 05/24/2019 3.74* 3.87 - 5.11 MIL/uL Final  . Hemoglobin 05/24/2019 12.3  12.0 - 15.0 g/dL Final  . HCT 05/24/2019 38.0  36.0 - 46.0 % Final  . MCV 05/24/2019 101.6* 80.0 - 100.0 fL Final  . MCH 05/24/2019 32.9  26.0 - 34.0 pg Final  . MCHC 05/24/2019 32.4  30.0 - 36.0 g/dL Final  . RDW 05/24/2019 14.6  11.5 - 15.5 % Final  . Platelets 05/24/2019 204  150 - 400 K/uL Final  . nRBC 05/24/2019 0.0  0.0 - 0.2 % Final  . Neutrophils Relative % 05/24/2019 66  % Final  . Neutro Abs 05/24/2019 3.1  1.7 - 7.7 K/uL Final  . Lymphocytes Relative 05/24/2019 18  % Final  . Lymphs Abs 05/24/2019 0.8  0.7 - 4.0 K/uL Final  . Monocytes Relative 05/24/2019 14  % Final  . Monocytes Absolute 05/24/2019 0.7  0.1 - 1.0 K/uL Final  . Eosinophils Relative 05/24/2019 1  % Final  . Eosinophils Absolute 05/24/2019 0.1  0.0 - 0.5 K/uL Final  . Basophils Relative 05/24/2019 0  % Final  . Basophils Absolute 05/24/2019 0.0  0.0 - 0.1 K/uL Final  . Immature Granulocytes 05/24/2019 1  % Final  . Abs Immature Granulocytes 05/24/2019 0.04  0.00 - 0.07 K/uL Final   Performed at [name], [place] [address], [place], [place] [address]    (this displays the last labs from the last 3 days)  No results found for: TOTALPROTELP, ALBUMINELP, A1GS, A2GS, BETS, BETA2SER, GAMS, MSPIKE, SPEI (this displays SPEP labs)  No results found for: KPAFRELGTCHN, LAMBDASER, KAPLAMBRATIO (kappa/lambda light chains)  No  results found for: HGBA, HGBA2QUANT, HGBFQUANT, HGBSQUAN (Hemoglobinopathy evaluation)   No results found for: LDH  No results found for: IRON, TIBC, IRONPCTSAT (Iron and TIBC)  No results found for: FERRITIN  Urinalysis No results found for: COLORURINE, APPEARANCEUR, LABSPEC, PHURINE, GLUCOSEU, HGBUR, BILIRUBINUR, KETONESUR, PROTEINUR, UROBILINOGEN, NITRITE, LEUKOCYTESUR   STUDIES: No results found.  ELIGIBLE FOR AVAILABLE RESEARCH PROTOCOL:  SWOG [phone] (observation arm)  ASSESSMENT: 73 y.o. [name], [place] woman status post right breast upper outer quadrant biopsy 11/25/2017 for a clinical T2 N2, stage IIIc invasive ductal carcinoma, grade 3, triple negative, with an MIB-1 of 80%.  (1) staging studies:  (a) chest CT 12/10/2017 scan showed no visceral metastatic disease  (b) bone scan 12/10/2017 showed uptake at T11, with sclerosis.  (c) CA-27-29 on 12/05/2017 was 19.0  (d) spinal MRI in [place] reportedly showed only arthrtis at T11  (2) neoadjuvant chemotherapy consisting of carboplatin/ paclitaxel x 12 (received 9) started 01/04/2018, discontinued 03/15/2018, followed by cyclophosphamide and doxorubicin given every 3 weeks x 4 starting 04/05/2018, completed 06/14/2018  (a) carboplatin/paclitaxel changed to carboplatin/gemcitabine after 8 cycles due to neuroapthy  (b) only able to tolerate 1 cycle of Gemcitabine/Carboplatin due to rash  (a) echocardiogram on 04/02/18 demonstrates a LVEF of 60-65%  (3) status post right lumpectomy and sentinel lymph node sampling on 06/28/2018 showing a residual 2.2 cm, grade 3 invasive ductal carcinoma, with negative margins and 0 of 3 sentinel lymph nodes removed involved (ypT2 ypN0)  (4) adjuvant radiation through the [facility] in [place] mid November through mid December 2019  (5) participating in the pembrolizumab study S1418, randomized to observation  (6) pembrolizumab started (not as part of study) 11/15/2018, repeated every 21 days, complicated  by rash, last dose 05/24/2019  PLAN:  Unique will receive her pembrolizumab today.  She felt that since she had, all the way here to receive it we should do it although she is aware that this may bring her rash because we will again give her some steroids and she still has some of the steroid cream left that she can balance.  However I am not planning any further pembrolizumab treatments.  We will initiate observation from this point.  She is going to return to see me in November.  She will have a CT of the chest and lab work on the same day.  She knows to call for any other issue that may develop before that visit.   [name]. [name], MD  05/24/19 10:36 AM Medical Oncology and Hematology [facility] [address] [place], [place] [address] Tel. [phone]    Fax. [phone]   I, Wilburn Mylar, am acting as scribe for Dr. [name]. [name].  I, Lurline Del MD, have reviewed the above documentation for accuracy and completeness, and I agree with the above.

## 2019-05-24 ENCOUNTER — Inpatient Hospital Stay: Payer: Medicare Other | Attending: Nurse Practitioner

## 2019-05-24 ENCOUNTER — Encounter (INDEPENDENT_AMBULATORY_CARE_PROVIDER_SITE_OTHER): Payer: Self-pay

## 2019-05-24 ENCOUNTER — Inpatient Hospital Stay: Payer: Medicare Other

## 2019-05-24 ENCOUNTER — Other Ambulatory Visit: Payer: Self-pay

## 2019-05-24 ENCOUNTER — Inpatient Hospital Stay (HOSPITAL_BASED_OUTPATIENT_CLINIC_OR_DEPARTMENT_OTHER): Payer: Medicare Other | Admitting: Oncology

## 2019-05-24 VITALS — BP 132/81 | HR 69 | Temp 98.2°F | Resp 18 | Ht 62.0 in | Wt 177.4 lb

## 2019-05-24 DIAGNOSIS — Z171 Estrogen receptor negative status [ER-]: Secondary | ICD-10-CM

## 2019-05-24 DIAGNOSIS — C50411 Malignant neoplasm of upper-outer quadrant of right female breast: Secondary | ICD-10-CM | POA: Insufficient documentation

## 2019-05-24 DIAGNOSIS — Z79899 Other long term (current) drug therapy: Secondary | ICD-10-CM | POA: Insufficient documentation

## 2019-05-24 DIAGNOSIS — Z5112 Encounter for antineoplastic immunotherapy: Secondary | ICD-10-CM | POA: Insufficient documentation

## 2019-05-24 DIAGNOSIS — E039 Hypothyroidism, unspecified: Secondary | ICD-10-CM

## 2019-05-24 LAB — CBC WITH DIFFERENTIAL/PLATELET
Abs Immature Granulocytes: 0.04 10*3/uL (ref 0.00–0.07)
Basophils Absolute: 0 10*3/uL (ref 0.0–0.1)
Basophils Relative: 0 %
Eosinophils Absolute: 0.1 10*3/uL (ref 0.0–0.5)
Eosinophils Relative: 1 %
HCT: 38 % (ref 36.0–46.0)
Hemoglobin: 12.3 g/dL (ref 12.0–15.0)
Immature Granulocytes: 1 %
Lymphocytes Relative: 18 %
Lymphs Abs: 0.8 10*3/uL (ref 0.7–4.0)
MCH: 32.9 pg (ref 26.0–34.0)
MCHC: 32.4 g/dL (ref 30.0–36.0)
MCV: 101.6 fL — ABNORMAL HIGH (ref 80.0–100.0)
Monocytes Absolute: 0.7 10*3/uL (ref 0.1–1.0)
Monocytes Relative: 14 %
Neutro Abs: 3.1 10*3/uL (ref 1.7–7.7)
Neutrophils Relative %: 66 %
Platelets: 204 10*3/uL (ref 150–400)
RBC: 3.74 MIL/uL — ABNORMAL LOW (ref 3.87–5.11)
RDW: 14.6 % (ref 11.5–15.5)
WBC: 4.7 10*3/uL (ref 4.0–10.5)
nRBC: 0 % (ref 0.0–0.2)

## 2019-05-24 LAB — COMPREHENSIVE METABOLIC PANEL
ALT: 20 U/L (ref 0–44)
AST: 16 U/L (ref 15–41)
Albumin: 3.4 g/dL — ABNORMAL LOW (ref 3.5–5.0)
Alkaline Phosphatase: 60 U/L (ref 38–126)
Anion gap: 9 (ref 5–15)
BUN: 12 mg/dL (ref 8–23)
CO2: 28 mmol/L (ref 22–32)
Calcium: 8.9 mg/dL (ref 8.9–10.3)
Chloride: 104 mmol/L (ref 98–111)
Creatinine, Ser: 0.78 mg/dL (ref 0.44–1.00)
GFR calc Af Amer: >60 mL/min (ref >60)
GFR calc non Af Amer: >60 mL/min (ref >60)
Glucose, Bld: 59 mg/dL — ABNORMAL LOW (ref 70–99)
Potassium: 3.9 mmol/L (ref 3.5–5.1)
Sodium: 141 mmol/L (ref 135–145)
Total Bilirubin: 0.3 mg/dL (ref 0.3–1.2)
Total Protein: 6.5 g/dL (ref 6.5–8.1)

## 2019-05-24 LAB — TSH: TSH: 8.804 u[IU]/mL — ABNORMAL HIGH (ref 0.308–3.960)

## 2019-05-24 MED ORDER — SODIUM CHLORIDE 0.9% FLUSH
10.0000 mL | INTRAVENOUS | Status: DC | PRN
  Administered 2019-05-24: 13:00:00 10 mL
  Filled 2019-05-24: qty 10

## 2019-05-24 MED ORDER — HEPARIN SOD (PORK) LOCK FLUSH 100 UNIT/ML IV SOLN
500.0000 [IU] | Freq: Once | INTRAVENOUS | Status: AC | PRN
  Administered 2019-05-24: 13:00:00 500 [IU]
  Filled 2019-05-24: qty 5

## 2019-05-24 MED ORDER — SODIUM CHLORIDE 0.9 % IV SOLN
200.0000 mg | Freq: Once | INTRAVENOUS | Status: AC
  Administered 2019-05-24: 12:00:00 200 mg via INTRAVENOUS
  Filled 2019-05-24: qty 8

## 2019-05-24 MED ORDER — SODIUM CHLORIDE 0.9 % IV SOLN
Freq: Once | INTRAVENOUS | Status: AC
  Administered 2019-05-24: 11:00:00 via INTRAVENOUS
  Filled 2019-05-24: qty 250

## 2019-05-24 NOTE — Patient Instructions (Signed)
[facility] Discharge Instructions for Patients Receiving Chemotherapy  Today you received the following chemotherapy agents Pembrolizumab (KEYTRUDA).  To help prevent nausea and vomiting after your treatment, we encourage you to take your nausea medication as prescribed.   If you develop nausea and vomiting that is not controlled by your nausea medication, call the clinic.   BELOW ARE SYMPTOMS THAT SHOULD BE REPORTED IMMEDIATELY:  *FEVER GREATER THAN 100.5 F  *CHILLS WITH OR WITHOUT FEVER  NAUSEA AND VOMITING THAT IS NOT CONTROLLED WITH YOUR NAUSEA MEDICATION  *UNUSUAL SHORTNESS OF BREATH  *UNUSUAL BRUISING OR BLEEDING  TENDERNESS IN MOUTH AND THROAT WITH OR WITHOUT PRESENCE OF ULCERS  *URINARY PROBLEMS  *BOWEL PROBLEMS  UNUSUAL RASH Items with * indicate a potential emergency and should be followed up as soon as possible.  Feel free to call the clinic should you have any questions or concerns. The clinic phone number is [phone].  Please show the CHEMO ALERT CARD at check-in to the Emergency Department and triage nurse.  Coronavirus (COVID-19) Are you at risk?  Are you at risk for the Coronavirus (COVID-19)?  To be considered HIGH RISK for Coronavirus (COVID-19), you have to meet the following criteria:  . Traveled to [place], [place], [place], [place] or [place]; or in the [place] to [place], [place], [place], or [place]; and have fever, cough, and shortness of breath within the last 2 weeks of travel OR . Been in close contact with a person diagnosed with COVID-19 within the last 2 weeks and have fever, cough, and shortness of breath . IF YOU DO NOT MEET THESE CRITERIA, YOU ARE CONSIDERED LOW RISK FOR COVID-19.  What to do if you are HIGH RISK for COVID-19?  . If you are having a medical emergency, call 911. . Seek medical care right away. Before you go to a doctor's office, urgent care or emergency department, call ahead and tell  them about your recent travel, contact with someone diagnosed with COVID-19, and your symptoms. You should receive instructions from your physician's office regarding next steps of care.  . When you arrive at healthcare provider, tell the healthcare staff immediately you have returned from visiting [place], [place], [place], [place] or [place]; or traveled in the [place] to [place], [place], [place], or [place]; in the last two weeks or you have been in close contact with a person diagnosed with COVID-19 in the last 2 weeks.   . Tell the health care staff about your symptoms: fever, cough and shortness of breath. . After you have been seen by a medical provider, you will be either: o Tested for (COVID-19) and discharged home on quarantine except to seek medical care if symptoms worsen, and asked to  - Stay home and avoid contact with others until you get your results (4-5 days)  - Avoid travel on public transportation if possible (such as bus, train, or airplane) or o Sent to the Emergency Department by EMS for evaluation, COVID-19 testing, and possible admission depending on your condition and test results.  What to do if you are LOW RISK for COVID-19?  Reduce your risk of any infection by using the same precautions used for avoiding the common cold or flu:  . Wash your hands often with soap and warm water for at least 20 seconds.  If soap and water are not readily available, use an alcohol-based hand sanitizer with at least 60% alcohol.  . If coughing or   sneezing, cover your mouth and nose by coughing or sneezing into the elbow areas of your shirt or coat, into a tissue or into your sleeve (not your hands). . Avoid shaking hands with others and consider head nods or verbal greetings only. . Avoid touching your eyes, nose, or mouth with unwashed hands.  . Avoid close contact with people who are sick. . Avoid places or events with large numbers of people in one location, like concerts or  sporting events. . Carefully consider travel plans you have or are making. . If you are planning any travel outside or inside the [place], visit the CDC's Travelers' Health webpage for the latest health notices. . If you have some symptoms but not all symptoms, continue to monitor at home and seek medical attention if your symptoms worsen. . If you are having a medical emergency, call 911.   ADDITIONAL HEALTHCARE OPTIONS FOR PATIENTS  [facility] / e-Visit: [email or web address]         [facility]: [phone]  [facility]: [phone]                   [facility]: [phone]    

## 2019-05-26 ENCOUNTER — Telehealth: Payer: Self-pay | Admitting: Oncology

## 2019-05-26 NOTE — Telephone Encounter (Signed)
I could not reach regarding schedule  °

## 2019-05-28 ENCOUNTER — Other Ambulatory Visit: Payer: Self-pay | Admitting: Oncology

## 2019-05-28 DIAGNOSIS — C50411 Malignant neoplasm of upper-outer quadrant of right female breast: Secondary | ICD-10-CM

## 2019-06-07 DIAGNOSIS — Z1159 Encounter for screening for other viral diseases: Secondary | ICD-10-CM | POA: Diagnosis not present

## 2019-06-23 DIAGNOSIS — Z1159 Encounter for screening for other viral diseases: Secondary | ICD-10-CM | POA: Diagnosis not present

## 2019-06-26 ENCOUNTER — Other Ambulatory Visit: Payer: Self-pay | Admitting: General Surgery

## 2019-06-27 DIAGNOSIS — Z452 Encounter for adjustment and management of vascular access device: Secondary | ICD-10-CM | POA: Diagnosis not present

## 2019-06-27 DIAGNOSIS — C50211 Malignant neoplasm of upper-inner quadrant of right female breast: Secondary | ICD-10-CM | POA: Diagnosis not present

## 2019-07-25 DIAGNOSIS — Z23 Encounter for immunization: Secondary | ICD-10-CM | POA: Diagnosis not present

## 2019-07-31 ENCOUNTER — Other Ambulatory Visit: Payer: Self-pay | Admitting: Oncology

## 2019-07-31 DIAGNOSIS — Z171 Estrogen receptor negative status [ER-]: Secondary | ICD-10-CM

## 2019-07-31 DIAGNOSIS — C50411 Malignant neoplasm of upper-outer quadrant of right female breast: Secondary | ICD-10-CM

## 2019-08-08 NOTE — Progress Notes (Signed)
[place]  Telephone:(336) [phone] Fax:(336) [phone]     ID: [place] DOB: 09-19-1946  MR#: [id number]  VOZ#:[id number]  Patient Care Team: Renee Pain, FNP as PCP - General (Nurse Practitioner) Sierria Bruney, [name], MD as Consulting Physician (Oncology) [name], MD as Consulting Physician (General Surgery) [name], MD as Radiation Oncologist (Radiation Oncology) OTHER MD:   CHIEF COMPLAINT: Triple negative breast cancer  CURRENT TREATMENT: Observation   INTERVAL HISTORY: Veronica Price returns today for follow-up of her triple negative breast cancer. She is on observation.  We obtained a CT scan of the chest today which shows no evidence of residual or recurrent disease.  We are following her TSH levels with a repeat due today. Lab Results  Component Value Date   TSH 11.041 (H) 08/09/2019   TSH 8.804 (H) 05/24/2019   TSH 4.520 (H) 05/03/2019   TSH 9.673 (H) 03/02/2019   TSH 14.475 (H) 02/08/2019   She underwent chest CT earlier today, which showed no evidence of breast cancer   REVIEW OF SYSTEMS: Veronica Price is doing "her normal thing", mostly doing housework.  She does not exercise otherwise.  She is not working in her garden since she had a rash last time she did.  At home is just she and her husband and her son who is a Administrator and comes in at times.  They are all being quite careful regarding the pandemic.  Detailed review of systems today was otherwise stable   HISTORY OF CURRENT ILLNESS: [place]onica Price initially palpated a mass to her right breast in February 2019.   She brought her to her primary care physician's attention and was set up for bilateral diagnostic mammography with tomography and right breast ultrasonography on 11/25/2017 showing: a 3.2 x 3.7 cm lobulated mass in the UOQ of the right breast; there was a 3 cm calcified retroareolar mass in the left breast. On right breast ultrasonography there was a complex  mostly solid 2.4 x 3.1 cm mass at 10 o'clock and multiple enlarged right axillary lymph nodes, the largest measuring 1.6 x 2.4.  Accordingly on 11/27/2017 the patient proceeded to biopsy of the right breast area in question, as well as an attempted axillary node biopsy. The pathology from this procedure showed ([id number]):  high grade infiltrating ductal carcinoma with no lymphovascular invasion.  The attempted axillary node biopsy showed fat, no nodal tissue.  Prognostic indicators were: estrogen and progesterone receptor negative,. Proliferation marker Ki67 at >80%. HER2 negative by immunohistochemistry at 0%  CT scans of the chest abdomen and pelvis completed on 12/10/2017 showed A complex solid and cystic 3.5 cm right breast mass with several bilateral axillary lymph nodes, the largest in the right axilla measured 12 mm. There was no evidence of visceral metastatic disease.   She had a bone scan on 12/10/2017 that showed two areas of increased uptake overlying the lateral aspects of the T11 vertebrae. Review of the CT scan of the chest revealed sclerosis of the T11 pedicles concerning for bone metastases.  However subsequent spinal MRI (I do not have that report) read this out as arthritis, not a metastatic deposit  The patient had a echocardiogram completed on 3/72019 with results showing: left ventricular ejection fraction of 55-60% with mild concentric hypertrophy. There was mild to moderate mitral and mild tricuspid regurgitation with mild pulmonary hypertension. The aortic valve was mildly thickened with trace aortic regurgitation.   The patient's subsequent history is as detailed below.  PAST MEDICAL HISTORY: Past Medical History:  Diagnosis Date   Arthritis 2003   Breast cancer ([place]) 11/25/2017   Right breast   Cataract 2016   "early" per patient-no surgery yet    GERD (gastroesophageal reflux disease) 2003   per patient report   Hypothyroidism 10/20/2018    PAST SURGICAL  HISTORY: Past Surgical History:  Procedure Laterality Date   ABDOMINAL HYSTERECTOMY  2002   Total hysterectomy in 2002   BREAST LUMPECTOMY WITH RADIOACTIVE SEED AND SENTINEL LYMPH NODE BIOPSY Right 06/28/2018   Procedure: BREAST LUMPECTOMY WITH RADIOACTIVE SEED AND SENTINEL LYMPH NODE BIOPSY;  Surgeon: [name], MD;  Location: [facility];  Service: General;  Laterality: Right;   PORTACATH PLACEMENT N/A 12/29/2017   Procedure: INSERTION PORT-A-CATH LEFT SUBCLAVIAN;  Surgeon: [name], MD;  Location: [facility];  Service: General;  Laterality: N/A;    FAMILY HISTORY Family History  Problem Relation Age of Onset   Diabetes Father    Heart attack Father    She notes that her father died from a possible MI in his early 45's Her mother is still alive and will be 101 March 2019!  The patient has 2 brothers and 2 sisters. Her brother had prostate cancer possibly agent orange related. She has a cousin who was recently diagnosed with stomach cancer. Her maternal grandmother died from colon cancer. She has a maternal uncle with bone cancer. She denies family hx of breast or ovarian cancer.    GYNECOLOGIC HISTORY:  Menarche: 73 years old Age at first live birth: 73 years old [place] P3 LMP: at age 22 Contraceptive: OCP HRT   Hysterectomy? 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 She also worked at [employer]. She lives at home with her husband, Gwyndolyn Saxon who is a retired Building control surveyor, her husband works part time driving an adult day care bus. Her daughter, [name] is a Multimedia programmer. Her daughter [name] lives in [place], [place] and works as a Therapist, sports for the [place]. Her son Denelda Akerley, lives in [place], and works as a Administrator.  The patient has 3 grandchildren and no great-grandchildren. She is of baptist faith.     ADVANCED DIRECTIVES:    HEALTH MAINTENANCE: Social  History   Tobacco Use   Smoking status: Never Smoker   Smokeless tobacco: Never Used  Substance Use Topics   Alcohol use: Never    Frequency: Never   Drug use: Never     Colonoscopy: UTD; last year (2018)  PAP:  Bone density:    Allergies  Allergen Reactions   Prednisone Swelling and Other (See Comments)    " I think it was this that made my tongue swell "--Patient has received steroid injections without any issues--[facility] 04/12/2019    Current Outpatient Medications  Medication Sig Dispense Refill   levothyroxine (SYNTHROID) 50 MCG tablet TAKE 1 TABLET BY MOUTH DAILY BEFORE BREAKFAST 30 tablet 1   naproxen sodium (ALEVE) 220 MG tablet Take 220 mg by mouth daily as needed (for pain).     No current facility-administered medications for this visit.    Facility-Administered Medications Ordered in Other Visits  Medication Dose Route Frequency Provider Last Rate Last Dose   heparin lock flush 100 unit/mL  250 Units Intracatheter Once Ether Goebel, [name], MD       sodium chloride (PF) 0.9 % injection  OBJECTIVE: Middle-aged African-American Price who appears stated age  13:   08/09/19 1146  BP: (!) 160/86  Pulse: 66  Resp: 18  Temp: 98.9 F (37.2 C)  SpO2: 100%     Body mass index is 32.61 kg/m.   Wt Readings from Last 3 Encounters:  08/09/19 178 lb 4.8 oz (80.9 kg)  05/24/19 177 lb 6.4 oz (80.5 kg)  05/03/19 176 lb 3 oz (79.9 kg)  ECOG FS:1 - Symptomatic but completely ambulatory  Sclerae unicteric, EOMs intact Wearing a mask No cervical or supraclavicular adenopathy Lungs no rales or rhonchi Heart regular rate and rhythm Abd soft, nontender, positive bowel sounds MSK no focal spinal tenderness, no upper extremity lymphedema Neuro: nonfocal, well oriented, appropriate affect Breasts: The right breast is status post lumpectomy and radiation.  There is some distortion to the contour and there is still hyperpigmentation but there is no evidence of  disease recurrence.  The left breast is benign.  Both axillae are benign.   LAB RESULTS:  CMP     Component Value Date/Time   NA 139 08/09/2019 0852   K 4.1 08/09/2019 0852   CL 103 08/09/2019 0852   CO2 27 08/09/2019 0852   GLUCOSE 116 (H) 08/09/2019 0852   BUN 16 08/09/2019 0852   CREATININE 1.00 08/09/2019 0852   CREATININE 0.89 02/08/2019 0805   CALCIUM 9.3 08/09/2019 0852   PROT 7.3 08/09/2019 0852   ALBUMIN 3.9 08/09/2019 0852   AST 17 08/09/2019 0852   AST 17 02/08/2019 0805   ALT 13 08/09/2019 0852   ALT 13 02/08/2019 0805   ALKPHOS 70 08/09/2019 0852   BILITOT 0.4 08/09/2019 0852   BILITOT 0.3 02/08/2019 0805   GFRNONAA 56 (L) 08/09/2019 0852   GFRNONAA >60 02/08/2019 0805   GFRAA >60 08/09/2019 0852   GFRAA >60 02/08/2019 0805    No results found for: TOTALPROTELP, ALBUMINELP, A1GS, A2GS, BETS, BETA2SER, GAMS, MSPIKE, SPEI  No results found for: KPAFRELGTCHN, LAMBDASER, KAPLAMBRATIO  Lab Results  Component Value Date   WBC 4.2 08/09/2019   NEUTROABS 2.7 08/09/2019   HGB 13.2 08/09/2019   HCT 41.2 08/09/2019   MCV 102.5 (H) 08/09/2019   PLT 222 08/09/2019    @LASTCHEMISTRY @  No results found for: LABCA2  No components found for: BWLSLH734  No results for input(s): INR in the last 168 hours.  No results found for: LABCA2  No results found for: KAJ681  No results found for: LXB262  No results found for: MBT597  No results found for: CA2729  No components found for: HGQUANT  No results found for: CEA1 / No results found for: CEA1   No results found for: AFPTUMOR  No results found for: CHROMOGRNA  No results found for: PSA1  Appointment on 08/09/2019  Component Date Value Ref Range Status   TSH 08/09/2019 11.041* 0.308 - 3.960 uIU/mL Final   Performed at [facility], [address] [place], [place] [address]   Sodium 08/09/2019 139  135 - 145 mmol/L Final   Potassium 08/09/2019 4.1  3.5 - 5.1 mmol/L  Final   Chloride 08/09/2019 103  98 - 111 mmol/L Final   CO2 08/09/2019 27  22 - 32 mmol/L Final   Glucose, Bld 08/09/2019 116* 70 - 99 mg/dL Final   BUN 08/09/2019 16  8 - 23 mg/dL Final   Creatinine, Ser 08/09/2019 1.00  0.44 - 1.00 mg/dL Final   Calcium 08/09/2019 9.3  8.9 - 10.3 mg/dL Final  Total Protein 08/09/2019 7.3  6.5 - 8.1 g/dL Final   Albumin 08/09/2019 3.9  3.5 - 5.0 g/dL Final   AST 08/09/2019 17  15 - 41 U/L Final   ALT 08/09/2019 13  0 - 44 U/L Final   Alkaline Phosphatase 08/09/2019 70  38 - 126 U/L Final   Total Bilirubin 08/09/2019 0.4  0.3 - 1.2 mg/dL Final   GFR calc non Af Amer 08/09/2019 56* >60 mL/min Final   GFR calc Af Amer 08/09/2019 >60  >60 mL/min Final   Anion gap 08/09/2019 9  5 - 15 Final   Performed at [facility], [place] [address], [place], [place] [address]   WBC 08/09/2019 4.2  4.0 - 10.5 K/uL Final   RBC 08/09/2019 4.02  3.87 - 5.11 MIL/uL Final   Hemoglobin 08/09/2019 13.2  12.0 - 15.0 g/dL Final   HCT 08/09/2019 41.2  36.0 - 46.0 % Final   MCV 08/09/2019 102.5* 80.0 - 100.0 fL Final   MCH 08/09/2019 32.8  26.0 - 34.0 pg Final   MCHC 08/09/2019 32.0  30.0 - 36.0 g/dL Final   RDW 08/09/2019 13.1  11.5 - 15.5 % Final   Platelets 08/09/2019 222  150 - 400 K/uL Final   nRBC 08/09/2019 0.0  0.0 - 0.2 % Final   Neutrophils Relative % 08/09/2019 62  % Final   Neutro Abs 08/09/2019 2.7  1.7 - 7.7 K/uL Final   Lymphocytes Relative 08/09/2019 21  % Final   Lymphs Abs 08/09/2019 0.9  0.7 - 4.0 K/uL Final   Monocytes Relative 08/09/2019 12  % Final   Monocytes Absolute 08/09/2019 0.5  0.1 - 1.0 K/uL Final   Eosinophils Relative 08/09/2019 3  % Final   Eosinophils Absolute 08/09/2019 0.1  0.0 - 0.5 K/uL Final   Basophils Relative 08/09/2019 1  % Final   Basophils Absolute 08/09/2019 0.0  0.0 - 0.1 K/uL Final   Immature Granulocytes 08/09/2019 1  % Final   Abs Immature Granulocytes 08/09/2019  0.02  0.00 - 0.07 K/uL Final   Performed at [facility], [place] [address], [place], [place] [address]    (this displays the last labs from the last 3 days)  No results found for: TOTALPROTELP, ALBUMINELP, A1GS, A2GS, BETS, BETA2SER, GAMS, MSPIKE, SPEI (this displays SPEP labs)  No results found for: KPAFRELGTCHN, LAMBDASER, KAPLAMBRATIO (kappa/lambda light chains)  No results found for: HGBA, HGBA2QUANT, HGBFQUANT, HGBSQUAN (Hemoglobinopathy evaluation)   No results found for: LDH  No results found for: IRON, TIBC, IRONPCTSAT (Iron and TIBC)  No results found for: FERRITIN  Urinalysis No results found for: COLORURINE, APPEARANCEUR, LABSPEC, PHURINE, GLUCOSEU, HGBUR, BILIRUBINUR, KETONESUR, PROTEINUR, UROBILINOGEN, NITRITE, LEUKOCYTESUR   STUDIES: Ct Chest W Contrast  Result Date: 08/09/2019 CLINICAL DATA:  21m omni300 Right breast cancer w/ possible bone mets to T11. dx'd feb 2019. right lumpectomy, XRT, chemo and radioactive seed complete. Immunotherapy dc'd due to reaction July 2020 EXAM: CT CHEST WITH CONTRAST TECHNIQUE: Multidetector CT imaging of the chest was performed during intravenous contrast administration. CONTRAST:  795mOMNIPAQUE IOHEXOL 300 MG/ML  SOLN COMPARISON:  None. FINDINGS: Cardiovascular: No significant vascular findings. Normal heart size. No pericardial effusion. Mediastinum/Nodes: Postsurgical change in the posterior RIGHT breast and axilla. No enlarged axillary lymph nodes. No supraclavicular adenopathy. No internal mammary adenopathy. 8 mm RIGHT lower paratracheal lymph node is not pathologic by size criteria. Small hilar nodes also. Lungs/Pleura: No suspicious pulmonary nodularity. Mild subpleural reticulation the  both lungs consistent benign chronic finding. No bronchiectasis. Upper Abdomen: Large hiatal hernia. Adrenal glands normal. No upper abdominal adenopathy. There is a potential enhancing septation in the LEFT upper pole 3.2 cm  renal cyst on image 130/2. Musculoskeletal: No aggressive osseous lesion. Particular attention directed to the lower thoracic spine were no metastatic lesions identified. IMPRESSION: 1. No evidence of breast cancer recurrence. 2. No evidence of lymphadenopathy in the thorax. 3. Septated cyst in the upper pole of the LEFT kidney. Recommend MRI with and without contrast renal protocol for further characterization. Electronically Signed   By: Suzy Bouchard M.D.   On: 08/09/2019 12:19    ELIGIBLE FOR AVAILABLE RESEARCH PROTOCOL:  SWOG [id number] (observation arm)  ASSESSMENT: 73 y.o. [place], [place] status post right breast upper outer quadrant biopsy 11/25/2017 for a clinical T2 N2, stage IIIc invasive ductal carcinoma, grade 3, triple negative, with an MIB-1 of 80%.  (1) staging studies:  (a) chest CT 12/10/2017 scan showed no visceral metastatic disease  (b) bone scan 12/10/2017 showed uptake at T11, with sclerosis.  (c) CA-27-29 on 12/05/2017 was 19.0  (d) spinal MRI in [place] reportedly showed only arthrtis at T11  (2) neoadjuvant chemotherapy consisting of carboplatin/ paclitaxel x 12 (received 9) started 01/04/2018, discontinued 03/15/2018, followed by cyclophosphamide and doxorubicin given every 3 weeks x 4 starting 04/05/2018, completed 06/14/2018  (a) carboplatin/paclitaxel changed to carboplatin/gemcitabine after 8 cycles due to neuroapthy  (b) only able to tolerate 1 cycle of Gemcitabine/Carboplatin due to rash  (a) echocardiogram on 04/02/18 demonstrates a LVEF of 60-65%  (3) status post right lumpectomy and sentinel lymph node sampling on 06/28/2018 showing a residual 2.2 cm, grade 3 invasive ductal carcinoma, with negative margins and 0 of 3 sentinel lymph nodes removed involved (ypT2 ypN0)  (4) adjuvant radiation through the [facility] group in [place] mid November through mid December 2019  (5) participating in the pembrolizumab study S1418, randomized to observation  (6) pembrolizumab  started (not as part of study) 11/15/2018, repeated every 21 days, complicated by rash, last dose 05/24/2019  PLAN:  [place] is now a little over a year out from definitive surgery for breast cancer with no evidence of disease recurrence.  This is favorable.  She was just restaged with a CT of the chest which is negative.  It does show a complex cyst in the left kidney and we will obtain an MRI and radiologist suggestion to evaluate that further.  She understands she may eventually need referral to urology.  Generally I am delighted that she is doing so well.  She will see [place] again in 6 months.  She knows to call for any other issue that may develop before that visit.  [name]. Kitai Purdom, MD  08/09/19 12:36 PM Medical Oncology and Hematology [facility] [address] [place], [place] [address] Tel. [phone]    Fax. [phone]   I, Wilburn Mylar, am acting as scribe for Dr. [name]. Densel Kronick.  I, Lurline Del MD, have reviewed the above documentation for accuracy and completeness, and I agree with the above.  Mri abd

## 2019-08-09 ENCOUNTER — Inpatient Hospital Stay: Payer: Medicare Other | Attending: Nurse Practitioner

## 2019-08-09 ENCOUNTER — Ambulatory Visit (HOSPITAL_COMMUNITY)
Admission: RE | Admit: 2019-08-09 | Discharge: 2019-08-09 | Disposition: A | Discharge status: Home / Self Care | Payer: Medicare Other | Source: Ambulatory Visit | Attending: Oncology | Admitting: Oncology

## 2019-08-09 ENCOUNTER — Inpatient Hospital Stay (HOSPITAL_BASED_OUTPATIENT_CLINIC_OR_DEPARTMENT_OTHER): Payer: Medicare Other | Admitting: Oncology

## 2019-08-09 ENCOUNTER — Other Ambulatory Visit: Payer: Self-pay

## 2019-08-09 VITALS — BP 160/86 | HR 66 | Temp 98.9°F | Resp 18 | Wt 178.3 lb

## 2019-08-09 DIAGNOSIS — C50411 Malignant neoplasm of upper-outer quadrant of right female breast: Secondary | ICD-10-CM | POA: Insufficient documentation

## 2019-08-09 DIAGNOSIS — N281 Cyst of kidney, acquired: Secondary | ICD-10-CM | POA: Diagnosis not present

## 2019-08-09 DIAGNOSIS — Z171 Estrogen receptor negative status [ER-]: Secondary | ICD-10-CM

## 2019-08-09 DIAGNOSIS — C50911 Malignant neoplasm of unspecified site of right female breast: Secondary | ICD-10-CM | POA: Diagnosis not present

## 2019-08-09 DIAGNOSIS — E039 Hypothyroidism, unspecified: Secondary | ICD-10-CM

## 2019-08-09 DIAGNOSIS — Z79899 Other long term (current) drug therapy: Secondary | ICD-10-CM | POA: Insufficient documentation

## 2019-08-09 LAB — CBC WITH DIFFERENTIAL/PLATELET
Abs Immature Granulocytes: 0.02 10*3/uL (ref 0.00–0.07)
Basophils Absolute: 0 10*3/uL (ref 0.0–0.1)
Basophils Relative: 1 %
Eosinophils Absolute: 0.1 10*3/uL (ref 0.0–0.5)
Eosinophils Relative: 3 %
HCT: 41.2 % (ref 36.0–46.0)
Hemoglobin: 13.2 g/dL (ref 12.0–15.0)
Immature Granulocytes: 1 %
Lymphocytes Relative: 21 %
Lymphs Abs: 0.9 10*3/uL (ref 0.7–4.0)
MCH: 32.8 pg (ref 26.0–34.0)
MCHC: 32 g/dL (ref 30.0–36.0)
MCV: 102.5 fL — ABNORMAL HIGH (ref 80.0–100.0)
Monocytes Absolute: 0.5 10*3/uL (ref 0.1–1.0)
Monocytes Relative: 12 %
Neutro Abs: 2.7 10*3/uL (ref 1.7–7.7)
Neutrophils Relative %: 62 %
Platelets: 222 10*3/uL (ref 150–400)
RBC: 4.02 MIL/uL (ref 3.87–5.11)
RDW: 13.1 % (ref 11.5–15.5)
WBC: 4.2 10*3/uL (ref 4.0–10.5)
nRBC: 0 % (ref 0.0–0.2)

## 2019-08-09 LAB — COMPREHENSIVE METABOLIC PANEL
ALT: 13 U/L (ref 0–44)
AST: 17 U/L (ref 15–41)
Albumin: 3.9 g/dL (ref 3.5–5.0)
Alkaline Phosphatase: 70 U/L (ref 38–126)
Anion gap: 9 (ref 5–15)
BUN: 16 mg/dL (ref 8–23)
CO2: 27 mmol/L (ref 22–32)
Calcium: 9.3 mg/dL (ref 8.9–10.3)
Chloride: 103 mmol/L (ref 98–111)
Creatinine, Ser: 1 mg/dL (ref 0.44–1.00)
GFR calc Af Amer: >60 mL/min (ref >60)
GFR calc non Af Amer: 56 mL/min — ABNORMAL LOW (ref >60)
Glucose, Bld: 116 mg/dL — ABNORMAL HIGH (ref 70–99)
Potassium: 4.1 mmol/L (ref 3.5–5.1)
Sodium: 139 mmol/L (ref 135–145)
Total Bilirubin: 0.4 mg/dL (ref 0.3–1.2)
Total Protein: 7.3 g/dL (ref 6.5–8.1)

## 2019-08-09 LAB — TSH: TSH: 11.041 u[IU]/mL — ABNORMAL HIGH (ref 0.308–3.960)

## 2019-08-09 MED ORDER — IOHEXOL 300 MG/ML  SOLN
75.0000 mL | Freq: Once | INTRAMUSCULAR | Status: AC | PRN
  Administered 2019-08-09: 75 mL via INTRAVENOUS

## 2019-08-09 MED ORDER — SODIUM CHLORIDE (PF) 0.9 % IJ SOLN
INTRAMUSCULAR | Status: AC
  Filled 2019-08-09: qty 50

## 2019-08-10 ENCOUNTER — Telehealth: Payer: Self-pay | Admitting: Oncology

## 2019-08-10 NOTE — Telephone Encounter (Signed)
I talk with patient regarding schedule  

## 2019-08-23 ENCOUNTER — Ambulatory Visit (HOSPITAL_COMMUNITY)
Admission: RE | Admit: 2019-08-23 | Discharge: 2019-08-23 | Disposition: A | Discharge status: Home / Self Care | Payer: Medicare Other | Source: Ambulatory Visit | Attending: Oncology | Admitting: Oncology

## 2019-08-23 ENCOUNTER — Other Ambulatory Visit: Payer: Self-pay

## 2019-08-23 DIAGNOSIS — N281 Cyst of kidney, acquired: Secondary | ICD-10-CM | POA: Diagnosis not present

## 2019-08-23 MED ORDER — GADOBUTROL 1 MMOL/ML IV SOLN
8.0000 mL | Freq: Once | INTRAVENOUS | Status: AC | PRN
  Administered 2019-08-23: 8 mL via INTRAVENOUS

## 2019-08-24 DIAGNOSIS — C50411 Malignant neoplasm of upper-outer quadrant of right female breast: Secondary | ICD-10-CM | POA: Diagnosis not present

## 2019-08-24 DIAGNOSIS — Z171 Estrogen receptor negative status [ER-]: Secondary | ICD-10-CM | POA: Diagnosis not present

## 2019-09-14 DIAGNOSIS — H5310 Unspecified subjective visual disturbances: Secondary | ICD-10-CM | POA: Diagnosis not present

## 2019-09-20 ENCOUNTER — Other Ambulatory Visit: Payer: Self-pay | Admitting: Oncology

## 2019-09-20 DIAGNOSIS — C50411 Malignant neoplasm of upper-outer quadrant of right female breast: Secondary | ICD-10-CM

## 2019-09-20 DIAGNOSIS — Z171 Estrogen receptor negative status [ER-]: Secondary | ICD-10-CM

## 2019-10-10 ENCOUNTER — Other Ambulatory Visit: Payer: Self-pay | Admitting: *Deleted

## 2019-10-10 DIAGNOSIS — C50411 Malignant neoplasm of upper-outer quadrant of right female breast: Secondary | ICD-10-CM

## 2019-10-10 MED ORDER — LEVOTHYROXINE SODIUM 50 MCG PO TABS: 50.0000 ug | ORAL_TABLET | Freq: Every day | ORAL | 1 refills | Status: DC

## 2019-10-26 NOTE — Telephone Encounter (Signed)
No entry 

## 2019-12-26 IMAGING — MR MR BILATERAL BREAST WITHOUT AND WITH CONTRAST
6 of 10 series · 31 of 48 positions shown · IV contrast (15ml multihance)
Comparison: Previous exam(s).

CLINICAL DATA: Patient with recent diagnosis triple negative right
breast cancer.

LABS:  Creatinine was obtained on site at [HOSPITAL] at [REDACTED] [HOSPITAL].
Results: Creatinine 0.9 mg/dL.
EXAM:
BILATERAL BREAST MRI WITH AND WITHOUT CONTRAST
TECHNIQUE: Multiplanar, multisequence MR images of both breasts were obtained
prior to and following the intravenous administration of 15 ml of
MultiHance.

[Series 3: t2_tirm_tra ipat (a-p) · axial · 3.0mm · 0.74mm/px · z∈[-97,+83]mm · 2 of 61 slices shown]
[im 1/61]
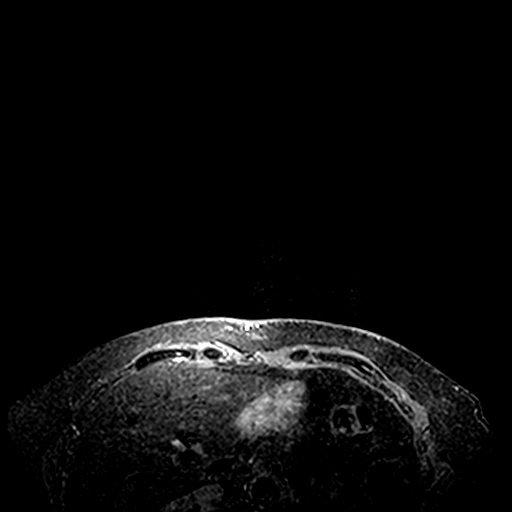
[im 61/61]
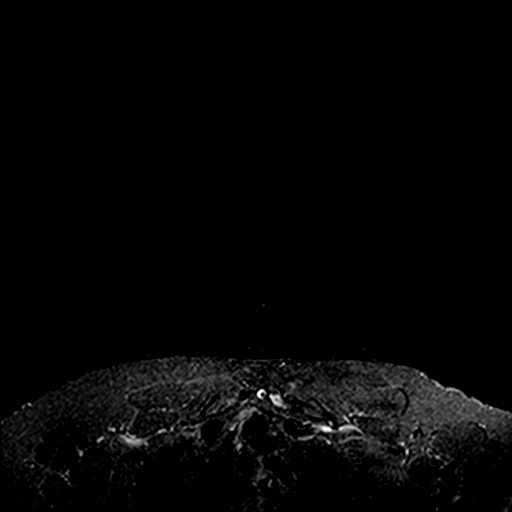

[Series 4: fl3d pre-cm no · axial · non-contrast · 1.2mm · 0.99mm/px · z∈[-112,+98]mm · 7 of 176 slices shown]
[im 1/176]
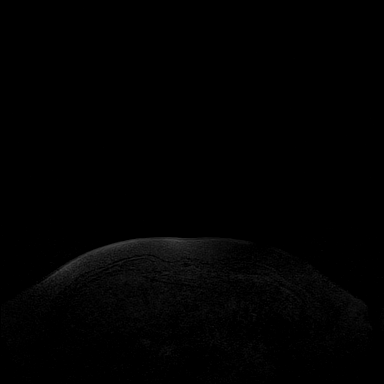
[im 30/176]
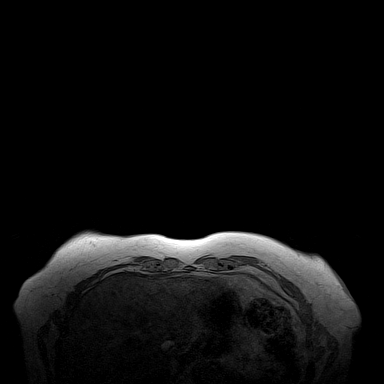
[im 59/176]
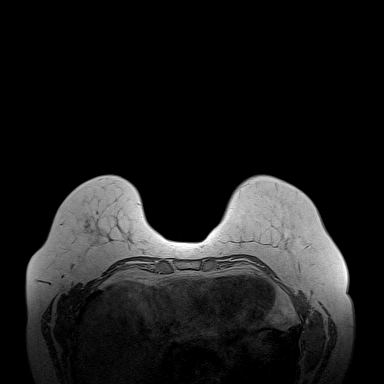
[im 88/176]
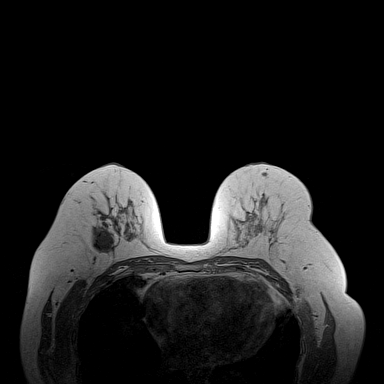
[im 117/176]
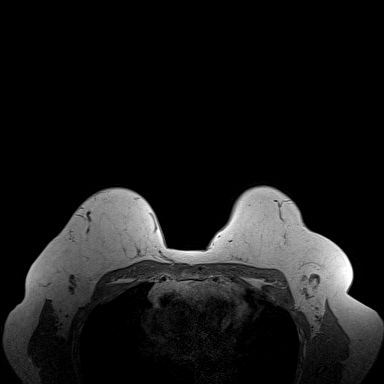
[im 146/176]
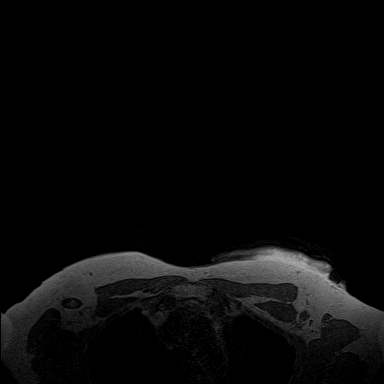
[im 176/176]
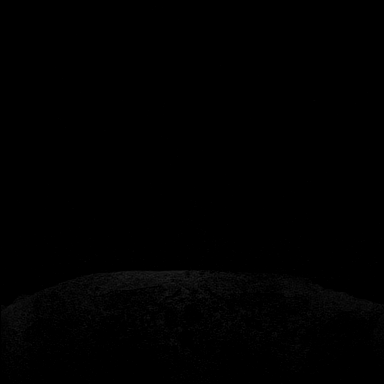

[Series 5: fl3d pre-cm · axial · non-contrast · 1.2mm · 0.99mm/px · z∈[-112,+98]mm · 7 of 176 slices shown]
[im 1/176]
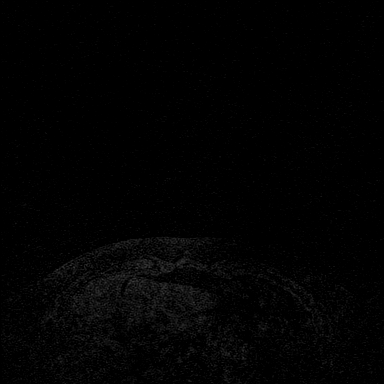
[im 30/176]
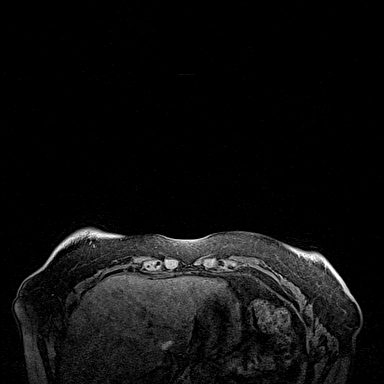
[im 59/176]
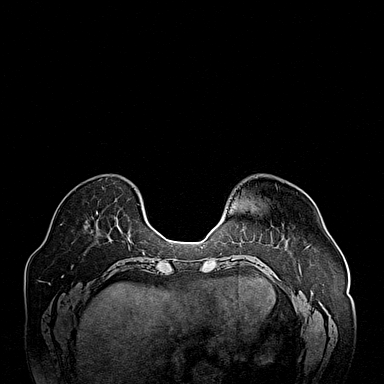
[im 88/176]
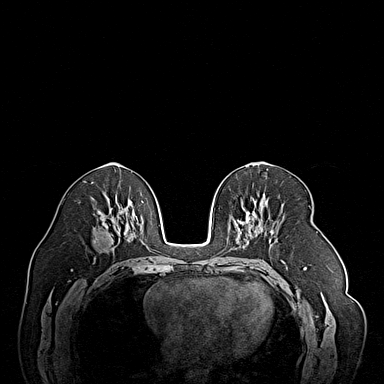
[im 117/176]
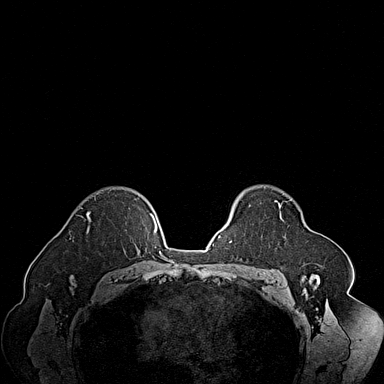
[im 146/176]
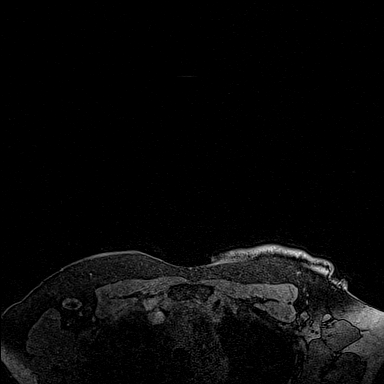
[im 176/176]
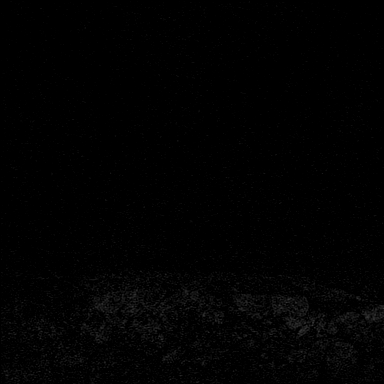

[Series 7: fl3d post immediate · axial · 1.2mm · 0.99mm/px · z∈[-112,+98]mm · 7 of 176 slices shown (1 of 2)]
[im 1/176]
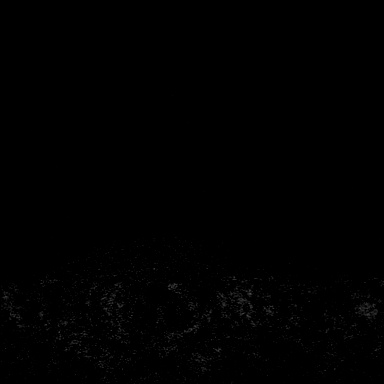
[im 30/176]
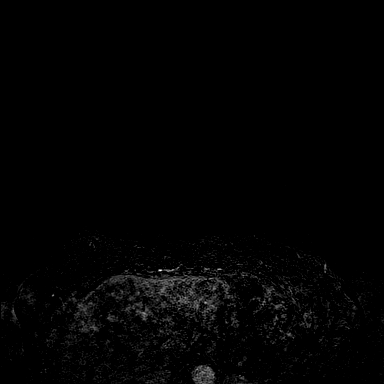
[im 59/176]
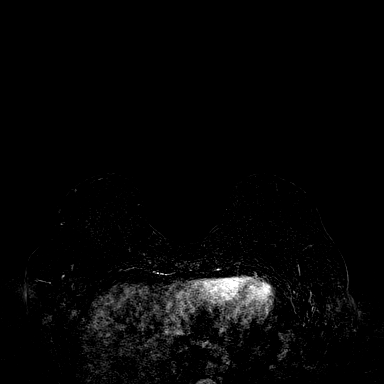
[im 88/176]
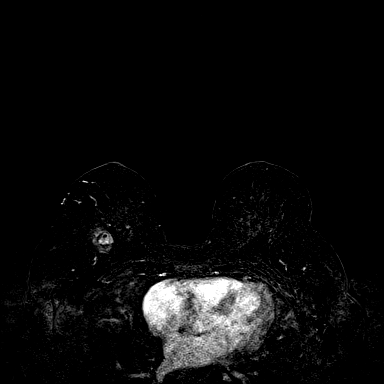
[im 117/176]
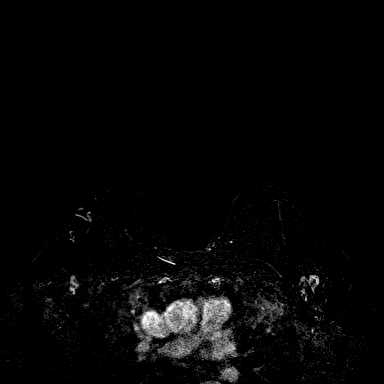
[im 146/176]
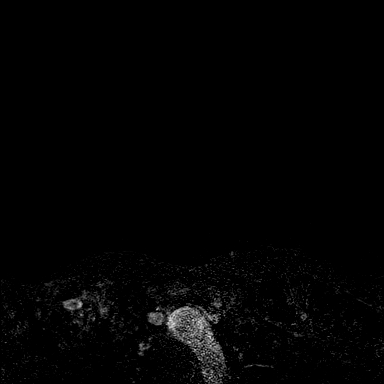
[im 176/176]
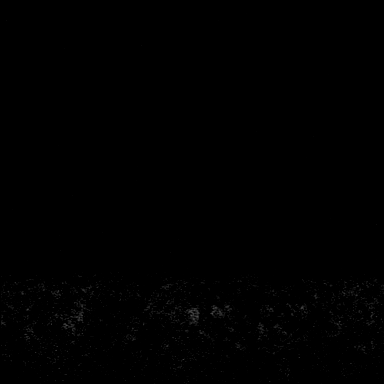

[Series 8: fl3d post immediate · axial · 211.2mm · 0.99mm/px · 1 of 1 slices shown (2 of 2)]
[im 1/1]
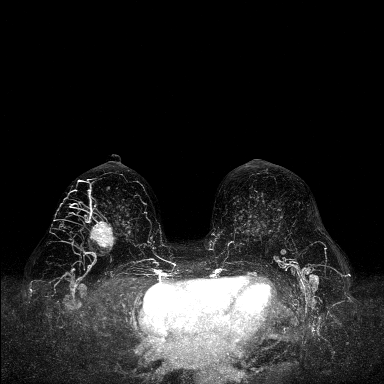

[Series 9: fl3d post 3min · axial · 1.2mm · 0.99mm/px · z∈[-112,+98]mm · 7 of 176 slices shown]
[im 1/176]
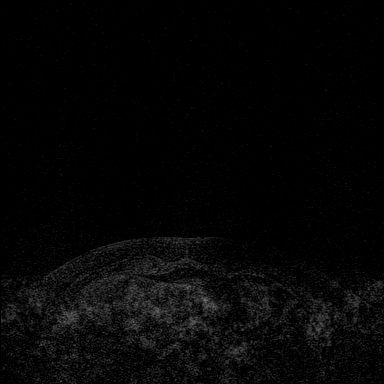
[im 30/176]
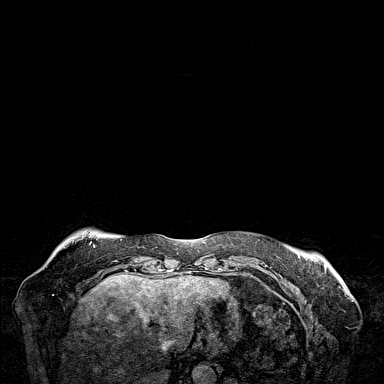
[im 59/176]
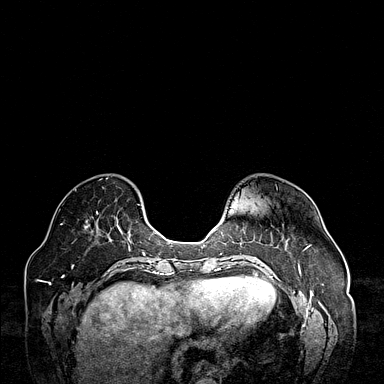
[im 88/176]
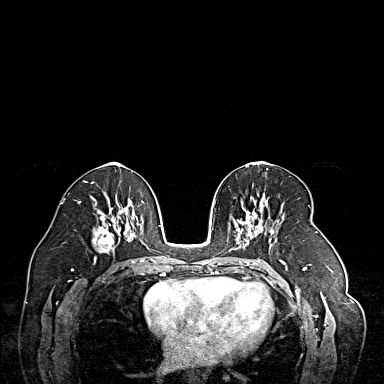
[im 117/176]
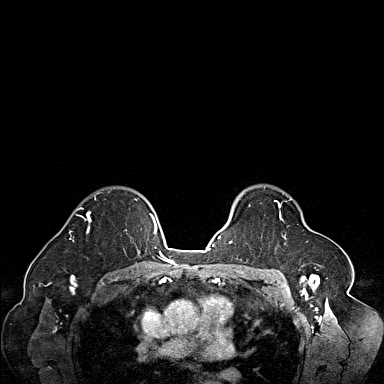
[im 146/176]
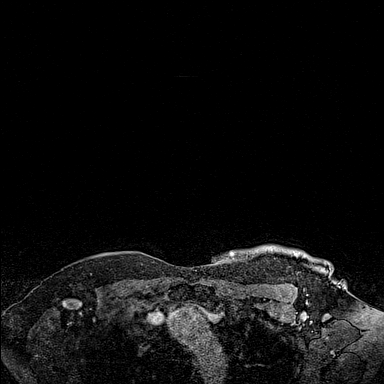
[im 176/176]
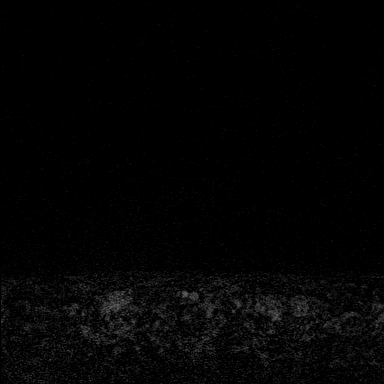

[31 of 48 positions shown; findings below may reference images not displayed]

THREE-DIMENSIONAL MR IMAGE RENDERING ON INDEPENDENT WORKSTATION:

Three-dimensional MR images were rendered by post-processing of the
original MR data on an independent workstation. The
three-dimensional MR images were interpreted, and findings are
reported in the following complete MRI report for this study. Three
dimensional images were evaluated at the independent [employer]
workstation
FINDINGS: Breast composition: b. Scattered fibroglandular tissue.

Background parenchymal enhancement: Mild

Right breast: Within the upper-outer right breast there is a 3.3 x
2.6 x 2.9 cm solid mass with cystic components, potentially
representing associated necrosis or post biopsy changes. The solid
component measures 2.4 x 2.2 x 2.2 cm. No additional suspicious
areas of enhancement identified within the right breast.

Left breast: No mass or abnormal enhancement.

Lymph nodes: No abnormal appearing lymph nodes.

Ancillary findings:  Moderate hiatal hernia.
IMPRESSION: Enhancing mass within the upper-outer right breast, felt to
correspond with recently diagnosed malignancy.

No additional suspicious areas of enhancement identified within
either breast.

RECOMMENDATION:
No outside biopsy images or clip films were available at the time of
MRI interpretation. The initial diagnostic mammogram was available.
Recommend confirmation that a biopsy marking clip was placed prior
to neoadjuvant chemotherapy. If these films are not able to be
obtained, a diagnostic right breast mammogram can be obtained at the
[REDACTED] prior to administering treatment.

Treatment plan for known right breast malignancy.

BI-RADS CATEGORY  6: Known biopsy-proven malignancy.

## 2020-01-01 IMAGING — US US AXILLARY RIGHT
1 series · 7 of 7 positions shown · non-contrast
Comparison: Previous exam(s).

CLINICAL DATA: Right axillary ultrasound

EXAM:
ULTRASOUND OF THE RIGHT BREAST

[Series 1: us axillary right · 0.07mm/px · 7 of 7 slices shown]
[im 1/7]
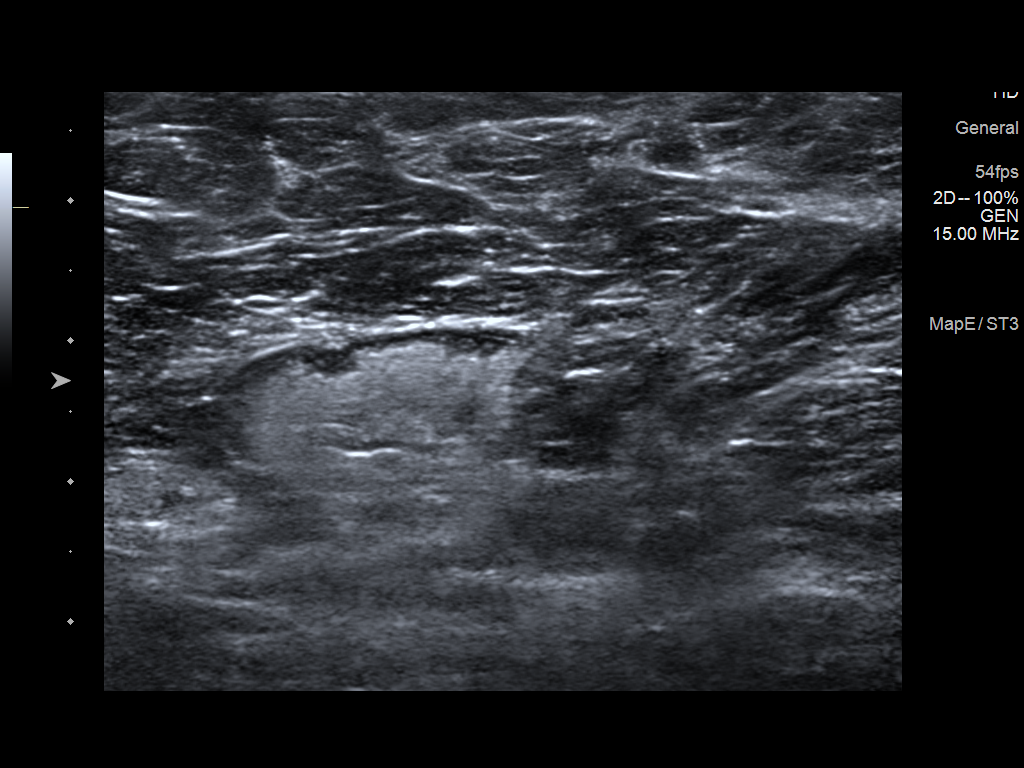
[im 2/7]
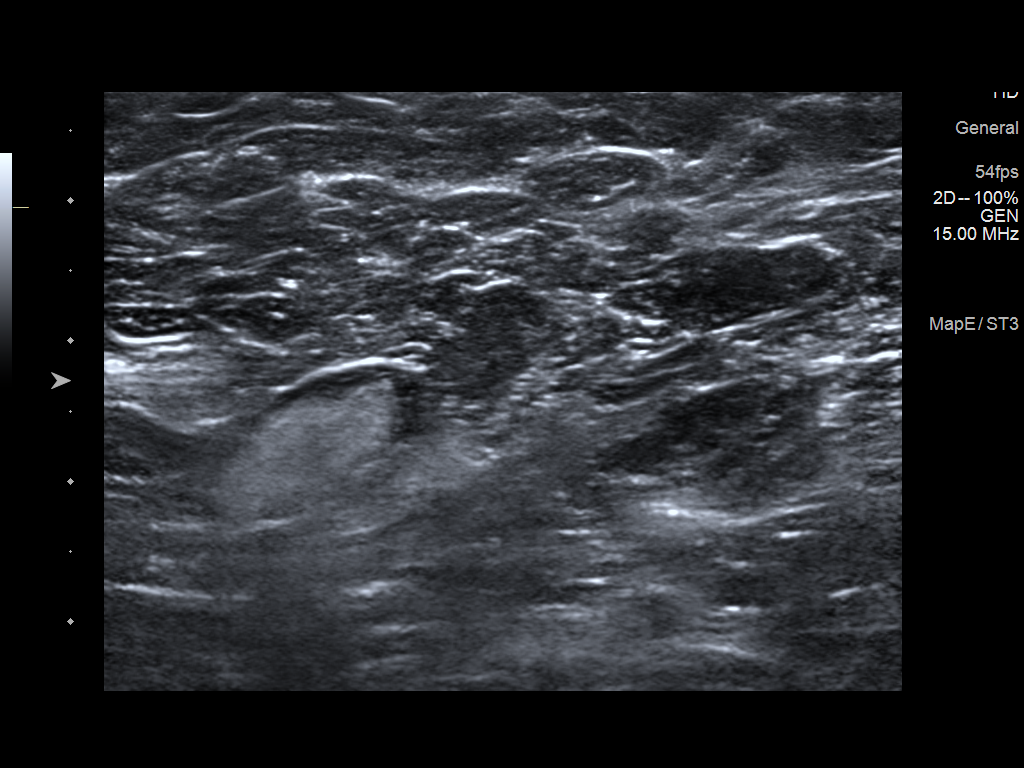
[im 3/7]
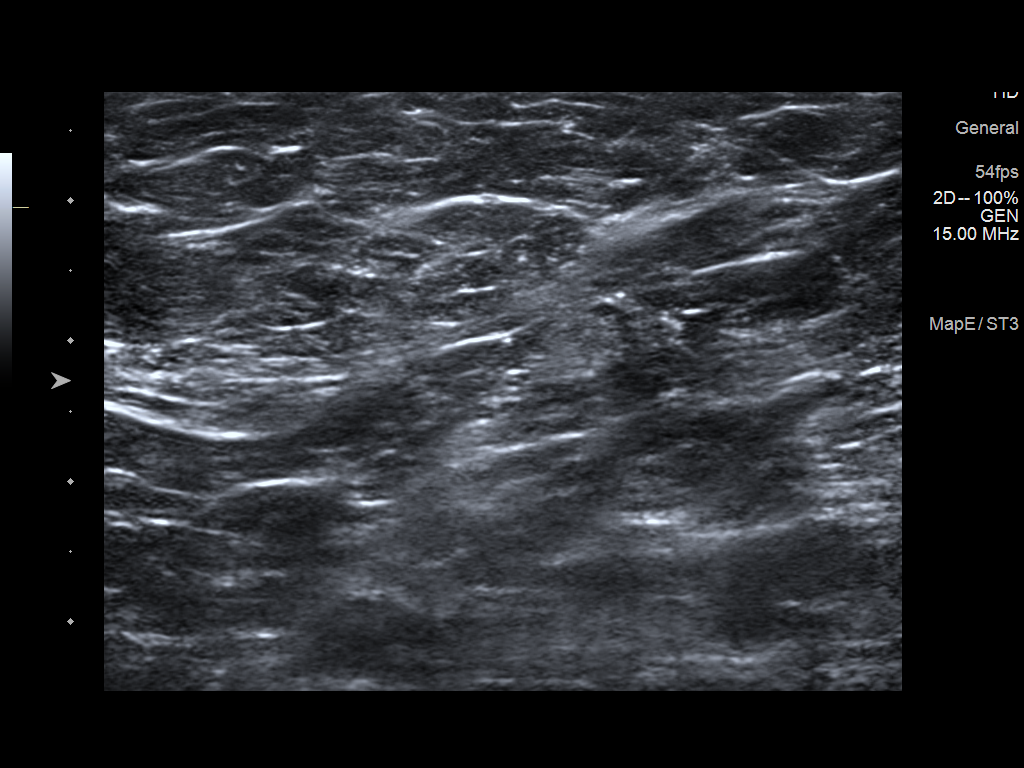
[im 4/7]
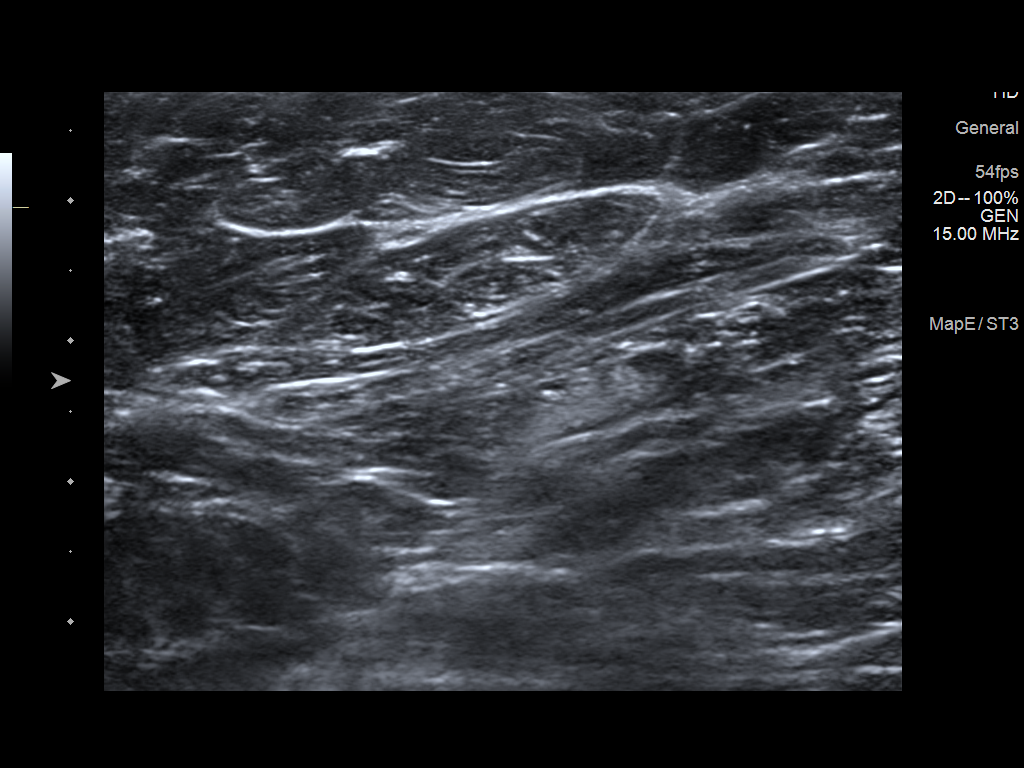
[im 5/7]
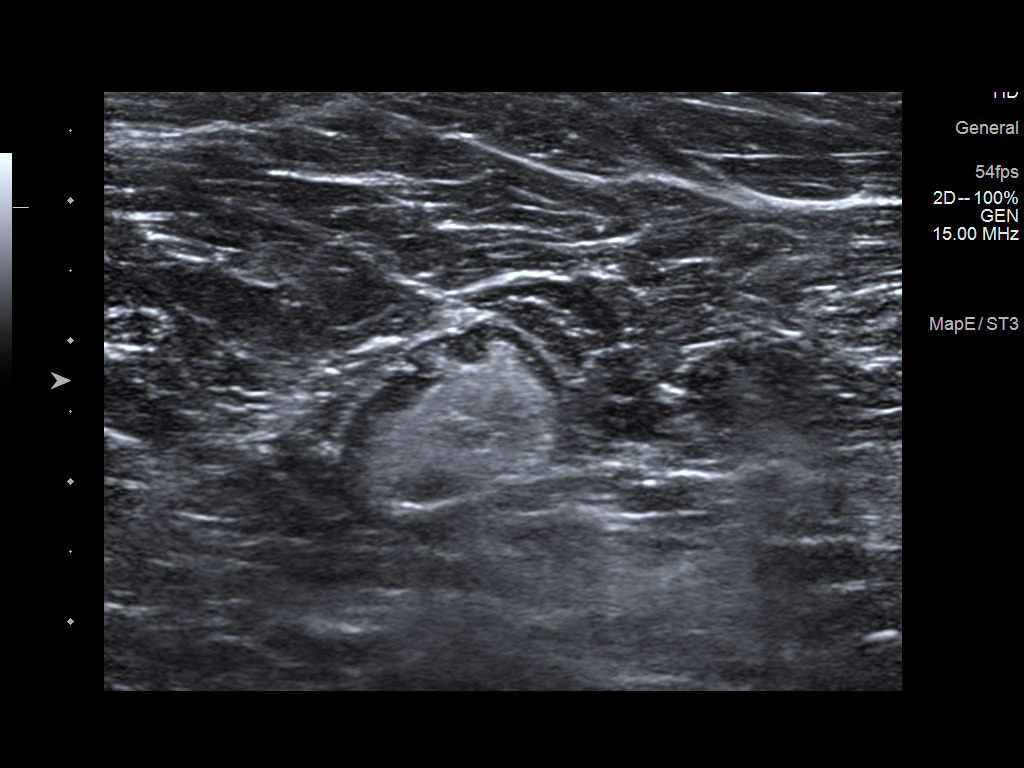
[im 6/7]
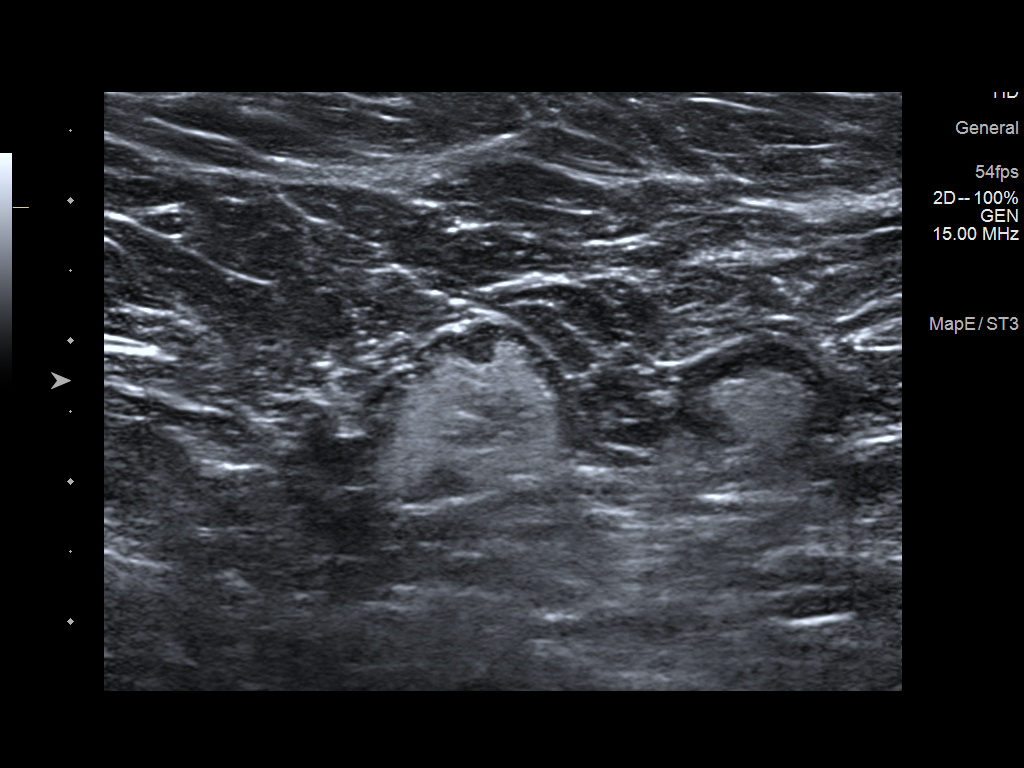
[im 7/7]
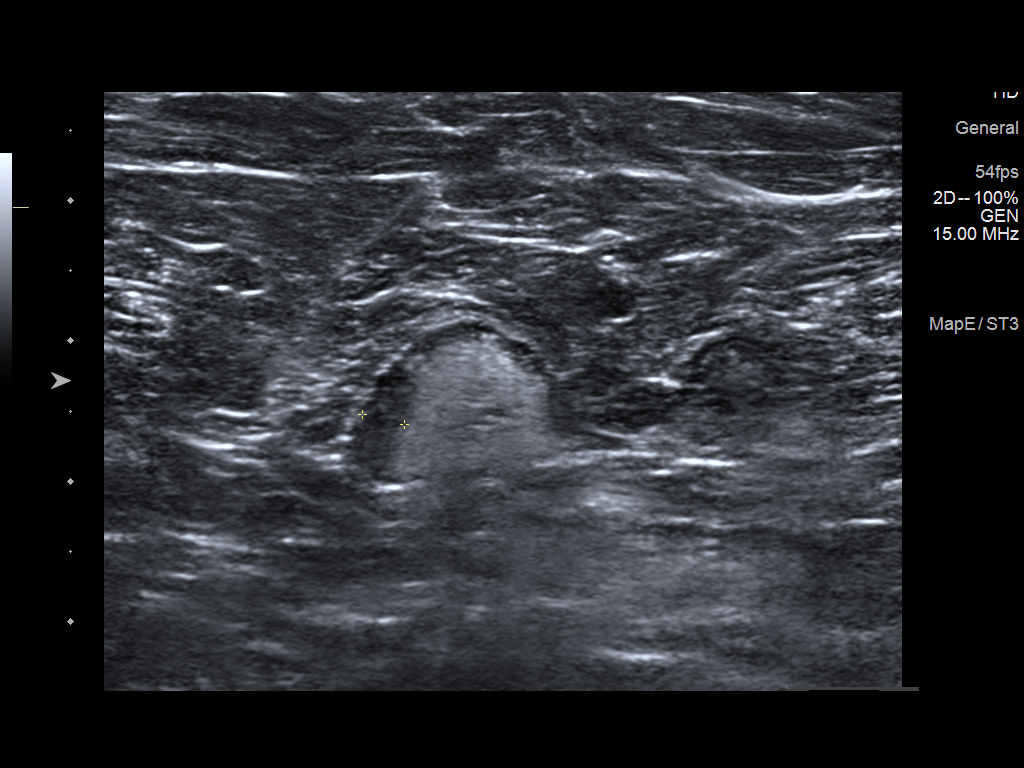

[7 of 7 positions shown; findings below may reference images not displayed]

FINDINGS: On physical exam, no suspicious lumps identified.

Targeted ultrasound is performed, showing numerous normal appearing
lymph nodes in the right axilla. Additionally, the lymph nodes are
normal on the MRI recently performed. Finally, mildly prominent
nodes in the right axilla seen mammographically are unchanged since
Sunday March, 2016 and were noted to be normal with thin cortices and
preserved fatty hila on today's ultrasound.
IMPRESSION: No axillary adenopathy.

RECOMMENDATION:
Continued follow-up by oncology and surgery.

I have discussed the findings and recommendations with the patient.
Results were also provided in writing at the conclusion of the
visit. If applicable, a reminder letter will be sent to the patient
regarding the next appointment.

BI-RADS CATEGORY  2: Benign.

## 2020-02-01 ENCOUNTER — Other Ambulatory Visit: Payer: Self-pay | Admitting: Oncology

## 2020-02-01 DIAGNOSIS — Z171 Estrogen receptor negative status [ER-]: Secondary | ICD-10-CM

## 2020-02-01 DIAGNOSIS — C50411 Malignant neoplasm of upper-outer quadrant of right female breast: Secondary | ICD-10-CM

## 2020-02-03 ENCOUNTER — Telehealth: Payer: Self-pay | Admitting: Adult Health

## 2020-02-03 NOTE — Telephone Encounter (Signed)
Rescheduled 5/3 appt due to provider template change. Left voicemail regarding cancellation and new appt date and time.

## 2020-02-06 ENCOUNTER — Inpatient Hospital Stay: Payer: Medicare Other | Admitting: Adult Health

## 2020-02-06 ENCOUNTER — Inpatient Hospital Stay: Payer: Medicare Other

## 2020-02-08 ENCOUNTER — Other Ambulatory Visit: Payer: Medicare Other

## 2020-02-08 ENCOUNTER — Inpatient Hospital Stay: Payer: Medicare Other | Admitting: Adult Health

## 2020-02-14 ENCOUNTER — Inpatient Hospital Stay (HOSPITAL_BASED_OUTPATIENT_CLINIC_OR_DEPARTMENT_OTHER): Payer: Medicare Other | Admitting: Adult Health

## 2020-02-14 ENCOUNTER — Inpatient Hospital Stay: Payer: Medicare Other | Attending: Adult Health

## 2020-02-14 ENCOUNTER — Encounter: Payer: Self-pay | Admitting: Adult Health

## 2020-02-14 ENCOUNTER — Other Ambulatory Visit: Payer: Self-pay

## 2020-02-14 VITALS — BP 156/79 | HR 69 | Temp 98.7°F | Resp 18 | Ht 62.0 in | Wt 175.7 lb

## 2020-02-14 DIAGNOSIS — C50411 Malignant neoplasm of upper-outer quadrant of right female breast: Secondary | ICD-10-CM | POA: Insufficient documentation

## 2020-02-14 DIAGNOSIS — E039 Hypothyroidism, unspecified: Secondary | ICD-10-CM

## 2020-02-14 DIAGNOSIS — Z79899 Other long term (current) drug therapy: Secondary | ICD-10-CM | POA: Diagnosis not present

## 2020-02-14 DIAGNOSIS — Z171 Estrogen receptor negative status [ER-]: Secondary | ICD-10-CM | POA: Diagnosis not present

## 2020-02-14 LAB — CBC WITH DIFFERENTIAL/PLATELET
Abs Immature Granulocytes: 0.01 10*3/uL (ref 0.00–0.07)
Basophils Absolute: 0 10*3/uL (ref 0.0–0.1)
Basophils Relative: 1 %
Eosinophils Absolute: 0.1 10*3/uL (ref 0.0–0.5)
Eosinophils Relative: 2 %
HCT: 40.8 % (ref 36.0–46.0)
Hemoglobin: 12.8 g/dL (ref 12.0–15.0)
Immature Granulocytes: 0 %
Lymphocytes Relative: 28 %
Lymphs Abs: 1.1 10*3/uL (ref 0.7–4.0)
MCH: 32.4 pg (ref 26.0–34.0)
MCHC: 31.4 g/dL (ref 30.0–36.0)
MCV: 103.3 fL — ABNORMAL HIGH (ref 80.0–100.0)
Monocytes Absolute: 0.6 10*3/uL (ref 0.1–1.0)
Monocytes Relative: 15 %
Neutro Abs: 2 10*3/uL (ref 1.7–7.7)
Neutrophils Relative %: 54 %
Platelets: 213 10*3/uL (ref 150–400)
RBC: 3.95 MIL/uL (ref 3.87–5.11)
RDW: 13.7 % (ref 11.5–15.5)
WBC: 3.8 10*3/uL — ABNORMAL LOW (ref 4.0–10.5)
nRBC: 0 % (ref 0.0–0.2)

## 2020-02-14 LAB — COMPREHENSIVE METABOLIC PANEL
ALT: 16 U/L (ref 0–44)
AST: 18 U/L (ref 15–41)
Albumin: 3.8 g/dL (ref 3.5–5.0)
Alkaline Phosphatase: 69 U/L (ref 38–126)
Anion gap: 11 (ref 5–15)
BUN: 13 mg/dL (ref 8–23)
CO2: 27 mmol/L (ref 22–32)
Calcium: 9.5 mg/dL (ref 8.9–10.3)
Chloride: 105 mmol/L (ref 98–111)
Creatinine, Ser: 0.9 mg/dL (ref 0.44–1.00)
GFR calc Af Amer: >60 mL/min (ref >60)
GFR calc non Af Amer: >60 mL/min (ref >60)
Glucose, Bld: 101 mg/dL — ABNORMAL HIGH (ref 70–99)
Potassium: 4.1 mmol/L (ref 3.5–5.1)
Sodium: 143 mmol/L (ref 135–145)
Total Bilirubin: 0.3 mg/dL (ref 0.3–1.2)
Total Protein: 7.3 g/dL (ref 6.5–8.1)

## 2020-02-14 LAB — TSH: TSH: 4.272 u[IU]/mL — ABNORMAL HIGH (ref 0.308–3.960)

## 2020-02-14 NOTE — Progress Notes (Signed)
[place]  Telephone:([phone]) [phone] Fax:(336) [phone]     ID: Veronica Price DOB: 25-Nov-1945  MR#: [id number]  XIP#:[id number]  Patient Care Team: Veronica Pain, FNP as PCP - General (Nurse Practitioner) Veronica Price, Veronica Dad, MD as Consulting Physician (Oncology) Veronica Skates, MD as Consulting Physician (General Surgery) Veronica Klinefelter, MD as Radiation Oncologist (Radiation Oncology) OTHER MD:   CHIEF COMPLAINT: Triple negative breast cancer  CURRENT TREATMENT: Observation   INTERVAL HISTORY: Veronica Price returns today for follow-up of her triple negative breast cancer. She is on observation.    Veronica Price is doing well.  She notes she underwent her mammogram at [facility].  We do not have records of this and will need to reach out.  She says that she is doing well and has no other needs at this time.     REVIEW OF SYSTEMS: Veronica Price is seeing her PCP regularly.  She is not exercising as much as she wants to due to her knees aching.   Veronica Price is otherwise doing well and recently saw her grandson graduate from [employer].  She denies any new issues such as fever, chills, chest Price, palpitations, cough, shortness of breath, nausea, vomiting, bowel/bladder changes, headaches, or other concerns.  A detailed ROS was otherwise non contributory.     HISTORY OF CURRENT ILLNESS: Veronica Price initially palpated a mass to her right breast in February 2019.   She brought her to her primary care physician's attention and was set up for bilateral diagnostic mammography with tomography and right breast ultrasonography on 11/25/2017 showing: a 3.2 x 3.7 cm lobulated mass in the UOQ of the right breast; there was a 3 cm calcified retroareolar mass in the left breast. On right breast ultrasonography there was a complex mostly solid 2.4 x 3.1 cm mass at 10 o'clock and multiple enlarged right axillary lymph nodes, the largest measuring 1.6 x 2.4.  Accordingly on  11/27/2017 the patient proceeded to biopsy of the right breast area in question, as well as an attempted axillary node biopsy. The pathology from this procedure showed ([id number]):  high grade infiltrating ductal carcinoma with no lymphovascular invasion.  The attempted axillary node biopsy showed fat, no nodal tissue.  Prognostic indicators were: estrogen and progesterone receptor negative,. Proliferation marker Ki67 at >80%. HER2 negative by immunohistochemistry at 0%  CT scans of the chest abdomen and pelvis completed on 12/10/2017 showed A complex solid and cystic 3.5 cm right breast mass with several bilateral axillary lymph nodes, the largest in the right axilla measured 12 mm. There was no evidence of visceral metastatic disease.   She had a bone scan on 12/10/2017 that showed two areas of increased uptake overlying the lateral aspects of the T11 vertebrae. Review of the CT scan of the chest revealed sclerosis of the T11 pedicles concerning for bone metastases.  However subsequent spinal MRI (I do not have that report) read this out as arthritis, not a metastatic deposit  The patient had a echocardiogram completed on 3/72019 with results showing: left ventricular ejection fraction of 55-60% with mild concentric hypertrophy. There was mild to moderate mitral and mild tricuspid regurgitation with mild pulmonary hypertension. The aortic valve was mildly thickened with trace aortic regurgitation.   The patient's subsequent history is as detailed below.   PAST MEDICAL HISTORY: Past Medical History:  Diagnosis Date  . Arthritis 2003  . Breast cancer ([place]) 11/25/2017   Right breast  . Cataract 2016   "early" per  patient-no surgery yet   . GERD (gastroesophageal reflux disease) 2003   per patient report  . Hypothyroidism 10/20/2018    PAST SURGICAL HISTORY: Past Surgical History:  Procedure Laterality Date  . ABDOMINAL HYSTERECTOMY  2002   Total hysterectomy in 2002  . BREAST LUMPECTOMY WITH  RADIOACTIVE SEED AND SENTINEL LYMPH NODE BIOPSY Right 06/28/2018   Procedure: BREAST LUMPECTOMY WITH RADIOACTIVE SEED AND SENTINEL LYMPH NODE BIOPSY;  Surgeon: Veronica Skates, MD;  Location: [place];  Service: General;  Laterality: Right;  . PORTACATH PLACEMENT N/A 12/29/2017   Procedure: INSERTION PORT-A-CATH LEFT SUBCLAVIAN;  Surgeon: Veronica Skates, MD;  Location: [place];  Service: General;  Laterality: N/A;    FAMILY HISTORY Family History  Problem Relation Age of Onset  . Diabetes Father   . Heart attack Father    She notes that her father died from a possible MI in his early 57's Her mother is still alive and will be 66 March 2019!  The patient has 2 brothers and 2 sisters. Her brother had prostate cancer possibly agent orange related. She has a cousin who was recently diagnosed with stomach cancer. Her maternal grandmother died from colon cancer. She has a maternal uncle with bone cancer. She denies family hx of breast or ovarian cancer.    GYNECOLOGIC HISTORY:  Menarche: 74 years old Age at first live birth: 74 years old [place] P3 LMP: at age 52 Contraceptive: OCP HRT   Hysterectomy? 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 She also worked at [employer]. She lives at home with her husband, Veronica Price who is a retired Building control surveyor, her husband works part time driving an adult day care bus. Her daughter, Veronica Price is a Multimedia programmer. Her daughter Veronica Price lives in [place], [place] and works as a Therapist, sports for the [place]. Her son Veronica Price, lives in [place], and works as a Administrator.  The patient has 3 grandchildren and no great-grandchildren. She is of baptist faith.     ADVANCED DIRECTIVES:    HEALTH MAINTENANCE: Social History   Tobacco Use  . Smoking status: Never Smoker  . Smokeless tobacco: Never Used  Substance Use Topics  . Alcohol use: Never  . Drug use:  Never     Colonoscopy: UTD; last year (2018)  PAP:  Bone density:    Allergies  Allergen Reactions  . Prednisone Swelling and Other (See Comments)    " I think it was this that made my tongue swell "--Patient has received steroid injections without any issues--[facility] 04/12/2019    Current Outpatient Medications  Medication Sig Dispense Refill  . levothyroxine (SYNTHROID) 50 MCG tablet TAKE 1 TABLET BY MOUTH DAILY BEFORE BREAKFAST 30 tablet 1  . naproxen sodium (ALEVE) 220 MG tablet Take 220 mg by mouth daily as needed (for Price).     No current facility-administered medications for this visit.   Facility-Administered Medications Ordered in Other Visits  Medication Dose Route Frequency Provider Last Rate Last Admin  . heparin lock flush 100 unit/mL  250 Units Intracatheter Once Veronica Price, Veronica Dad, MD        OBJECTIVE: Middle-aged African-American woman who appears stated age  74:   02/14/20 1120  BP: (!) 156/79  Pulse: 69  Resp: 18  Temp: 98.7 F (37.1 C)  SpO2: 100%     Body mass index is 32.14 kg/m.   Wt  Readings from Last 3 Encounters:  02/14/20 175 lb 11.2 oz (79.7 kg)  08/09/19 178 lb 4.8 oz (80.9 kg)  05/24/19 177 lb 6.4 oz (80.5 kg)  ECOG FS:1 - Symptomatic but completely ambulatory GENERAL: Patient is a well appearing female in no acute distress HEENT:  Sclerae anicteric.  Oropharynx clear and moist. No ulcerations or evidence of oropharyngeal candidiasis. Neck is supple.  NODES:  No cervical, supraclavicular, or axillary lymphadenopathy palpated.  BREAST EXAM:  Right breast s/p lumpectomy and radiation, no sign of local recurrence, left breast benign LUNGS:  Clear to auscultation bilaterally.  No wheezes or rhonchi. HEART:  Regular rate and rhythm. No murmur appreciated. ABDOMEN:  Soft, nontender.  Positive, normoactive bowel sounds. No organomegaly palpated. MSK:  No focal spinal tenderness to palpation. Full range of motion bilaterally in the upper  extremities. EXTREMITIES:  No peripheral edema.   SKIN:  Clear with no obvious rashes or skin changes. No nail dyscrasia. NEURO:  Nonfocal. Well oriented.  Appropriate affect.     LAB RESULTS:  CMP     Component Value Date/Time   NA 143 02/14/2020 1010   K 4.1 02/14/2020 1010   CL 105 02/14/2020 1010   CO2 27 02/14/2020 1010   GLUCOSE 101 (H) 02/14/2020 1010   BUN 13 02/14/2020 1010   CREATININE 0.90 02/14/2020 1010   CREATININE 0.89 02/08/2019 0805   CALCIUM 9.5 02/14/2020 1010   PROT 7.3 02/14/2020 1010   ALBUMIN 3.8 02/14/2020 1010   AST 18 02/14/2020 1010   AST 17 02/08/2019 0805   ALT 16 02/14/2020 1010   ALT 13 02/08/2019 0805   ALKPHOS 69 02/14/2020 1010   BILITOT 0.3 02/14/2020 1010   BILITOT 0.3 02/08/2019 0805   GFRNONAA >60 02/14/2020 1010   GFRNONAA >60 02/08/2019 0805   GFRAA >60 02/14/2020 1010   GFRAA >60 02/08/2019 0805    No results found for: TOTALPROTELP, ALBUMINELP, A1GS, A2GS, BETS, BETA2SER, GAMS, MSPIKE, SPEI  No results found for: KPAFRELGTCHN, LAMBDASER, KAPLAMBRATIO  Lab Results  Component Value Date   WBC 3.8 (L) 02/14/2020   NEUTROABS 2.0 02/14/2020   HGB 12.8 02/14/2020   HCT 40.8 02/14/2020   MCV 103.3 (H) 02/14/2020   PLT 213 02/14/2020    @LASTCHEMISTRY @  No results found for: LABCA2  No components found for: QMKJIZ128  No results for input(s): INR in the last 168 hours.  No results found for: LABCA2  No results found for: FVW867  No results found for: RJP366  No results found for: KDP947  No results found for: CA2729  No components found for: HGQUANT  No results found for: CEA1 / No results found for: CEA1   No results found for: AFPTUMOR  No results found for: CHROMOGRNA  No results found for: PSA1  Appointment on 02/14/2020  Component Date Value Ref Range Status  . WBC 02/14/2020 3.8* 4.0 - 10.5 K/uL Final  . RBC 02/14/2020 3.95  3.87 - 5.11 MIL/uL Final  . Hemoglobin 02/14/2020 12.8  12.0 - 15.0  g/dL Final  . HCT 02/14/2020 40.8  36.0 - 46.0 % Final  . MCV 02/14/2020 103.3* 80.0 - 100.0 fL Final  . MCH 02/14/2020 32.4  26.0 - 34.0 pg Final  . MCHC 02/14/2020 31.4  30.0 - 36.0 g/dL Final  . RDW 02/14/2020 13.7  11.5 - 15.5 % Final  . Platelets 02/14/2020 213  150 - 400 K/uL Final  . nRBC 02/14/2020 0.0  0.0 - 0.2 % Final  .  Neutrophils Relative % 02/14/2020 54  % Final  . Neutro Abs 02/14/2020 2.0  1.7 - 7.7 K/uL Final  . Lymphocytes Relative 02/14/2020 28  % Final  . Lymphs Abs 02/14/2020 1.1  0.7 - 4.0 K/uL Final  . Monocytes Relative 02/14/2020 15  % Final  . Monocytes Absolute 02/14/2020 0.6  0.1 - 1.0 K/uL Final  . Eosinophils Relative 02/14/2020 2  % Final  . Eosinophils Absolute 02/14/2020 0.1  0.0 - 0.5 K/uL Final  . Basophils Relative 02/14/2020 1  % Final  . Basophils Absolute 02/14/2020 0.0  0.0 - 0.1 K/uL Final  . Immature Granulocytes 02/14/2020 0  % Final  . Abs Immature Granulocytes 02/14/2020 0.01  0.00 - 0.07 K/uL Final   Performed at [facility], [place] [address] [place], [place] [address]  . Sodium 02/14/2020 143  135 - 145 mmol/L Final  . Potassium 02/14/2020 4.1  3.5 - 5.1 mmol/L Final  . Chloride 02/14/2020 105  98 - 111 mmol/L Final  . CO2 02/14/2020 27  22 - 32 mmol/L Final  . Glucose, Bld 02/14/2020 101* 70 - 99 mg/dL Final   Glucose reference range applies only to samples taken after fasting for at least 8 hours.  . BUN 02/14/2020 13  8 - 23 mg/dL Final  . Creatinine, Ser 02/14/2020 0.90  0.44 - 1.00 mg/dL Final  . Calcium 02/14/2020 9.5  8.9 - 10.3 mg/dL Final  . Total Protein 02/14/2020 7.3  6.5 - 8.1 g/dL Final  . Albumin 02/14/2020 3.8  3.5 - 5.0 g/dL Final  . AST 02/14/2020 18  15 - 41 U/L Final  . ALT 02/14/2020 16  0 - 44 U/L Final  . Alkaline Phosphatase 02/14/2020 69  38 - 126 U/L Final  . Total Bilirubin 02/14/2020 0.3  0.3 - 1.2 mg/dL Final  . GFR calc non Af Amer 02/14/2020 >60  >60 mL/min Final  . GFR calc Af  Amer 02/14/2020 >60  >60 mL/min Final  . Anion gap 02/14/2020 11  5 - 15 Final   Performed at [facility], [place] [address], [place], [place] [address]  . TSH 02/14/2020 4.272* 0.308 - 3.960 uIU/mL Final   Performed at [facility], [place] [address], [place], [place] [address]    (this displays the last labs from the last 3 days)  No results found for: TOTALPROTELP, ALBUMINELP, A1GS, A2GS, BETS, BETA2SER, GAMS, MSPIKE, SPEI (this displays SPEP labs)  No results found for: KPAFRELGTCHN, LAMBDASER, KAPLAMBRATIO (kappa/lambda light chains)  No results found for: HGBA, HGBA2QUANT, HGBFQUANT, HGBSQUAN (Hemoglobinopathy evaluation)   No results found for: LDH  No results found for: IRON, TIBC, IRONPCTSAT (Iron and TIBC)  No results found for: FERRITIN  Urinalysis No results found for: COLORURINE, APPEARANCEUR, LABSPEC, PHURINE, GLUCOSEU, HGBUR, BILIRUBINUR, KETONESUR, PROTEINUR, UROBILINOGEN, NITRITE, LEUKOCYTESUR   STUDIES: No results found.  ELIGIBLE FOR AVAILABLE RESEARCH PROTOCOL:  SWOG [phone] (observation arm)  ASSESSMENT: 74 y.o. Veronica Price, [place] woman status post right breast upper outer quadrant biopsy 11/25/2017 for a clinical T2 N2, stage IIIc invasive ductal carcinoma, grade 3, triple negative, with an MIB-1 of 80%.  (1) staging studies:  (a) chest CT 12/10/2017 scan showed no visceral metastatic disease  (b) bone scan 12/10/2017 showed uptake at T11, with sclerosis.  (c) CA-27-29 on 12/05/2017 was 19.0  (d) spinal MRI in [place] reportedly showed only arthrtis at T11  (2) neoadjuvant chemotherapy consisting of carboplatin/ paclitaxel x 12 (received 9)  started 01/04/2018, discontinued 03/15/2018, followed by cyclophosphamide and doxorubicin given every 3 weeks x 4 starting 04/05/2018, completed 06/14/2018  (a) carboplatin/paclitaxel changed to carboplatin/gemcitabine after 8 cycles due to neuroapthy  (b) only able to tolerate 1  cycle of Gemcitabine/Carboplatin due to rash  (a) echocardiogram on 04/02/18 demonstrates a LVEF of 60-65%  (3) status post right lumpectomy and sentinel lymph node sampling on 06/28/2018 showing a residual 2.2 cm, grade 3 invasive ductal carcinoma, with negative margins and 0 of 3 sentinel lymph nodes removed involved (ypT2 ypN0)  (4) adjuvant radiation through the [facility] group in [place] mid November through mid December 2019  (5) participating in the pembrolizumab study S1418, randomized to observation  (6) pembrolizumab started (not as part of study) 11/15/2018, repeated every 21 days, complicated by rash, last dose 05/24/2019  PLAN:  Veronica Price is doing well today.  She has no sign of breast cancer recurrence today and this is great news.  She continues on observation.  I have asked my nruse to reach out to [facility] to get a copy of her most recent mammogram, as we do not have this on file.    Veronica Price and I talked about healthy lifestyle.  I recommended healthy diet and exercise today.  I also recommended that she stay up to date with her other cancer screenings, such as colon cancer screening, skin screenings with her PCP.    We will see her back in 6 months for labs and f/u.  She was recommended to continue with the appropriate pandemic precautions. She knows to call for any questions that may arise between now and her next appointment.  We are happy to see her sooner if needed.  Total encounter time: 20 minutes*  Wilber Bihari, NP 02/14/20 11:56 AM Medical Oncology and Hematology [facility] [place], [place] [address] Tel. [phone]    Fax. [phone]  *Total Encounter Time as defined by the Centers for Medicare and Medicaid Services includes, in addition to the face-to-face time of a patient visit (documented in the note above) non-face-to-face time: obtaining and reviewing outside history, ordering and reviewing medications, tests or procedures, care  coordination (communications with other health care professionals or caregivers) and documentation in the medical record.

## 2020-02-15 ENCOUNTER — Telehealth: Payer: Self-pay | Admitting: Oncology

## 2020-02-15 NOTE — Telephone Encounter (Signed)
Scheduled appts per 5/11 los. Pt confirmed appt date and time.  

## 2020-03-26 ENCOUNTER — Other Ambulatory Visit: Payer: Self-pay | Admitting: Oncology

## 2020-03-26 DIAGNOSIS — Z171 Estrogen receptor negative status [ER-]: Secondary | ICD-10-CM

## 2020-04-20 ENCOUNTER — Other Ambulatory Visit: Payer: Self-pay | Admitting: Oncology

## 2020-04-20 DIAGNOSIS — Z171 Estrogen receptor negative status [ER-]: Secondary | ICD-10-CM

## 2020-05-08 DIAGNOSIS — E039 Hypothyroidism, unspecified: Secondary | ICD-10-CM | POA: Diagnosis not present

## 2020-05-08 DIAGNOSIS — Z7689 Persons encountering health services in other specified circumstances: Secondary | ICD-10-CM | POA: Diagnosis not present

## 2020-05-08 DIAGNOSIS — Z1211 Encounter for screening for malignant neoplasm of colon: Secondary | ICD-10-CM | POA: Diagnosis not present

## 2020-05-23 ENCOUNTER — Other Ambulatory Visit: Payer: Self-pay | Admitting: Oncology

## 2020-05-23 DIAGNOSIS — C50411 Malignant neoplasm of upper-outer quadrant of right female breast: Secondary | ICD-10-CM

## 2020-06-18 DIAGNOSIS — M170 Bilateral primary osteoarthritis of knee: Secondary | ICD-10-CM | POA: Diagnosis not present

## 2020-06-19 ENCOUNTER — Other Ambulatory Visit: Payer: Self-pay | Admitting: Oncology

## 2020-06-19 DIAGNOSIS — Z171 Estrogen receptor negative status [ER-]: Secondary | ICD-10-CM

## 2020-07-09 DIAGNOSIS — M170 Bilateral primary osteoarthritis of knee: Secondary | ICD-10-CM | POA: Diagnosis not present

## 2020-07-11 ENCOUNTER — Ambulatory Visit: Payer: Self-pay | Admitting: Physician Assistant

## 2020-07-11 NOTE — H&P (Signed)
TOTAL KNEE ADMISSION H&P  Patient is being admitted for right total knee arthroplasty.  Subjective:  Chief Complaint:right knee pain.  HPI: Veronica Price, 74 y.o. female, has a history of pain and functional disability in the right knee due to arthritis and has failed non-surgical conservative treatments for greater than 12 weeks to includecorticosteriod injections, use of assistive devices and activity modification.  Onset of symptoms was gradual, starting 7 years ago with gradually worsening course since that time. The patient noted no past surgery on the right knee(s).  Patient currently rates pain in the right knee(s) at 7 out of 10 with activity. Patient has night pain, worsening of pain with activity and weight bearing, pain that interferes with activities of daily living, pain with passive range of motion, crepitus and joint swelling.  Patient has evidence of periarticular osteophytes and joint space narrowing by imaging studies.  There is no active infection.  Patient Active Problem List   Diagnosis Date Noted  . Hypothyroidism (acquired) 10/19/2018  . Other long term (current) drug therapy 10/18/2018  . Encounter for immunotherapy 10/04/2018  . Port-A-Cath in place 01/04/2018  . Malignant neoplasm of upper-outer quadrant of right breast in female, estrogen receptor negative ([place]) 12/21/2017  . Depression 04/14/2008  . GERD 04/14/2008  . 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 Arthritis 2003  . Breast cancer ([place]) 11/25/2017   Right breast  . Cataract 2016   "early" per patient-no surgery yet   . GERD (gastroesophageal reflux disease) 2003   per patient report  . Hypothyroidism 10/20/2018    Past Surgical History:  Procedure Laterality Date  . ABDOMINAL HYSTERECTOMY  2002   Total hysterectomy in 2002  . BREAST LUMPECTOMY WITH RADIOACTIVE SEED AND SENTINEL LYMPH NODE BIOPSY Right 06/28/2018   Procedure: BREAST LUMPECTOMY WITH RADIOACTIVE  SEED AND SENTINEL LYMPH NODE BIOPSY;  Surgeon: [name], MD;  Location: [place];  Service: General;  Laterality: Right;  . PORTACATH PLACEMENT N/A 12/29/2017   Procedure: INSERTION PORT-A-CATH LEFT SUBCLAVIAN;  Surgeon: [name], MD;  Location: [place];  Service: General;  Laterality: N/A;    Current Outpatient Medications  Medication Sig Dispense Refill Last Dose  . levothyroxine (SYNTHROID) 50 MCG tablet TAKE 1 TABLET BY MOUTH DAILY BEFORE BREAKFAST 30 tablet 1   . naproxen sodium (ALEVE) 220 MG tablet Take 220 mg by mouth daily as needed (for pain).      No current facility-administered medications for this visit.   Allergies  Allergen Reactions  . Prednisone Swelling and Other (See Comments)    " I think it was this that made my tongue swell "--Patient has received steroid injections without any issues--[facility] 04/12/2019    Social History   Tobacco Use  . Smoking status: Never Smoker  . Smokeless tobacco: Never Used  Substance Use Topics  . Alcohol use: Never    Family History  Problem Relation Age of Onset  . Diabetes Father   . Heart attack Father      Review of Systems  Musculoskeletal: Positive for arthralgias and joint swelling.  Neurological: Positive for headaches.  Hematological: Bruises/bleeds easily.  All other systems reviewed and are negative.   Objective:  Physical Exam Constitutional:      General: She is not in acute distress.    Appearance: Normal appearance.  HENT:     Head: Normocephalic and atraumatic.  Eyes:     Extraocular Movements: Extraocular movements intact.  Pupils: Pupils are equal, round, and reactive to light.  Cardiovascular:     Rate and Rhythm: Normal rate and regular rhythm.     Pulses: Normal pulses.     Heart sounds: Murmur heard.   Pulmonary:     Effort: Pulmonary effort is normal. No respiratory distress.     Breath sounds: Normal breath sounds.  Abdominal:     General: Abdomen is flat. Bowel  sounds are normal. There is no distension.     Palpations: Abdomen is soft.     Tenderness: There is no abdominal tenderness.  Musculoskeletal:     Cervical back: Normal range of motion and neck supple.     Right knee: Swelling and bony tenderness present. No effusion or erythema. Decreased range of motion. Tenderness present. No LCL laxity or MCL laxity.  Lymphadenopathy:     Cervical: No cervical adenopathy.  Skin:    General: Skin is warm and dry.     Findings: No erythema or rash.  Neurological:     General: No focal deficit present.     Mental Status: She is alert and oriented to person, place, and time.  Psychiatric:        Mood and Affect: Mood normal.        Behavior: Behavior normal.     Vital signs in last 24 hours: @VSRANGES @  Labs:   Estimated body mass index is 32.14 kg/m as calculated from the following:   Height as of 02/14/20: 5\' 2"  (1.575 m).   Weight as of 02/14/20: 79.7 kg.   Imaging Review Plain radiographs demonstrate moderate degenerative joint disease of the right knee(s). The overall alignment ismild varus. The bone quality appears to be good for age and reported activity level.      Assessment/Plan:  End stage arthritis, right knee   The patient history, physical examination, clinical judgment of the provider and imaging studies are consistent with end stage degenerative joint disease of the right knee(s) and total knee arthroplasty is deemed medically necessary. The treatment options including medical management, injection therapy arthroscopy and arthroplasty were discussed at length. The risks and benefits of total knee arthroplasty were presented and reviewed. The risks due to aseptic loosening, infection, stiffness, patella tracking problems, thromboembolic complications and other imponderables were discussed. The patient acknowledged the explanation, agreed to proceed with the plan and consent was signed. Patient is being admitted for inpatient  treatment for surgery, pain control, PT, OT, prophylactic antibiotics, VTE prophylaxis, progressive ambulation and ADL's and discharge planning. The patient is planning to be discharged home with home health services    Anticipated LOS equal to or greater than 2 midnights due to - Age 15 and older with one or more of the following:  - Obesity  - Expected need for hospital services (PT, OT, Nursing) required for safe  discharge  - Anticipated need for postoperative skilled nursing care or inpatient rehab  - Active co-morbidities: None OR   - Unanticipated findings during/Post Surgery: None  - Patient is a high risk of re-admission due to: None

## 2020-07-11 NOTE — H&P (View-Only) (Signed)
TOTAL KNEE ADMISSION H&P  Patient is being admitted for right total knee arthroplasty.  Subjective:  Chief Complaint:right knee pain.  HPI: Veronica Price, 74 y.o. female, has a history of pain and functional disability in the right knee due to arthritis and has failed non-surgical conservative treatments for greater than 12 weeks to includecorticosteriod injections, use of assistive devices and activity modification.  Onset of symptoms was gradual, starting 7 years ago with gradually worsening course since that time. The patient noted no past surgery on the right knee(s).  Patient currently rates pain in the right knee(s) at 7 out of 10 with activity. Patient has night pain, worsening of pain with activity and weight bearing, pain that interferes with activities of daily living, pain with passive range of motion, crepitus and joint swelling.  Patient has evidence of periarticular osteophytes and joint space narrowing by imaging studies.  There is no active infection.  Patient Active Problem List   Diagnosis Date Noted  . Hypothyroidism (acquired) 10/19/2018  . Other long term (current) drug therapy 10/18/2018  . Encounter for immunotherapy 10/04/2018  . Port-A-Cath in place 01/04/2018  . Malignant neoplasm of upper-outer quadrant of right breast in female, estrogen receptor negative ([place]) 12/21/2017  . Depression 04/14/2008  . GERD 04/14/2008  . 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 Arthritis 2003  . Breast cancer ([place]) 11/25/2017   Right breast  . Cataract 2016   "early" per patient-no surgery yet   . GERD (gastroesophageal reflux disease) 2003   per patient report  . Hypothyroidism 10/20/2018    Past Surgical History:  Procedure Laterality Date  . ABDOMINAL HYSTERECTOMY  2002   Total hysterectomy in 2002  . BREAST LUMPECTOMY WITH RADIOACTIVE SEED AND SENTINEL LYMPH NODE BIOPSY Right 06/28/2018   Procedure: BREAST LUMPECTOMY WITH RADIOACTIVE  SEED AND SENTINEL LYMPH NODE BIOPSY;  Surgeon: [name], MD;  Location: [facility];  Service: General;  Laterality: Right;  . PORTACATH PLACEMENT N/A 12/29/2017   Procedure: INSERTION PORT-A-CATH LEFT SUBCLAVIAN;  Surgeon: [name], MD;  Location: [facility];  Service: General;  Laterality: N/A;    Current Outpatient Medications  Medication Sig Dispense Refill Last Dose  . levothyroxine (SYNTHROID) 50 MCG tablet TAKE 1 TABLET BY MOUTH DAILY BEFORE BREAKFAST 30 tablet 1   . naproxen sodium (ALEVE) 220 MG tablet Take 220 mg by mouth daily as needed (for pain).      No current facility-administered medications for this visit.   Allergies  Allergen Reactions  . Prednisone Swelling and Other (See Comments)    " I think it was this that made my tongue swell "--Patient has received steroid injections without any issues--[facility] 04/12/2019    Social History   Tobacco Use  . Smoking status: Never Smoker  . Smokeless tobacco: Never Used  Substance Use Topics  . Alcohol use: Never    Family History  Problem Relation Age of Onset  . Diabetes Father   . Heart attack Father      Review of Systems  Musculoskeletal: Positive for arthralgias and joint swelling.  Neurological: Positive for headaches.  Hematological: Bruises/bleeds easily.  All other systems reviewed and are negative.   Objective:  Physical Exam Constitutional:      General: She is not in acute distress.    Appearance: Normal appearance.  HENT:     Head: Normocephalic and atraumatic.  Eyes:     Extraocular Movements: Extraocular movements intact.  Pupils: Pupils are equal, round, and reactive to light.  Cardiovascular:     Rate and Rhythm: Normal rate and regular rhythm.     Pulses: Normal pulses.     Heart sounds: Murmur heard.   Pulmonary:     Effort: Pulmonary effort is normal. No respiratory distress.     Breath sounds: Normal breath sounds.  Abdominal:     General: Abdomen is flat. Bowel  sounds are normal. There is no distension.     Palpations: Abdomen is soft.     Tenderness: There is no abdominal tenderness.  Musculoskeletal:     Cervical back: Normal range of motion and neck supple.     Right knee: Swelling and bony tenderness present. No effusion or erythema. Decreased range of motion. Tenderness present. No LCL laxity or MCL laxity.  Lymphadenopathy:     Cervical: No cervical adenopathy.  Skin:    General: Skin is warm and dry.     Findings: No erythema or rash.  Neurological:     General: No focal deficit present.     Mental Status: She is alert and oriented to person, place, and time.  Psychiatric:        Mood and Affect: Mood normal.        Behavior: Behavior normal.     Vital signs in last 24 hours: @VSRANGES @  Labs:   Estimated body mass index is 32.14 kg/m as calculated from the following:   Height as of 02/14/20: 5\' 2"  (1.575 m).   Weight as of 02/14/20: 79.7 kg.   Imaging Review Plain radiographs demonstrate moderate degenerative joint disease of the right knee(s). The overall alignment ismild varus. The bone quality appears to be good for age and reported activity level.      Assessment/Plan:  End stage arthritis, right knee   The patient history, physical examination, clinical judgment of the provider and imaging studies are consistent with end stage degenerative joint disease of the right knee(s) and total knee arthroplasty is deemed medically necessary. The treatment options including medical management, injection therapy arthroscopy and arthroplasty were discussed at length. The risks and benefits of total knee arthroplasty were presented and reviewed. The risks due to aseptic loosening, infection, stiffness, patella tracking problems, thromboembolic complications and other imponderables were discussed. The patient acknowledged the explanation, agreed to proceed with the plan and consent was signed. Patient is being admitted for inpatient  treatment for surgery, pain control, PT, OT, prophylactic antibiotics, VTE prophylaxis, progressive ambulation and ADL's and discharge planning. The patient is planning to be discharged home with home health services    Anticipated LOS equal to or greater than 2 midnights due to - Age 88 and older with one or more of the following:  - Obesity  - Expected need for hospital services (PT, OT, Nursing) required for safe  discharge  - Anticipated need for postoperative skilled nursing care or inpatient rehab  - Active co-morbidities: None OR   - Unanticipated findings during/Post Surgery: None  - Patient is a high risk of re-admission due to: None

## 2020-07-16 ENCOUNTER — Other Ambulatory Visit: Payer: Self-pay | Admitting: Oncology

## 2020-07-16 DIAGNOSIS — Z171 Estrogen receptor negative status [ER-]: Secondary | ICD-10-CM

## 2020-07-16 DIAGNOSIS — C50411 Malignant neoplasm of upper-outer quadrant of right female breast: Secondary | ICD-10-CM

## 2020-07-17 ENCOUNTER — Other Ambulatory Visit: Payer: Self-pay | Admitting: Oncology

## 2020-07-17 DIAGNOSIS — Z171 Estrogen receptor negative status [ER-]: Secondary | ICD-10-CM

## 2020-07-17 DIAGNOSIS — C50411 Malignant neoplasm of upper-outer quadrant of right female breast: Secondary | ICD-10-CM

## 2020-07-19 NOTE — Patient Instructions (Addendum)
DUE TO COVID-19 ONLY ONE VISITOR IS ALLOWED TO COME WITH YOU AND STAY IN THE WAITING ROOM ONLY DURING PRE OP AND PROCEDURE DAY OF SURGERY. THE 1 VISITOR  MAY VISIT WITH YOU AFTER SURGERY IN YOUR PRIVATE ROOM DURING VISITING HOURS ONLY!  YOU NEED TO HAVE A COVID 19 TEST ON: 07/31/20 @ 9:00 AM, THIS TEST MUST BE DONE BEFORE SURGERY,  COVID TESTING SITE [place] [id number], IT IS ON THE RIGHT GOING OUT [address] APPROXIMATELY  2 MINUTES PAST [employer] ON THE RIGHT. ONCE YOUR COVID TEST IS COMPLETED,  PLEASE BEGIN THE QUARANTINE INSTRUCTIONS AS OUTLINED IN YOUR HANDOUT.                [name]    Your procedure is scheduled on: 08/03/20   Report to [facility] Main  Entrance   Report to short stay at: 5:30 AM     Call this number if you have problems the morning of surgery [phone]    Remember:   NO SOLID FOOD AFTER MIDNIGHT THE NIGHT PRIOR TO SURGERY. NOTHING BY MOUTH EXCEPT CLEAR LIQUIDS UNTIL: 4:30 AM . PLEASE FINISH ENSURE DRINK PER SURGEON ORDER  WHICH NEEDS TO BE COMPLETED AT: 4:30 AM .  CLEAR LIQUID DIET   Foods Allowed                                                                     Foods Excluded  Coffee and tea, regular and decaf                             liquids that you cannot  Plain Jell-O any favor except red or purple                                           see through such as: Fruit ices (not with fruit pulp)                                     milk, soups, orange juice  Iced Popsicles                                    All solid food Carbonated beverages, regular and diet                                    Cranberry, grape and apple juices Sports drinks like Gatorade Lightly seasoned clear broth or consume(fat free) Sugar, honey syrup  Sample Menu Breakfast                                Lunch  Supper Cranberry juice                    Beef broth                             Chicken broth Jell-O                                     Grape juice                           Apple juice Coffee or tea                        Jell-O                                      Popsicle                                                Coffee or tea                        Coffee or tea  _____________________________________________________________________   BRUSH YOUR TEETH MORNING OF SURGERY AND RINSE YOUR MOUTH OUT, NO CHEWING GUM CANDY OR MINTS.     Take these medicines the morning of surgery with A SIP OF WATER: Synthroid.                               You may not have any metal on your body including hair pins and              piercings  Do not wear jewelry, make-up, lotions, powders or perfumes, deodorant             Do not wear nail polish on your fingernails.  Do not shave  48 hours prior to surgery.             Do not bring valuables to the hospital. [place].  Contacts, dentures or bridgework may not be worn into surgery.  Leave suitcase in the car. After surgery it may be brought to your room.     Patients discharged the day of surgery will not be allowed to drive home. IF YOU ARE HAVING SURGERY AND GOING HOME THE SAME DAY, YOU MUST HAVE AN ADULT TO DRIVE YOU HOME AND BE WITH YOU FOR 24 HOURS. YOU MAY GO HOME BY TAXI OR UBER OR ORTHERWISE, BUT AN ADULT MUST ACCOMPANY YOU HOME AND STAY WITH YOU FOR 24 HOURS.  Name and phone number of your driver:  Special Instructions: N/A              Please read over the following fact sheets you were given: _____________________________________________________________________          [facility] - Preparing for Surgery Before surgery, you can play an important role.  Because skin is not sterile, your skin needs  to be as free of germs as possible.  You can reduce the number of germs on your skin by washing with CHG (chlorahexidine gluconate) soap before surgery.  CHG is an  antiseptic cleaner which kills germs and bonds with the skin to continue killing germs even after washing. Please DO NOT use if you have an allergy to CHG or antibacterial soaps.  If your skin becomes reddened/irritated stop using the CHG and inform your nurse when you arrive at Short Stay. Do not shave (including legs and underarms) for at least 48 hours prior to the first CHG shower.  You may shave your face/neck. Please follow these instructions carefully:  1.  Shower with CHG Soap the night before surgery and the  morning of Surgery.  2.  If you choose to wash your hair, wash your hair first as usual with your  normal  shampoo.  3.  After you shampoo, rinse your hair and body thoroughly to remove the  shampoo.                           4.  Use CHG as you would any other liquid soap.  You can apply chg directly  to the skin and wash                       Gently with a scrungie or clean washcloth.  5.  Apply the CHG Soap to your body ONLY FROM THE NECK DOWN.   Do not use on face/ open                           Wound or open sores. Avoid contact with eyes, ears mouth and genitals (private parts).                       Wash face,  Genitals (private parts) with your normal soap.             6.  Wash thoroughly, paying special attention to the area where your surgery  will be performed.  7.  Thoroughly rinse your body with warm water from the neck down.  8.  DO NOT shower/wash with your normal soap after using and rinsing off  the CHG Soap.                9.  Pat yourself dry with a clean towel.            10.  Wear clean pajamas.            11.  Place clean sheets on your bed the night of your first shower and do not  sleep with pets. Day of Surgery : Do not apply any lotions/deodorants the morning of surgery.  Please wear clean clothes to the hospital/surgery center.  FAILURE TO FOLLOW THESE INSTRUCTIONS MAY RESULT IN THE CANCELLATION OF YOUR SURGERY PATIENT  SIGNATURE_________________________________  NURSE SIGNATURE__________________________________  ________________________________________________________________________   Adam Phenix  An incentive spirometer is a tool that can help keep your lungs clear and active. This tool measures how well you are filling your lungs with each breath. Taking long deep breaths may help reverse or decrease the chance of developing breathing (pulmonary) problems (especially infection) following:  A long period of time when you are unable to move or be active. BEFORE THE PROCEDURE   If the spirometer includes an indicator to show  your best effort, your nurse or respiratory therapist will set it to a desired goal.  If possible, sit up straight or lean slightly forward. Try not to slouch.  Hold the incentive spirometer in an upright position. INSTRUCTIONS FOR USE  1. Sit on the edge of your bed if possible, or sit up as far as you can in bed or on a chair. 2. Hold the incentive spirometer in an upright position. 3. Breathe out normally. 4. Place the mouthpiece in your mouth and seal your lips tightly around it. 5. Breathe in slowly and as deeply as possible, raising the piston or the ball toward the top of the column. 6. Hold your breath for 3-5 seconds or for as long as possible. Allow the piston or ball to fall to the bottom of the column. 7. Remove the mouthpiece from your mouth and breathe out normally. 8. Rest for a few seconds and repeat Steps 1 through 7 at least 10 times every 1-2 hours when you are awake. Take your time and take a few normal breaths between deep breaths. 9. The spirometer may include an indicator to show your best effort. Use the indicator as a goal to work toward during each repetition. 10. After each set of 10 deep breaths, practice coughing to be sure your lungs are clear. If you have an incision (the cut made at the time of surgery), support your incision when coughing  by placing a pillow or rolled up towels firmly against it. Once you are able to get out of bed, walk around indoors and cough well. You may stop using the incentive spirometer when instructed by your caregiver.  RISKS AND COMPLICATIONS  Take your time so you do not get dizzy or light-headed.  If you are in pain, you may need to take or ask for pain medication before doing incentive spirometry. It is harder to take a deep breath if you are having pain. AFTER USE  Rest and breathe slowly and easily.  It can be helpful to keep track of a log of your progress. Your caregiver can provide you with a simple table to help with this. If you are using the spirometer at home, follow these instructions: [place] IF:   You are having difficultly using the spirometer.  You have trouble using the spirometer as often as instructed.  Your pain medication is not giving enough relief while using the spirometer.  You develop fever of 100.5 F (38.1 C) or higher. SEEK IMMEDIATE MEDICAL CARE IF:   You cough up bloody sputum that had not been present before.  You develop fever of 102 F (38.9 C) or greater.  You develop worsening pain at or near the incision site. MAKE SURE YOU:   Understand these instructions.  Will watch your condition.  Will get help right away if you are not doing well or get worse. Document Released: 02/02/2007 Document Revised: 12/15/2011 Document Reviewed: 04/05/2007 [facility] Patient Information 2014 [place], [place].   ________________________________________________________________________

## 2020-07-20 NOTE — Care Plan (Signed)
Ortho Bundle Case Management Note  Patient Details  Name: Veronica Price MRN: [id number] Date of Birth: 12/19/1945     Spoke with patient prior to surgery in the office. She plans to discharge to home with family. HHPT referral to [facility] in [place]. OPPT set up to follow. Rolling walker and CPM ordered for home. Patient and MD in agreement with plan. Choice offered.                 DME Arranged:  Gilford Rile rolling, CPM DME Agency:  [facility]  HH Arranged:  PT [place] Agency:  [facility]  Additional Comments: Please contact me with any questions of if this plan should need to change.  Ladell Heads,  [facility]  [phone] 07/20/2020, 9:36 AM

## 2020-07-23 ENCOUNTER — Encounter (HOSPITAL_COMMUNITY): Payer: Self-pay

## 2020-07-23 ENCOUNTER — Encounter (HOSPITAL_COMMUNITY)
Admission: RE | Admit: 2020-07-23 | Discharge: 2020-07-23 | Disposition: A | Discharge status: Home / Self Care | Payer: Medicare Other | Source: Ambulatory Visit | Attending: Orthopedic Surgery | Admitting: Orthopedic Surgery

## 2020-07-23 ENCOUNTER — Other Ambulatory Visit: Payer: Self-pay

## 2020-07-23 DIAGNOSIS — Z01818 Encounter for other preprocedural examination: Secondary | ICD-10-CM | POA: Insufficient documentation

## 2020-07-23 LAB — COMPREHENSIVE METABOLIC PANEL
ALT: 15 U/L (ref 0–44)
AST: 18 U/L (ref 15–41)
Albumin: 4.3 g/dL (ref 3.5–5.0)
Alkaline Phosphatase: 67 U/L (ref 38–126)
Anion gap: 8 (ref 5–15)
BUN: 14 mg/dL (ref 8–23)
CO2: 29 mmol/L (ref 22–32)
Calcium: 9 mg/dL (ref 8.9–10.3)
Chloride: 104 mmol/L (ref 98–111)
Creatinine, Ser: 1.03 mg/dL — ABNORMAL HIGH (ref 0.44–1.00)
GFR, Estimated: 54 mL/min — ABNORMAL LOW (ref >60)
Glucose, Bld: 88 mg/dL (ref 70–99)
Potassium: 3.8 mmol/L (ref 3.5–5.1)
Sodium: 141 mmol/L (ref 135–145)
Total Bilirubin: 0.8 mg/dL (ref 0.3–1.2)
Total Protein: 7.6 g/dL (ref 6.5–8.1)

## 2020-07-23 LAB — CBC WITH DIFFERENTIAL/PLATELET
Abs Immature Granulocytes: 0.02 10*3/uL (ref 0.00–0.07)
Basophils Absolute: 0 10*3/uL (ref 0.0–0.1)
Basophils Relative: 1 %
Eosinophils Absolute: 0.1 10*3/uL (ref 0.0–0.5)
Eosinophils Relative: 1 %
HCT: 41.5 % (ref 36.0–46.0)
Hemoglobin: 13.4 g/dL (ref 12.0–15.0)
Immature Granulocytes: 0 %
Lymphocytes Relative: 20 %
Lymphs Abs: 1.2 10*3/uL (ref 0.7–4.0)
MCH: 32.7 pg (ref 26.0–34.0)
MCHC: 32.3 g/dL (ref 30.0–36.0)
MCV: 101.2 fL — ABNORMAL HIGH (ref 80.0–100.0)
Monocytes Absolute: 0.7 10*3/uL (ref 0.1–1.0)
Monocytes Relative: 12 %
Neutro Abs: 3.9 10*3/uL (ref 1.7–7.7)
Neutrophils Relative %: 66 %
Platelets: 221 10*3/uL (ref 150–400)
RBC: 4.1 MIL/uL (ref 3.87–5.11)
RDW: 13.3 % (ref 11.5–15.5)
WBC: 5.9 10*3/uL (ref 4.0–10.5)
nRBC: 0 % (ref 0.0–0.2)

## 2020-07-23 LAB — URINALYSIS, ROUTINE W REFLEX MICROSCOPIC
Bacteria, UA: NONE SEEN
Bilirubin Urine: NEGATIVE
Glucose, UA: NEGATIVE mg/dL
Ketones, ur: NEGATIVE mg/dL
Nitrite: NEGATIVE
Protein, ur: NEGATIVE mg/dL
Specific Gravity, Urine: 1.018 (ref 1.005–1.030)
pH: 5 (ref 5.0–8.0)

## 2020-07-23 LAB — APTT: aPTT: 26 seconds (ref 24–36)

## 2020-07-23 LAB — PROTIME-INR
INR: 0.9 (ref 0.8–1.2)
Prothrombin Time: 12.1 seconds (ref 11.4–15.2)

## 2020-07-23 NOTE — Progress Notes (Signed)
COVID Vaccine Completed: Yes Date COVID Vaccine completed: 12/05/19 COVID vaccine manufacturer:     Moderna     PCP - Enid Derry. NP. LOV: 05/08/20 Clearance: 07/03/20 Chart Cardiologist - No  Chest x-ray -  EKG -  Stress Test -  ECHO - 2019 Cardiac Cath -  Pacemaker/ICD device last checked:  Sleep Study -  CPAP -   Fasting Blood Sugar -  Checks Blood Sugar _____ times a day  Blood Thinner Instructions: Aspirin Instructions: Last Dose:  Anesthesia review:   Patient denies shortness of breath, fever, cough and chest pain at PAT appointment   Patient verbalized understanding of instructions that were given to them at the PAT appointment. Patient was also instructed that they will need to review over the PAT instructions again at home before surgery.

## 2020-07-24 LAB — TYPE AND SCREEN
ABO/RH(D): A POS
Antibody Screen: NEGATIVE

## 2020-07-24 LAB — URINE CULTURE: Culture: 10000 — AB

## 2020-07-24 NOTE — Progress Notes (Signed)
Lab. Results: Urine analisis: Moderate leukocytes.

## 2020-07-31 ENCOUNTER — Other Ambulatory Visit (HOSPITAL_COMMUNITY)
Admission: RE | Admit: 2020-07-31 | Discharge: 2020-07-31 | Disposition: A | Discharge status: Home / Self Care | Payer: Medicare Other | Source: Ambulatory Visit | Attending: Orthopedic Surgery | Admitting: Orthopedic Surgery

## 2020-07-31 DIAGNOSIS — Z01812 Encounter for preprocedural laboratory examination: Secondary | ICD-10-CM | POA: Diagnosis not present

## 2020-07-31 DIAGNOSIS — Z20822 Contact with and (suspected) exposure to covid-19: Secondary | ICD-10-CM | POA: Insufficient documentation

## 2020-07-31 LAB — SARS CORONAVIRUS 2 (TAT 6-24 HRS): SARS Coronavirus 2: NEGATIVE

## 2020-08-02 MED ORDER — BUPIVACAINE LIPOSOME 1.3 % IJ SUSP
20.0000 mL | Freq: Once | INTRAMUSCULAR | Status: DC
  Filled 2020-08-02: qty 20

## 2020-08-02 MED ORDER — TRANEXAMIC ACID 1000 MG/10ML IV SOLN
2000.0000 mg | INTRAVENOUS | Status: DC
  Filled 2020-08-02: qty 20

## 2020-08-02 NOTE — Anesthesia Preprocedure Evaluation (Addendum)
Anesthesia Evaluation  Patient identified by MRN, date of birth, ID band Patient awake    Reviewed: reviewed documented beta blocker date and time   Airway Mallampati: II  TM Distance: >3 FB Neck ROM: Full    Dental  (+) Upper Dentures, Lower Dentures   Pulmonary neg pulmonary ROS,    Pulmonary exam normal        Cardiovascular negative cardio ROS   Rhythm:Regular Rate:Normal     Neuro/Psych Depression    GI/Hepatic Neg liver ROS, GERD  Controlled,  Endo/Other  Hypothyroidism   Renal/GU negative Renal ROS  negative genitourinary   Musculoskeletal  (+) Arthritis , Right breast cancer 2019   Abdominal (+)  Abdomen: soft. Bowel sounds: normal.  Peds  Hematology negative hematology ROS (+)   Anesthesia Other Findings   Reproductive/Obstetrics                            Anesthesia Physical Anesthesia Plan  ASA: II  Anesthesia Plan: Spinal, MAC and Regional   Post-op Pain Management:  Regional for Post-op pain   Induction:   PONV Risk Score and Plan: 2 and Ondansetron, Dexamethasone, Propofol infusion and Treatment may vary due to age or medical condition  Airway Management Planned: Simple Face Mask and Nasal Cannula  Additional Equipment: None  Intra-op Plan:   Post-operative Plan:   Informed Consent: I have reviewed the patients History and Physical, chart, labs and discussed the procedure including the risks, benefits and alternatives for the proposed anesthesia with the patient or authorized representative who has indicated his/her understanding and acceptance.     Dental advisory given  Plan Discussed with: CRNA  Anesthesia Plan Comments: (Lab Results      Component                Value               Date                      WBC                      5.9                 07/23/2020                HGB                      13.4                07/23/2020                 HCT                      41.5                07/23/2020                MCV                      101.2 (H)           07/23/2020                PLT                      221  07/23/2020          )      Anesthesia Quick Evaluation

## 2020-08-03 ENCOUNTER — Encounter (HOSPITAL_COMMUNITY): Payer: Self-pay | Admitting: Orthopedic Surgery

## 2020-08-03 ENCOUNTER — Ambulatory Visit (HOSPITAL_COMMUNITY): Payer: Medicare Other | Admitting: Anesthesiology

## 2020-08-03 ENCOUNTER — Encounter (HOSPITAL_COMMUNITY)
Admission: RE | Disposition: A | Discharge status: Home / Self Care | Payer: Self-pay | Source: Ambulatory Visit | Attending: Orthopedic Surgery

## 2020-08-03 ENCOUNTER — Ambulatory Visit (HOSPITAL_COMMUNITY)
Admission: RE | Admit: 2020-08-03 | Discharge: 2020-08-03 | Disposition: A | Discharge status: Home / Self Care | Payer: Medicare Other | Source: Ambulatory Visit | Attending: Orthopedic Surgery | Admitting: Orthopedic Surgery

## 2020-08-03 DIAGNOSIS — R262 Difficulty in walking, not elsewhere classified: Secondary | ICD-10-CM | POA: Insufficient documentation

## 2020-08-03 DIAGNOSIS — G8918 Other acute postprocedural pain: Secondary | ICD-10-CM | POA: Diagnosis not present

## 2020-08-03 DIAGNOSIS — M6281 Muscle weakness (generalized): Secondary | ICD-10-CM | POA: Insufficient documentation

## 2020-08-03 DIAGNOSIS — Z79899 Other long term (current) drug therapy: Secondary | ICD-10-CM | POA: Diagnosis not present

## 2020-08-03 DIAGNOSIS — M21161 Varus deformity, not elsewhere classified, right knee: Secondary | ICD-10-CM | POA: Diagnosis not present

## 2020-08-03 DIAGNOSIS — Z888 Allergy status to other drugs, medicaments and biological substances status: Secondary | ICD-10-CM | POA: Insufficient documentation

## 2020-08-03 DIAGNOSIS — M1711 Unilateral primary osteoarthritis, right knee: Secondary | ICD-10-CM | POA: Insufficient documentation

## 2020-08-03 DIAGNOSIS — C50411 Malignant neoplasm of upper-outer quadrant of right female breast: Secondary | ICD-10-CM | POA: Diagnosis not present

## 2020-08-03 DIAGNOSIS — K219 Gastro-esophageal reflux disease without esophagitis: Secondary | ICD-10-CM | POA: Diagnosis not present

## 2020-08-03 DIAGNOSIS — E039 Hypothyroidism, unspecified: Secondary | ICD-10-CM | POA: Diagnosis not present

## 2020-08-03 HISTORY — PX: TOTAL KNEE ARTHROPLASTY: SHX125

## 2020-08-03 LAB — ABO/RH: ABO/RH(D): A POS

## 2020-08-03 SURGERY — ARTHROPLASTY, KNEE, TOTAL
Anesthesia: Monitor Anesthesia Care | Site: Knee | Laterality: Right

## 2020-08-03 MED ORDER — SODIUM CHLORIDE (PF) 0.9 % IJ SOLN
INTRAMUSCULAR | Status: AC
  Filled 2020-08-03: qty 50

## 2020-08-03 MED ORDER — DEXAMETHASONE SODIUM PHOSPHATE 4 MG/ML IJ SOLN
INTRAMUSCULAR | Status: DC | PRN
  Administered 2020-08-03: 4 mg via INTRAVENOUS

## 2020-08-03 MED ORDER — TRANEXAMIC ACID 1000 MG/10ML IV SOLN
INTRAVENOUS | Status: DC | PRN
  Administered 2020-08-03: 2000 mg via TOPICAL

## 2020-08-03 MED ORDER — PHENYLEPHRINE 40 MCG/ML (10ML) SYRINGE FOR IV PUSH (FOR BLOOD PRESSURE SUPPORT)
PREFILLED_SYRINGE | INTRAVENOUS | Status: DC | PRN
  Administered 2020-08-03 (×2): 40 ug via INTRAVENOUS

## 2020-08-03 MED ORDER — DEXAMETHASONE SODIUM PHOSPHATE 10 MG/ML IJ SOLN
INTRAMUSCULAR | Status: AC
  Filled 2020-08-03: qty 1

## 2020-08-03 MED ORDER — OXYCODONE HCL 5 MG PO TABS
ORAL_TABLET | ORAL | 0 refills | Status: DC
Start: 2020-08-03 — End: 2020-10-23

## 2020-08-03 MED ORDER — CEFAZOLIN SODIUM-DEXTROSE 2-4 GM/100ML-% IV SOLN
INTRAVENOUS | Status: AC
  Filled 2020-08-03: qty 100

## 2020-08-03 MED ORDER — TRANEXAMIC ACID-NACL 1000-0.7 MG/100ML-% IV SOLN
INTRAVENOUS | Status: AC
  Filled 2020-08-03: qty 100

## 2020-08-03 MED ORDER — ONDANSETRON HCL 4 MG/2ML IJ SOLN: 4.0000 mg | Freq: Four times a day (QID) | INTRAMUSCULAR | Status: DC | PRN

## 2020-08-03 MED ORDER — PROPOFOL 10 MG/ML IV BOLUS
INTRAVENOUS | Status: AC
  Filled 2020-08-03: qty 20

## 2020-08-03 MED ORDER — CEFAZOLIN SODIUM-DEXTROSE 1-4 GM/50ML-% IV SOLN
INTRAVENOUS | Status: AC
  Filled 2020-08-03: qty 50

## 2020-08-03 MED ORDER — CEFAZOLIN SODIUM-DEXTROSE 2-4 GM/100ML-% IV SOLN
2.0000 g | INTRAVENOUS | Status: AC
  Administered 2020-08-03: 2 g via INTRAVENOUS

## 2020-08-03 MED ORDER — FENTANYL CITRATE (PF) 100 MCG/2ML IJ SOLN
INTRAMUSCULAR | Status: DC | PRN
  Administered 2020-08-03 (×2): 50 ug via INTRAVENOUS

## 2020-08-03 MED ORDER — WATER FOR IRRIGATION, STERILE IR SOLN
Status: DC | PRN
  Administered 2020-08-03: 2000 mL

## 2020-08-03 MED ORDER — TRANEXAMIC ACID-NACL 1000-0.7 MG/100ML-% IV SOLN
1000.0000 mg | INTRAVENOUS | Status: AC
  Administered 2020-08-03: 1000 mg via INTRAVENOUS

## 2020-08-03 MED ORDER — BUPIVACAINE-EPINEPHRINE (PF) 0.25% -1:200000 IJ SOLN
INTRAMUSCULAR | Status: AC
  Filled 2020-08-03: qty 30

## 2020-08-03 MED ORDER — CHLORHEXIDINE GLUCONATE 0.12 % MT SOLN
15.0000 mL | Freq: Once | OROMUCOSAL | Status: AC
  Administered 2020-08-03: 15 mL via OROMUCOSAL

## 2020-08-03 MED ORDER — ONDANSETRON HCL 4 MG/2ML IJ SOLN: 4.0000 mg | Freq: Once | INTRAMUSCULAR | Status: DC | PRN

## 2020-08-03 MED ORDER — ACETAMINOPHEN 325 MG PO TABS: 325.0000 mg | ORAL_TABLET | Freq: Four times a day (QID) | ORAL | Status: DC | PRN

## 2020-08-03 MED ORDER — ACETAMINOPHEN 325 MG PO TABS: 650.0000 mg | ORAL_TABLET | ORAL | 2 refills | Status: DC | PRN

## 2020-08-03 MED ORDER — ORAL CARE MOUTH RINSE: 15.0000 mL | Freq: Once | OROMUCOSAL | Status: AC

## 2020-08-03 MED ORDER — SODIUM CHLORIDE 0.9 % IR SOLN
Status: DC | PRN
  Administered 2020-08-03: 1000 mL

## 2020-08-03 MED ORDER — OXYCODONE HCL 5 MG PO TABS
5.0000 mg | ORAL_TABLET | ORAL | Status: DC | PRN
  Administered 2020-08-03: 5 mg via ORAL

## 2020-08-03 MED ORDER — METOCLOPRAMIDE HCL 5 MG/ML IJ SOLN: 5.0000 mg | Freq: Three times a day (TID) | INTRAMUSCULAR | Status: DC | PRN

## 2020-08-03 MED ORDER — METOCLOPRAMIDE HCL 5 MG PO TABS
5.0000 mg | ORAL_TABLET | Freq: Three times a day (TID) | ORAL | Status: DC | PRN
  Filled 2020-08-03: qty 2

## 2020-08-03 MED ORDER — FENTANYL CITRATE (PF) 100 MCG/2ML IJ SOLN
INTRAMUSCULAR | Status: AC
  Filled 2020-08-03: qty 2

## 2020-08-03 MED ORDER — LACTATED RINGERS IV BOLUS
250.0000 mL | Freq: Once | INTRAVENOUS | Status: AC
  Administered 2020-08-03: 250 mL via INTRAVENOUS

## 2020-08-03 MED ORDER — ONDANSETRON HCL 4 MG PO TABS
4.0000 mg | ORAL_TABLET | Freq: Four times a day (QID) | ORAL | Status: DC | PRN
  Filled 2020-08-03: qty 1

## 2020-08-03 MED ORDER — SODIUM CHLORIDE 0.9% FLUSH
INTRAVENOUS | Status: DC | PRN
  Administered 2020-08-03: 50 mL

## 2020-08-03 MED ORDER — ASPIRIN EC 81 MG PO TBEC: 81.0000 mg | DELAYED_RELEASE_TABLET | Freq: Two times a day (BID) | ORAL | 0 refills | Status: AC

## 2020-08-03 MED ORDER — ONDANSETRON HCL 4 MG/2ML IJ SOLN
INTRAMUSCULAR | Status: AC
  Filled 2020-08-03: qty 2

## 2020-08-03 MED ORDER — LACTATED RINGERS IV BOLUS
500.0000 mL | Freq: Once | INTRAVENOUS | Status: AC
  Administered 2020-08-03: 500 mL via INTRAVENOUS

## 2020-08-03 MED ORDER — ONDANSETRON HCL 4 MG/2ML IJ SOLN
INTRAMUSCULAR | Status: DC | PRN
  Administered 2020-08-03: 4 mg via INTRAVENOUS

## 2020-08-03 MED ORDER — 0.9 % SODIUM CHLORIDE (POUR BTL) OPTIME
TOPICAL | Status: DC | PRN
  Administered 2020-08-03: 1000 mL

## 2020-08-03 MED ORDER — PHENYLEPHRINE HCL (PRESSORS) 10 MG/ML IV SOLN
INTRAVENOUS | Status: AC
  Filled 2020-08-03: qty 1

## 2020-08-03 MED ORDER — ACETAMINOPHEN 500 MG PO TABS: 1000.0000 mg | ORAL_TABLET | Freq: Once | ORAL | Status: AC

## 2020-08-03 MED ORDER — PROPOFOL 500 MG/50ML IV EMUL
INTRAVENOUS | Status: DC | PRN
  Administered 2020-08-03: 50 ug/kg/min via INTRAVENOUS

## 2020-08-03 MED ORDER — DOCUSATE SODIUM 100 MG PO CAPS: 100.0000 mg | ORAL_CAPSULE | Freq: Every day | ORAL | 2 refills | Status: DC | PRN

## 2020-08-03 MED ORDER — OXYCODONE HCL 5 MG PO TABS
ORAL_TABLET | ORAL | Status: AC
  Filled 2020-08-03: qty 1

## 2020-08-03 MED ORDER — BUPIVACAINE IN DEXTROSE 0.75-8.25 % IT SOLN
INTRATHECAL | Status: DC | PRN
  Administered 2020-08-03: 1.8 mL via INTRATHECAL

## 2020-08-03 MED ORDER — LACTATED RINGERS IV SOLN: INTRAVENOUS | Status: DC

## 2020-08-03 MED ORDER — ROPIVACAINE HCL 5 MG/ML IJ SOLN
INTRAMUSCULAR | Status: DC | PRN
  Administered 2020-08-03: 25 mL via PERINEURAL

## 2020-08-03 MED ORDER — POVIDONE-IODINE 10 % EX SWAB
2.0000 "application " | Freq: Once | CUTANEOUS | Status: AC
  Administered 2020-08-03: 2 via TOPICAL

## 2020-08-03 MED ORDER — OXYCODONE HCL 5 MG PO TABS: 5.0000 mg | ORAL_TABLET | Freq: Once | ORAL | Status: DC | PRN

## 2020-08-03 MED ORDER — MIDAZOLAM HCL 2 MG/2ML IJ SOLN
INTRAMUSCULAR | Status: AC
  Filled 2020-08-03: qty 2

## 2020-08-03 MED ORDER — CEFAZOLIN SODIUM-DEXTROSE 1-4 GM/50ML-% IV SOLN
1.0000 g | Freq: Four times a day (QID) | INTRAVENOUS | Status: DC
  Administered 2020-08-03: 1 g via INTRAVENOUS

## 2020-08-03 MED ORDER — TRANEXAMIC ACID-NACL 1000-0.7 MG/100ML-% IV SOLN
1000.0000 mg | Freq: Once | INTRAVENOUS | Status: AC
  Administered 2020-08-03: 1000 mg via INTRAVENOUS

## 2020-08-03 MED ORDER — HYDROMORPHONE HCL 1 MG/ML IJ SOLN: 0.2500 mg | INTRAMUSCULAR | Status: DC | PRN

## 2020-08-03 MED ORDER — OXYCODONE HCL 5 MG/5ML PO SOLN: 5.0000 mg | Freq: Once | ORAL | Status: DC | PRN

## 2020-08-03 MED ORDER — BUPIVACAINE-EPINEPHRINE (PF) 0.25% -1:200000 IJ SOLN
INTRAMUSCULAR | Status: DC | PRN
  Administered 2020-08-03: 30 mL

## 2020-08-03 MED ORDER — SODIUM CHLORIDE 0.9 % IV SOLN: INTRAVENOUS | Status: DC

## 2020-08-03 MED ORDER — HYDROMORPHONE HCL 1 MG/ML IJ SOLN: 0.5000 mg | INTRAMUSCULAR | Status: DC | PRN

## 2020-08-03 MED ORDER — ACETAMINOPHEN 500 MG PO TABS
ORAL_TABLET | ORAL | Status: AC
  Administered 2020-08-03: 1000 mg via ORAL
  Filled 2020-08-03: qty 2

## 2020-08-03 MED ORDER — BUPIVACAINE LIPOSOME 1.3 % IJ SUSP
INTRAMUSCULAR | Status: DC | PRN
  Administered 2020-08-03: 20 mL

## 2020-08-03 MED ORDER — MIDAZOLAM HCL 5 MG/5ML IJ SOLN
INTRAMUSCULAR | Status: DC | PRN
  Administered 2020-08-03 (×2): 1 mg via INTRAVENOUS

## 2020-08-03 SURGICAL SUPPLY — 67 items
ATTUNE PS FEM RT SZ 4 CEM KNEE (Femur) ×3 IMPLANT
ATTUNE PSRP INSR SZ4 7 KNEE (Insert) ×2 IMPLANT
ATTUNE PSRP INSR SZ4 7MM KNEE (Insert) ×1 IMPLANT
BAG DECANTER FOR FLEXI CONT (MISCELLANEOUS) ×3 IMPLANT
BAG ZIPLOCK 12X15 (MISCELLANEOUS) ×3 IMPLANT
BASE TIBIAL ROT PLAT SZ 5 KNEE (Knees) ×1 IMPLANT
BENZOIN TINCTURE PRP APPL 2/3 (GAUZE/BANDAGES/DRESSINGS) ×3 IMPLANT
BLADE SAGITTAL 25.0X1.19X90 (BLADE) ×2 IMPLANT
BLADE SAGITTAL 25.0X1.19X90MM (BLADE) ×1
BLADE SAW SGTL 13X75X1.27 (BLADE) ×3 IMPLANT
BLADE SURG 15 STRL LF DISP TIS (BLADE) ×1 IMPLANT
BLADE SURG 15 STRL SS (BLADE) ×3
BLADE SURG SZ10 CARB STEEL (BLADE) ×3 IMPLANT
BNDG ELASTIC 6X15 VLCR STRL LF (GAUZE/BANDAGES/DRESSINGS) ×3 IMPLANT
BOWL SMART MIX CTS (DISPOSABLE) ×3 IMPLANT
CEMENT HV SMART SET (Cement) ×6 IMPLANT
CLOSURE STERI-STRIP 1/2X4 (GAUZE/BANDAGES/DRESSINGS) ×2
CLOSURE WOUND 1/2 X4 (GAUZE/BANDAGES/DRESSINGS) ×2
CLSR STERI-STRIP ANTIMIC 1/2X4 (GAUZE/BANDAGES/DRESSINGS) ×4 IMPLANT
COVER SURGICAL LIGHT HANDLE (MISCELLANEOUS) ×3 IMPLANT
COVER WAND RF STERILE (DRAPES) ×3 IMPLANT
CUFF TOURN SGL QUICK 34 (TOURNIQUET CUFF) ×3
CUFF TRNQT CYL 34X4.125X (TOURNIQUET CUFF) ×1 IMPLANT
DECANTER SPIKE VIAL GLASS SM (MISCELLANEOUS) ×6 IMPLANT
DRAPE INCISE IOBAN 66X45 STRL (DRAPES) IMPLANT
DRAPE ORTHO SPLIT 77X108 STRL (DRAPES) ×6
DRAPE SURG ORHT 6 SPLT 77X108 (DRAPES) ×2 IMPLANT
DRAPE U-SHAPE 47X51 STRL (DRAPES) ×3 IMPLANT
DRESSING AQUACEL AG SP 3.5X10 (GAUZE/BANDAGES/DRESSINGS) ×1 IMPLANT
DRSG AQUACEL AG ADV 3.5X10 (GAUZE/BANDAGES/DRESSINGS) ×3 IMPLANT
DRSG AQUACEL AG SP 3.5X10 (GAUZE/BANDAGES/DRESSINGS) ×3
DURAPREP 26ML APPLICATOR (WOUND CARE) ×6 IMPLANT
ELECT REM PT RETURN 15FT ADLT (MISCELLANEOUS) ×3 IMPLANT
GLOVE BIOGEL PI IND STRL 8 (GLOVE) ×2 IMPLANT
GLOVE BIOGEL PI INDICATOR 8 (GLOVE) ×4
GLOVE SURG ORTHO 8.0 STRL STRW (GLOVE) ×3 IMPLANT
GLOVE SURG SS PI 7.5 STRL IVOR (GLOVE) ×3 IMPLANT
GOWN STRL REUS W/TWL XL LVL3 (GOWN DISPOSABLE) ×6 IMPLANT
HANDPIECE INTERPULSE COAX TIP (DISPOSABLE) ×3
HOLDER FOLEY CATH W/STRAP (MISCELLANEOUS) IMPLANT
HOOD PEEL AWAY FLYTE STAYCOOL (MISCELLANEOUS) ×3 IMPLANT
IMMOBILIZER KNEE 20 (SOFTGOODS) ×3
IMMOBILIZER KNEE 20 THIGH 36 (SOFTGOODS) ×1 IMPLANT
KIT TURNOVER KIT A (KITS) IMPLANT
MANIFOLD NEPTUNE II (INSTRUMENTS) ×3 IMPLANT
NEEDLE HYPO 22GX1.5 SAFETY (NEEDLE) ×6 IMPLANT
NS IRRIG 1000ML POUR BTL (IV SOLUTION) ×3 IMPLANT
PACK ICE MAXI GEL EZY WRAP (MISCELLANEOUS) ×3 IMPLANT
PACK TOTAL KNEE CUSTOM (KITS) ×3 IMPLANT
PATELLA MEDIAL ATTUN 35MM KNEE (Knees) ×3 IMPLANT
PENCIL SMOKE EVACUATOR (MISCELLANEOUS) IMPLANT
PIN DRILL FIX HALF THREAD (BIT) ×3 IMPLANT
PIN STEINMAN FIXATION KNEE (PIN) ×3 IMPLANT
PROTECTOR NERVE ULNAR (MISCELLANEOUS) ×3 IMPLANT
SET HNDPC FAN SPRY TIP SCT (DISPOSABLE) ×1 IMPLANT
STRIP CLOSURE SKIN 1/2X4 (GAUZE/BANDAGES/DRESSINGS) ×4 IMPLANT
SUT ETHIBOND NAB CT1 #1 30IN (SUTURE) ×6 IMPLANT
SUT MNCRL AB 3-0 PS2 18 (SUTURE) ×3 IMPLANT
SUT VIC AB 0 CT1 36 (SUTURE) ×3 IMPLANT
SUT VIC AB 2-0 CT1 27 (SUTURE) ×6
SUT VIC AB 2-0 CT1 TAPERPNT 27 (SUTURE) ×2 IMPLANT
SYR CONTROL 10ML LL (SYRINGE) ×9 IMPLANT
TIBIAL BASE ROT PLAT SZ 5 KNEE (Knees) ×3 IMPLANT
TOWEL OR 17X26 10 PK STRL BLUE (TOWEL DISPOSABLE) ×3 IMPLANT
TRAY FOLEY MTR SLVR 16FR STAT (SET/KITS/TRAYS/PACK) ×3 IMPLANT
WATER STERILE IRR 1000ML POUR (IV SOLUTION) ×6 IMPLANT
WRAP KNEE MAXI GEL POST OP (GAUZE/BANDAGES/DRESSINGS) ×3 IMPLANT

## 2020-08-03 NOTE — Interval H&P Note (Signed)
History and Physical Interval Note:  08/03/2020 7:39 AM  Veronica Price  has presented today for surgery, with the diagnosis of OA RIGHT KNEE.  The various methods of treatment have been discussed with the patient and family. After consideration of risks, benefits and other options for treatment, the patient has consented to  Procedure(s): TOTAL KNEE ARTHROPLASTY (Right) as a surgical intervention.  The patient's history has been reviewed, patient examined, no change in status, stable for surgery.  I have reviewed the patient's chart and labs.  Questions were answered to the patient's satisfaction.     Yvette Rack

## 2020-08-03 NOTE — Discharge Instructions (Signed)

## 2020-08-03 NOTE — Brief Op Note (Signed)
08/03/2020  10:24 AM  PATIENT:  Veronica Price  74 y.o. female  PRE-OPERATIVE DIAGNOSIS:  OA RIGHT KNEE  POST-OPERATIVE DIAGNOSIS:  OA RIGHT KNEE  PROCEDURE:  Procedure(s): TOTAL KNEE ARTHROPLASTY (Right)  SURGEON:  Surgeon(s) and Role:    * Earlie Server, MD - Primary  PHYSICIAN ASSISTANT: Chriss Czar, PA-C  ASSISTANTS: OR staff x1   ANESTHESIA:   local, regional, spinal and IV sedation  EBL:  50 mL   BLOOD ADMINISTERED:none  DRAINS: none   LOCAL MEDICATIONS USED:  MARCAINE     SPECIMEN:  No Specimen  DISPOSITION OF SPECIMEN:  N/A  COUNTS:  YES  TOURNIQUET:   Total Tourniquet Time Documented: Thigh (Right) - 58 minutes Total: Thigh (Right) - 58 minutes   DICTATION: .Other Dictation: Dictation Number unknown  PLAN OF CARE: Discharge to home after PACU  PATIENT DISPOSITION:  PACU - hemodynamically stable.   Delay start of Pharmacological VTE agent (>24hrs) due to surgical blood loss or risk of bleeding: yes

## 2020-08-03 NOTE — Anesthesia Procedure Notes (Signed)
Anesthesia Regional Block: Adductor canal block   Pre-Anesthetic Checklist: ,, timeout performed, Correct Patient, Correct Site, Correct Laterality, Correct Procedure, Correct Position, site marked, Risks and benefits discussed,  Surgical consent,  Pre-op evaluation,  At surgeon's request and post-op pain management  Laterality: Right  Prep: Dura Prep       Needles:  Injection technique: Single-shot  Needle Type: Echogenic Stimulator Needle     Needle Length: 9cm  Needle Gauge: 20     Additional Needles:   Procedures:,,,, ultrasound used (permanent image in chart),,,,  Narrative:  Start time: 08/03/2020 6:55 AM End time: 08/03/2020 7:00 AM Injection made incrementally with aspirations every 5 mL.  Performed by: Personally  Anesthesiologist: Darral Dash, DO  Additional Notes: Patient identified. Risks/Benefits/Options discussed with patient including but not limited to bleeding, infection, nerve damage, failed block, incomplete pain control. Patient expressed understanding and wished to proceed. All questions were answered. Sterile technique was used throughout the entire procedure. Please see nursing notes for vital signs. Aspirated in 5cc intervals with injection for negative confirmation. Patient was given instructions on fall risk and not to get out of bed. All questions and concerns addressed with instructions to call with any issues or inadequate analgesia.

## 2020-08-03 NOTE — Evaluation (Signed)
Physical Therapy Evaluation Patient Details Name: Veronica Price MRN: [id number] DOB: September 15, 1946 Today's Date: 08/03/2020   History of Present Illness  Patient is a 74 y.o. female s/p Rt TKA on 08/03/20 with PMH signicant for GERD, OA, breast cancer, hypothyroidism.  Clinical Impression  Veronica Price is a 74 y.o. female POD 0 s/p Rt TKA. Patient reports independence with mobility at baseline. Patient is now limited by functional impairments (see PT problem list below) and requires min guard/supervision for transfers and gait with RW. Patient was able to ambulate ~120 feet with RW and min guard/supervisin and cues for safe walker management. Patient educated on safe sequencing for stair mobility and verbalized safe guarding position for people assisting with mobility. Patient instructed in exercises to facilitate ROM and circulation. Patient will benefit from continued skilled PT interventions to address impairments and progress towards PLOF. Patient has met mobility goals at adequate level for discharge home; will continue to follow if pt continues acute stay to progress towards Mod I goals.     Follow Up Recommendations Follow surgeon's recommendation for DC plan and follow-up therapies;Home health PT    Equipment Recommendations  None recommended by PT    Recommendations for Other Services       Precautions / Restrictions Precautions Precautions: Fall Restrictions Weight Bearing Restrictions: No      Mobility  Bed Mobility Overal bed mobility: Needs Assistance Bed Mobility: Supine to Sit     Supine to sit: Min guard     General bed mobility comments: no assist required, pt used bed rail to raise trunk.     Transfers Overall transfer level: Needs assistance Equipment used: Rolling walker (2 wheeled) Transfers: Sit to/from Stand Sit to Stand: Min guard;Supervision         General transfer comment: cues for safe reach back to sit and to extend Rt knee to prevent  excessive flexion with sitting. min guard to supervision for rise.  Ambulation/Gait Ambulation/Gait assistance: Min guard;Supervision Gait Distance (Feet): 120 Feet Assistive device: Rolling walker (2 wheeled) Gait Pattern/deviations: Step-to pattern;Decreased stride length;Decreased weight shift to right Gait velocity: decr   General Gait Details: VC's for safe step pattern and proximity to RW, no overt LOB noted during gait and no buckling at Rt knee.   Stairs Stairs: Yes Stairs assistance: Min guard Stair Management: No rails;Step to pattern;Forwards;Backwards;With walker Number of Stairs: 2 (2x1) General stair comments: cues for safe sequencing "up with good, down with bad" and for safe management of RW. Pt able to complete single step up forwards and backwards with no overt LOB.  Wheelchair Mobility    Modified Rankin (Stroke Patients Only)       Balance Overall balance assessment: Needs assistance Sitting-balance support: Feet supported Sitting balance-Leahy Scale: Good     Standing balance support: During functional activity;Bilateral upper extremity supported Standing balance-Leahy Scale: Fair                               Pertinent Vitals/Pain Pain Assessment: 0-10 Pain Score: 4  Pain Location: Rt knee Pain Descriptors / Indicators: Aching;Discomfort;Sore Pain Intervention(s): Limited activity within patient's tolerance;Monitored during session;Repositioned;Ice applied    Home Living Family/patient expects to be discharged to:: Private residence Living Arrangements: Spouse/significant other Available Help at Discharge: Family Type of Home: House Home Access: Stairs to enter Entrance Stairs-Rails: None Entrance Stairs-Number of Steps: 1 Home Layout: One level;Laundry or work area in [employer]:  Walker - 2 wheels;Bedside commode;Cane - single point      Prior Function Level of Independence: Independent               Hand  Dominance   Dominant Hand: Right    Extremity/Trunk Assessment   Upper Extremity Assessment Upper Extremity Assessment: Overall WFL for tasks assessed    Lower Extremity Assessment Lower Extremity Assessment: RLE deficits/detail RLE Deficits / Details: good quad activation, no extensor lag with SLR RLE Sensation: WNL RLE Coordination: WNL    Cervical / Trunk Assessment Cervical / Trunk Assessment: Normal  Communication   Communication: No difficulties  Cognition Arousal/Alertness: Awake/alert Behavior During Therapy: WFL for tasks assessed/performed Overall Cognitive Status: Within Functional Limits for tasks assessed                                        General Comments      Exercises Total Joint Exercises Ankle Circles/Pumps: AROM;Both;10 reps;Seated Quad Sets: AROM;Right;5 reps;Seated Heel Slides: AROM;Right;5 reps;Seated Hip ABduction/ADduction: AROM;Right;5 reps;Seated   Assessment/Plan    PT Assessment Patient needs continued PT services  PT Problem List Decreased strength;Decreased range of motion;Decreased activity tolerance;Decreased balance;Decreased mobility;Decreased knowledge of use of DME;Decreased knowledge of precautions       PT Treatment Interventions DME instruction;Gait training;Stair training;Functional mobility training;Therapeutic activities;Therapeutic exercise;Balance training;Patient/family education    PT Goals (Current goals can be found in the Care Plan section)  Acute Rehab PT Goals Patient Stated Goal: return home today PT Goal Formulation: With patient Time For Goal Achievement: 08/10/20 Potential to Achieve Goals: Good    Frequency 7X/week   Barriers to discharge        Co-evaluation               AM-PAC PT "6 Clicks" Mobility  Outcome Measure Help needed turning from your back to your side while in a flat bed without using bedrails?: None Help needed moving from lying on your back to sitting on  the side of a flat bed without using bedrails?: None Help needed moving to and from a bed to a chair (including a wheelchair)?: A Little Help needed standing up from a chair using your arms (e.g., wheelchair or bedside chair)?: A Little Help needed to walk in hospital room?: A Little Help needed climbing 3-5 steps with a railing? : A Little 6 Click Score: 20    End of Session Equipment Utilized During Treatment: Gait belt Activity Tolerance: Patient tolerated treatment well Patient left: in chair;with family/visitor present Nurse Communication: Mobility status PT Visit Diagnosis: Muscle weakness (generalized) (M62.81);Difficulty in walking, not elsewhere classified (R26.2)    Time: 9242-6834 PT Time Calculation (min) (ACUTE ONLY): 39 min   Charges:   PT Evaluation $PT Eval Low Complexity: 1 Low PT Treatments $Gait Training: 8-22 mins $Therapeutic Exercise: 8-22 mins        Verner Mould, DPT Acute Rehabilitation Services  Office [phone] Pager [phone]  08/03/2020 4:12 PM

## 2020-08-03 NOTE — Anesthesia Procedure Notes (Signed)
Spinal  Patient location during procedure: OR Start time: 08/03/2020 7:55 AM End time: 08/03/2020 7:58 AM Reason for block: at surgeon's request Staffing Performed: anesthesiologist  Anesthesiologist: Darral Dash, DO Preanesthetic Checklist Completed: patient identified, IV checked, site marked, risks and benefits discussed, surgical consent, monitors and equipment checked, pre-op evaluation and timeout performed Spinal Block Patient position: sitting Prep: DuraPrep Patient monitoring: heart rate, cardiac monitor, continuous pulse ox and blood pressure Approach: midline Location: L3-4 Injection technique: single-shot Needle Needle type: Pencan  Needle gauge: 24 G Needle length: 10 cm Additional Notes Patient identified. Risks/Benefits/Options discussed with patient including but not limited to bleeding, infection, nerve damage, paralysis, failed block, incomplete pain control, headache, blood pressure changes, nausea, vomiting, reactions to medications, itching and postpartum back pain. Confirmed with bedside nurse the patient's most recent platelet count. Confirmed with patient that they are not currently taking any anticoagulation, have any bleeding history or any family history of bleeding disorders. Patient expressed understanding and wished to proceed. All questions were answered. Sterile technique was used throughout the entire procedure. Please see nursing notes for vital signs. Warning signs of high block given to the patient including shortness of breath, tingling/numbness in hands, complete motor block, or any concerning symptoms with instructions to call for help. Patient was given instructions on fall risk and not to get out of bed. All questions and concerns addressed with instructions to call with any issues or inadequate analgesia.

## 2020-08-03 NOTE — Anesthesia Postprocedure Evaluation (Signed)
Anesthesia Post Note  Patient: Veronica Price  Procedure(s) Performed: TOTAL KNEE ARTHROPLASTY (Right Knee)     Patient location during evaluation: PACU Anesthesia Type: Spinal, Regional and MAC Level of consciousness: oriented and awake and alert Pain management: pain level controlled Vital Signs Assessment: post-procedure vital signs reviewed and stable Respiratory status: spontaneous breathing, respiratory function stable and patient connected to nasal cannula oxygen Cardiovascular status: blood pressure returned to baseline and stable Postop Assessment: no headache, no backache and no apparent nausea or vomiting Anesthetic complications: no   No complications documented.  Last Vitals:  Vitals:   08/03/20 1430 08/03/20 1445  BP:    Pulse: (!) 58 (!) 56  Resp:    Temp:    SpO2: 96% 96%    Last Pain:  Vitals:   08/03/20 1318  TempSrc:   PainSc: 6                  Garrit Marrow P Rema Lievanos

## 2020-08-03 NOTE — Transfer of Care (Signed)
Immediate Anesthesia Transfer of Care Note  Patient: Veronica Price  Procedure(s) Performed: TOTAL KNEE ARTHROPLASTY (Right Knee)  Patient Location: PACU  Anesthesia Type:Spinal  Level of Consciousness: awake  Airway & Oxygen Therapy: Patient Spontanous Breathing and Patient connected to face mask oxygen  Post-op Assessment: Report given to RN and Post -op Vital signs reviewed and stable  Post vital signs: Reviewed and stable  Last Vitals:  Vitals Value Taken Time  BP 111/67 08/03/20 1005  Temp    Pulse 66 08/03/20 1006  Resp 12 08/03/20 1006  SpO2 99 % 08/03/20 1006  Vitals shown include unvalidated device data.  Last Pain:  Vitals:   08/03/20 0535  TempSrc: Oral         Complications: No complications documented.

## 2020-08-03 NOTE — Op Note (Signed)
NAME: Veronica Price, Veronica Price MEDICAL RECORD RA:[id number] ACCOUNT [id number] DATE OF BIRTH:[birth year] FACILITY: [facility] LOCATION: [facility] PHYSICIAN:W. Calahan Pak JR., MD  OPERATIVE REPORT  DATE OF PROCEDURE:  08/03/2020  PREOPERATIVE DIAGNOSIS:  Severe osteoarthritis, right knee with varus deformity and flexion contracture.  OPERATION:  Right total knee replacement (DePuy Attune knee, size 4 femur, 7 mm, size 4 tibial bearing with size 5 tibia and 35 mm all poly patella).  SURGEON:  Vangie Bicker, MD  ASSISTANTMarjo Bicker.  ANESTHESIA:  Spinal with adductor block.  TOURNIQUET TIME:  58 minutes.  DESCRIPTION OF PROCEDURE:  Exsanguination of leg, inflation of thigh tourniquet 350.  Straight skin incision made.  Medial parapatellar approach to the knee made.  We did a 10 mm 5 degree valgus cut on the femur followed by cutting about 9 mm below the  least diseased lateral compartment, moderate medial stripping was done due to the varus deformity of the knee.  Extension gap eventually measured at 7 mm.  Femur was sized to be a size 4 by placing all-in-1 cutting block with appropriate degree of  external rotation.  Cutting the anterior and posterior cuts with the chamfer cuts.  PCL was completely released.  We did remove osteophytes from the posterior, medial and lateral aspect of the knee.  Tibia was sized to be a size 5 followed by placement  of the keel for the tibia and the baseplate.  A trial box cut was then done on the femur.  Trial was carried out with full extension.  We eventually decided on the 7 mm bearing with excellent tracking.  Good balancing of the ligaments was noted as well.   Full extension, good stability in flexion.  9.5 mm cut was made on the patella, placement of all poly trial.  All parameters deemed to be acceptable.  Final components were inserted with cement in the doughy state, tibia, followed by femur and patella.   Cement was allowed to harden.  A trial bearing  was removed.  Small bits of cement removed posterior aspect of the knee.  Tourniquet was released under direct vision, no excessive bleeding was noted.  Final bearing was inserted.  All parameters deemed to  be acceptable.  Closure was affected with #1 Ethibond on the capsule, 2-0 Vicryl and Monocryl in the skin.  A lightly compressive sterile dressing and knee immobilizer applied.  IN/NUANCE  D:08/03/2020 T:08/03/2020 JOB:[id number]

## 2020-08-03 NOTE — Anesthesia Procedure Notes (Signed)
Date/Time: 08/03/2020 7:46 AM Performed by: Sharlette Dense, CRNA Oxygen Delivery Method: Simple face mask

## 2020-08-04 DIAGNOSIS — Z471 Aftercare following joint replacement surgery: Secondary | ICD-10-CM | POA: Diagnosis not present

## 2020-08-04 DIAGNOSIS — Z96651 Presence of right artificial knee joint: Secondary | ICD-10-CM | POA: Diagnosis not present

## 2020-08-04 DIAGNOSIS — E039 Hypothyroidism, unspecified: Secondary | ICD-10-CM | POA: Diagnosis not present

## 2020-08-04 DIAGNOSIS — M1711 Unilateral primary osteoarthritis, right knee: Secondary | ICD-10-CM | POA: Diagnosis not present

## 2020-08-06 ENCOUNTER — Encounter (HOSPITAL_COMMUNITY): Payer: Self-pay | Admitting: Orthopedic Surgery

## 2020-08-06 DIAGNOSIS — E039 Hypothyroidism, unspecified: Secondary | ICD-10-CM | POA: Diagnosis not present

## 2020-08-06 DIAGNOSIS — M1711 Unilateral primary osteoarthritis, right knee: Secondary | ICD-10-CM | POA: Diagnosis not present

## 2020-08-06 DIAGNOSIS — Z96651 Presence of right artificial knee joint: Secondary | ICD-10-CM | POA: Diagnosis not present

## 2020-08-06 DIAGNOSIS — Z471 Aftercare following joint replacement surgery: Secondary | ICD-10-CM | POA: Diagnosis not present

## 2020-08-08 DIAGNOSIS — Z471 Aftercare following joint replacement surgery: Secondary | ICD-10-CM | POA: Diagnosis not present

## 2020-08-08 DIAGNOSIS — M1711 Unilateral primary osteoarthritis, right knee: Secondary | ICD-10-CM | POA: Diagnosis not present

## 2020-08-08 DIAGNOSIS — Z96651 Presence of right artificial knee joint: Secondary | ICD-10-CM | POA: Diagnosis not present

## 2020-08-08 DIAGNOSIS — E039 Hypothyroidism, unspecified: Secondary | ICD-10-CM | POA: Diagnosis not present

## 2020-08-10 DIAGNOSIS — Z96651 Presence of right artificial knee joint: Secondary | ICD-10-CM | POA: Diagnosis not present

## 2020-08-10 DIAGNOSIS — E039 Hypothyroidism, unspecified: Secondary | ICD-10-CM | POA: Diagnosis not present

## 2020-08-10 DIAGNOSIS — Z471 Aftercare following joint replacement surgery: Secondary | ICD-10-CM | POA: Diagnosis not present

## 2020-08-10 DIAGNOSIS — M1711 Unilateral primary osteoarthritis, right knee: Secondary | ICD-10-CM | POA: Diagnosis not present

## 2020-08-13 DIAGNOSIS — M1711 Unilateral primary osteoarthritis, right knee: Secondary | ICD-10-CM | POA: Diagnosis not present

## 2020-08-13 DIAGNOSIS — Z96651 Presence of right artificial knee joint: Secondary | ICD-10-CM | POA: Diagnosis not present

## 2020-08-13 DIAGNOSIS — E039 Hypothyroidism, unspecified: Secondary | ICD-10-CM | POA: Diagnosis not present

## 2020-08-13 DIAGNOSIS — Z471 Aftercare following joint replacement surgery: Secondary | ICD-10-CM | POA: Diagnosis not present

## 2020-08-16 ENCOUNTER — Inpatient Hospital Stay: Payer: Medicare Other

## 2020-08-16 ENCOUNTER — Other Ambulatory Visit: Payer: Self-pay

## 2020-08-16 ENCOUNTER — Inpatient Hospital Stay: Payer: Medicare Other | Attending: Oncology | Admitting: Oncology

## 2020-08-16 VITALS — BP 154/80 | HR 69 | Temp 97.6°F | Resp 18 | Ht 62.0 in | Wt 161.9 lb

## 2020-08-16 DIAGNOSIS — Z9221 Personal history of antineoplastic chemotherapy: Secondary | ICD-10-CM | POA: Insufficient documentation

## 2020-08-16 DIAGNOSIS — Z923 Personal history of irradiation: Secondary | ICD-10-CM | POA: Diagnosis not present

## 2020-08-16 DIAGNOSIS — Z171 Estrogen receptor negative status [ER-]: Secondary | ICD-10-CM | POA: Diagnosis not present

## 2020-08-16 DIAGNOSIS — C50411 Malignant neoplasm of upper-outer quadrant of right female breast: Secondary | ICD-10-CM | POA: Diagnosis not present

## 2020-08-16 DIAGNOSIS — Z79899 Other long term (current) drug therapy: Secondary | ICD-10-CM | POA: Insufficient documentation

## 2020-08-16 DIAGNOSIS — Z96641 Presence of right artificial hip joint: Secondary | ICD-10-CM | POA: Diagnosis not present

## 2020-08-16 LAB — CBC WITH DIFFERENTIAL (CANCER CENTER ONLY)
Abs Immature Granulocytes: 0.02 10*3/uL (ref 0.00–0.07)
Basophils Absolute: 0.1 10*3/uL (ref 0.0–0.1)
Basophils Relative: 1 %
Eosinophils Absolute: 0.1 10*3/uL (ref 0.0–0.5)
Eosinophils Relative: 2 %
HCT: 35.9 % — ABNORMAL LOW (ref 36.0–46.0)
Hemoglobin: 11.7 g/dL — ABNORMAL LOW (ref 12.0–15.0)
Immature Granulocytes: 0 %
Lymphocytes Relative: 22 %
Lymphs Abs: 1.3 10*3/uL (ref 0.7–4.0)
MCH: 32.2 pg (ref 26.0–34.0)
MCHC: 32.6 g/dL (ref 30.0–36.0)
MCV: 98.9 fL (ref 80.0–100.0)
Monocytes Absolute: 0.6 10*3/uL (ref 0.1–1.0)
Monocytes Relative: 9 %
Neutro Abs: 4.1 10*3/uL (ref 1.7–7.7)
Neutrophils Relative %: 66 %
Platelet Count: 334 10*3/uL (ref 150–400)
RBC: 3.63 MIL/uL — ABNORMAL LOW (ref 3.87–5.11)
RDW: 14 % (ref 11.5–15.5)
WBC Count: 6.2 10*3/uL (ref 4.0–10.5)
nRBC: 0 % (ref 0.0–0.2)

## 2020-08-16 LAB — CMP (CANCER CENTER ONLY)
ALT: 18 U/L (ref 0–44)
AST: 15 U/L (ref 15–41)
Albumin: 3.7 g/dL (ref 3.5–5.0)
Alkaline Phosphatase: 68 U/L (ref 38–126)
Anion gap: 7 (ref 5–15)
BUN: 15 mg/dL (ref 8–23)
CO2: 29 mmol/L (ref 22–32)
Calcium: 9.1 mg/dL (ref 8.9–10.3)
Chloride: 103 mmol/L (ref 98–111)
Creatinine: 0.87 mg/dL (ref 0.44–1.00)
GFR, Estimated: >60 mL/min (ref >60)
Glucose, Bld: 86 mg/dL (ref 70–99)
Potassium: 3.9 mmol/L (ref 3.5–5.1)
Sodium: 139 mmol/L (ref 135–145)
Total Bilirubin: 0.6 mg/dL (ref 0.3–1.2)
Total Protein: 7.5 g/dL (ref 6.5–8.1)

## 2020-08-16 LAB — TSH: TSH: 6.34 u[IU]/mL — ABNORMAL HIGH (ref 0.308–3.960)

## 2020-08-16 NOTE — Progress Notes (Signed)
Veronica Price:([phone] [phone]     ID: Veronica Price DOB: [birth year]  MR#: [id number]  VQQ#:[id number]  Patient Care Team: Renee Pain, FNP as PCP - General (Nurse Practitioner) Jakiah Bienaime, [name], MD as Consulting Physician (Oncology) [name], MD as Consulting Physician (General Surgery) [name], MD as Radiation Oncologist (Radiation Oncology) Earlie Server, MD as Consulting Physician (Orthopedic Surgery) OTHER MD:   CHIEF COMPLAINT: Triple negative breast cancer  CURRENT TREATMENT: Observation   INTERVAL HISTORY: Veronica Price today for follow-up of her triple negative breast cancer. She is on observation.    I reviewed her chest CT and abdominal MRI from last November.  She has not yet had mammography this year.  She usually obtains that in [place]: Veronica Price 08/03/2020 under Dr. French Ana.  She generally did well with the surgery.  She has had 5 days of in-home PT.  She has a little pain around her right ankle with no significant swelling.  She will be following up with Dr. French Ana tomorrow and at some point she plans to have the left knee done as well.  Aside from this issue she does some housework but does not exercise regularly.  She does take care of her mom who is bedridden but still lives independently at age 29.   COVID 19 VACCINATION STATUS: s/p Moderna x 2, most recent dose FEB 2021   HISTORY OF CURRENT ILLNESS: Veronica Price initially palpated a mass to her right breast in February 2019.   She brought her to her primary care physician's attention and was set up for bilateral diagnostic mammography with tomography and right breast ultrasonography on 11/25/2017 showing: a 3.2 x 3.7 cm lobulated mass in the UOQ of the right breast; there was a 3 cm calcified retroareolar mass in the left breast. On right breast ultrasonography  there was a complex mostly solid 2.4 x 3.1 cm mass at 10 o'clock and multiple enlarged right axillary lymph nodes, the largest measuring 1.6 x 2.4.  Accordingly on 11/27/2017 the patient proceeded to biopsy of the right breast area in question, as well as an attempted axillary node biopsy. The pathology from this procedure showed ([id number]):  high grade infiltrating ductal carcinoma with no lymphovascular invasion.  The attempted axillary node biopsy showed fat, no nodal tissue.  Prognostic indicators were: estrogen and progesterone receptor negative,. Proliferation marker Ki67 at >80%. HER2 negative by immunohistochemistry at 0%  CT scans of the chest abdomen and pelvis completed on 12/10/2017 showed A complex solid and cystic 3.5 cm right breast mass with several bilateral axillary lymph nodes, the largest in the right axilla measured 12 mm. There was no evidence of visceral metastatic disease.   She had a bone scan on 12/10/2017 that showed two areas of increased uptake overlying the lateral aspects of the T11 vertebrae. Review of the CT scan of the chest revealed sclerosis of the T11 pedicles concerning for bone metastases.  However subsequent spinal MRI (I do not have that report) read this out as arthritis, not a metastatic deposit  The patient had a echocardiogram completed on 3/72019 with results showing: left ventricular ejection fraction of 55-60% with mild concentric hypertrophy. There was mild to moderate mitral and mild tricuspid regurgitation with mild pulmonary hypertension. The aortic valve was mildly thickened with trace aortic regurgitation.   The patient's subsequent history is as detailed below.   PAST MEDICAL  HISTORY: Past Medical History:  Diagnosis Date  . Arthritis 2003  . Breast cancer ([place]) 11/25/2017   Right breast  . Cataract 2016   "early" per patient-no surgery yet   . GERD (gastroesophageal reflux disease) 2003   per patient report  . Hypothyroidism 10/20/2018     PAST SURGICAL HISTORY: Past Surgical History:  Procedure Laterality Date  . ABDOMINAL HYSTERECTOMY  2002   Total hysterectomy in 2002  . BREAST LUMPECTOMY WITH RADIOACTIVE SEED AND SENTINEL LYMPH NODE BIOPSY Right 06/28/2018   Procedure: BREAST LUMPECTOMY WITH RADIOACTIVE SEED AND SENTINEL LYMPH NODE BIOPSY;  Surgeon: [name], MD;  Location: [facility];  Service: General;  Laterality: Right;  . PORTACATH PLACEMENT N/A 12/29/2017   Procedure: INSERTION PORT-A-CATH LEFT SUBCLAVIAN;  Surgeon: [name], MD;  Location: [facility];  Service: General;  Laterality: N/A;  . TOTAL KNEE ARTHROPLASTY Right 08/03/2020   Procedure: TOTAL KNEE ARTHROPLASTY;  Surgeon: Earlie Server, MD;  [facility];  Service: Orthopedics;  Laterality: Right;    FAMILY HISTORY Family History  Problem Relation Age of Onset  . Diabetes Father   . Heart attack Father    She notes that her father died from a possible MI in his early 27's Her mother is still alive and will be 46 March 2019!  The patient has 2 brothers and 2 sisters. Her brother had prostate cancer possibly agent orange related. She has a cousin who was recently diagnosed with stomach cancer. Her maternal grandmother died from colon cancer. She has a maternal uncle with bone cancer. She denies family hx of breast or ovarian cancer.    GYNECOLOGIC HISTORY:  Menarche: 74 years old Age at first live birth: 74 years old [place] P3 LMP: at age 65 Contraceptive: OCP HRT   Hysterectomy? 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 She also worked at [employer]. She lives at home with her husband, Veronica Price who is a retired Building control surveyor, her husband works part time driving an adult day care bus. Her daughter, Veronica Price is a Multimedia programmer. Her daughter Veronica Price lives in [place], [place] and works as a Therapist, sports for the [place]. Her son Veronica Price,  lives in [place], and works as a Administrator.  The patient has 3 grandchildren and no great-grandchildren. She is of baptist faith.     ADVANCED DIRECTIVES:    HEALTH MAINTENANCE: Social History   Tobacco Use  . Smoking status: Never Smoker  . Smokeless tobacco: Never Used  Vaping Use  . Vaping Use: Never used  Substance Use Topics  . Alcohol use: Never  . Drug use: Never     Colonoscopy: UTD; last year (2018)  PAP:  Bone density:    Allergies  Allergen Reactions  . Prednisone Swelling and Other (See Comments)    " I think it was this that made my tongue swell "--Patient has received steroid injections without any issues--[facility] 04/12/2019    Current Outpatient Medications  Medication Sig Dispense Refill  . acetaminophen (TYLENOL) 325 MG tablet Take 2 tablets (650 mg total) by mouth every 4 (four) hours as needed. 100 tablet 2  . aspirin EC 81 MG tablet Take 1 tablet (81 mg total) by mouth 2 (two) times daily. TO PREVENT BLOOD CLOTS 60 tablet 0  . docusate sodium (COLACE) 100 MG capsule Take 1 capsule (100 mg total) by mouth daily as  needed. 30 capsule 2  . levothyroxine (SYNTHROID) 50 MCG tablet TAKE 1 TABLET BY MOUTH DAILY BEFORE BREAKFAST 90 tablet 1  . oxyCODONE (OXY IR/ROXICODONE) 5 MG immediate release tablet Take one tab po q4-6hrs prn pain, may need 1-2 first few days, do not exceed 8 tabs in 24hrs. 40 tablet 0   No current facility-administered medications for this visit.    OBJECTIVE:  African-American woman who appears stated age  74:   08/16/20 1116  BP: (!) 154/80  Pulse: 69  Resp: 18  Temp: 97.6 F (36.4 C)  SpO2: 100%     Body mass index is 29.61 kg/m.   Wt Readings from Last 3 Encounters:  08/16/20 161 lb 14.4 oz (73.4 kg)  08/03/20 172 lb (78 kg)  07/23/20 172 lb (78 kg)  ECOG FS:1 - Symptomatic but completely ambulatory  Sclerae unicteric, EOMs intact Wearing a mask No cervical or supraclavicular adenopathy Lungs no rales or  rhonchi Heart regular rate and rhythm Abd soft, nontender, positive bowel sounds MSK no focal spinal tenderness, no upper extremity lymphedema Neuro: nonfocal, well oriented, appropriate affect Breasts: The right breast is status post lumpectomy and radiation.  There is moderate distortion of the breast contour.  There is no evidence of local recurrence.  Left breast is benign.  Both axillae are benign.   LAB RESULTS:  CMP     Component Value Date/Time   NA 139 08/16/2020 1025   K 3.9 08/16/2020 1025   CL 103 08/16/2020 1025   CO2 29 08/16/2020 1025   GLUCOSE 86 08/16/2020 1025   BUN 15 08/16/2020 1025   CREATININE 0.87 08/16/2020 1025   CALCIUM 9.1 08/16/2020 1025   PROT 7.5 08/16/2020 1025   ALBUMIN 3.7 08/16/2020 1025   AST 15 08/16/2020 1025   ALT 18 08/16/2020 1025   ALKPHOS 68 08/16/2020 1025   BILITOT 0.6 08/16/2020 1025   GFRNONAA >60 08/16/2020 1025   GFRAA >60 02/14/2020 1010   GFRAA >60 02/08/2019 0805    Lab Results  Component Value Date   WBC 6.2 08/16/2020   NEUTROABS 4.1 08/16/2020   HGB 11.7 (L) 08/16/2020   HCT 35.9 (L) 08/16/2020   MCV 98.9 08/16/2020   PLT 334 08/16/2020   No results found for: LABCA2  No components found for: GGYIRS854  No results for input(s): INR in the last 168 hours.  No results found for: LABCA2  No results found for: OEV035  No results found for: KKX381  No results found for: WEX937  No results found for: CA2729  No components found for: HGQUANT  No results found for: CEA1 / No results found for: CEA1   No results found for: AFPTUMOR  No results found for: CHROMOGRNA  No results found for: TOTALPROTELP, ALBUMINELP, A1GS, A2GS, BETS, BETA2SER, GAMS, MSPIKE, SPEI (this displays SPEP labs)  No results found for: KPAFRELGTCHN, LAMBDASER, KAPLAMBRATIO (kappa/lambda light chains)  No results found for: HGBA, HGBA2QUANT, HGBFQUANT, HGBSQUAN (Hemoglobinopathy evaluation)   No results found for: LDH  No  results found for: IRON, TIBC, IRONPCTSAT (Iron and TIBC)  No results found for: FERRITIN  Urinalysis    Component Value Date/Time   COLORURINE YELLOW 07/23/2020 [place] 07/23/2020 1507   LABSPEC 1.018 07/23/2020 1507   PHURINE 5.0 07/23/2020 [place] 07/23/2020 1507   [place] (A) 07/23/2020 [place] 07/23/2020 [place] 07/23/2020 [place] 07/23/2020 1507   NITRITE  NEGATIVE 07/23/2020 1507   LEUKOCYTESUR MODERATE (A) 07/23/2020 1507    STUDIES: No results found.   ELIGIBLE FOR AVAILABLE RESEARCH PROTOCOL:  SWOG [id number] (observation arm)  ASSESSMENT: 74 y.o. [name], [place] woman status post right breast upper outer quadrant biopsy 11/25/2017 for a clinical T2 N2, stage IIIc invasive ductal carcinoma, grade 3, triple negative, with an MIB-1 of 80%.  (1) staging studies:  (a) chest CT 12/10/2017 scan showed no visceral metastatic disease  (b) bone scan 12/10/2017 showed uptake at T11, with sclerosis.  (c) CA-27-29 on 12/05/2017 was 19.0  (d) spinal MRI in [place] reportedly showed only arthrtis at T11  (2) neoadjuvant chemotherapy consisting of carboplatin/ paclitaxel x 12 (received 9) started 01/04/2018, discontinued 03/15/2018, followed by cyclophosphamide and doxorubicin given every 3 weeks x 4 starting 04/05/2018, completed 06/14/2018  (a) carboplatin/paclitaxel changed to carboplatin/gemcitabine after 8 cycles due to neuroapthy  (b) only able to tolerate 1 cycle of Gemcitabine/Carboplatin due to rash  (a) echocardiogram on 04/02/18 demonstrates a LVEF of 60-65%  (3) status post right lumpectomy and sentinel lymph node sampling on 06/28/2018 showing a residual 2.2 cm, grade 3 invasive ductal carcinoma, with negative margins and 0 of 3 sentinel lymph nodes removed involved (ypT2 ypN0)  (4) adjuvant radiation through the [facility] group in [place] mid November through mid December 2019  (5)  participating in the pembrolizumab study S1418, randomized to observation  (6) pembrolizumab started (not as part of study) 11/15/2018, repeated every 21 days, complicated by rash, last dose 05/24/2019  (7) additional studies  (a) chest CT scan 08/09/2019 showed no evidence of metastases.  A left renal cyst was noted  (b) abdominal MRI 08/23/2019 showed a benign Bosniak 2 renal cyst and additional Bosniak 1 renal cysts.  There was no evidence of metastatic disease.   PLAN:  Trenity is a little over 2 years out from definitive surgery for her breast cancer with no evidence of disease recurrence.  This is favorable.  We are continuing observation for her triple negative tumor.  She is due for mammography which she usually obtains in [place].  I have gone ahead and written her a prescription to get that scheduled for this month and for them to fax me a report  Assuming that is benign as expected I will see her again in 1 year.  She knows to call for any other issue that may develop before then  Total encounter time 25 minutes.Sarajane Jews C. Braxtyn Bojarski, MD 08/16/20 12:11 PM Medical Oncology and Hematology [facility] [place], [place] [address] Tel. [phone]    Fax. [phone]   I, Wilburn Mylar, am acting as scribe for Dr. [name]. Debbrah Sampedro.  I, Lurline Del MD, have reviewed the above documentation for accuracy and completeness, and I agree with the above.    *Total Encounter Time as defined by the Centers for Medicare and Medicaid Services includes, in addition to the face-to-face time of a patient visit (documented in the note above) non-face-to-face time: obtaining and reviewing outside history, ordering and reviewing medications, tests or procedures, care coordination (communications with other health care professionals or caregivers) and documentation in the medical record.

## 2020-08-17 DIAGNOSIS — M170 Bilateral primary osteoarthritis of knee: Secondary | ICD-10-CM | POA: Diagnosis not present

## 2020-08-27 DIAGNOSIS — Z23 Encounter for immunization: Secondary | ICD-10-CM | POA: Diagnosis not present

## 2020-08-27 DIAGNOSIS — Z4889 Encounter for other specified surgical aftercare: Secondary | ICD-10-CM | POA: Diagnosis not present

## 2020-08-27 DIAGNOSIS — M25562 Pain in left knee: Secondary | ICD-10-CM | POA: Diagnosis not present

## 2020-08-29 DIAGNOSIS — M25562 Pain in left knee: Secondary | ICD-10-CM | POA: Diagnosis not present

## 2020-08-29 DIAGNOSIS — Z4889 Encounter for other specified surgical aftercare: Secondary | ICD-10-CM | POA: Diagnosis not present

## 2020-09-03 DIAGNOSIS — Z4889 Encounter for other specified surgical aftercare: Secondary | ICD-10-CM | POA: Diagnosis not present

## 2020-09-03 DIAGNOSIS — M25561 Pain in right knee: Secondary | ICD-10-CM | POA: Diagnosis not present

## 2020-09-03 DIAGNOSIS — M25562 Pain in left knee: Secondary | ICD-10-CM | POA: Diagnosis not present

## 2020-09-05 DIAGNOSIS — Z4889 Encounter for other specified surgical aftercare: Secondary | ICD-10-CM | POA: Diagnosis not present

## 2020-09-05 DIAGNOSIS — M25561 Pain in right knee: Secondary | ICD-10-CM | POA: Diagnosis not present

## 2020-09-05 DIAGNOSIS — M25562 Pain in left knee: Secondary | ICD-10-CM | POA: Diagnosis not present

## 2020-09-10 DIAGNOSIS — M25561 Pain in right knee: Secondary | ICD-10-CM | POA: Diagnosis not present

## 2020-09-10 DIAGNOSIS — Z4889 Encounter for other specified surgical aftercare: Secondary | ICD-10-CM | POA: Diagnosis not present

## 2020-09-10 DIAGNOSIS — M25562 Pain in left knee: Secondary | ICD-10-CM | POA: Diagnosis not present

## 2020-09-12 DIAGNOSIS — M25561 Pain in right knee: Secondary | ICD-10-CM | POA: Diagnosis not present

## 2020-09-12 DIAGNOSIS — M25562 Pain in left knee: Secondary | ICD-10-CM | POA: Diagnosis not present

## 2020-09-12 DIAGNOSIS — Z4889 Encounter for other specified surgical aftercare: Secondary | ICD-10-CM | POA: Diagnosis not present

## 2020-09-14 DIAGNOSIS — M170 Bilateral primary osteoarthritis of knee: Secondary | ICD-10-CM | POA: Diagnosis not present

## 2020-09-17 DIAGNOSIS — M25561 Pain in right knee: Secondary | ICD-10-CM | POA: Diagnosis not present

## 2020-09-17 DIAGNOSIS — Z4889 Encounter for other specified surgical aftercare: Secondary | ICD-10-CM | POA: Diagnosis not present

## 2020-09-19 DIAGNOSIS — Z4889 Encounter for other specified surgical aftercare: Secondary | ICD-10-CM | POA: Diagnosis not present

## 2020-09-19 DIAGNOSIS — M25561 Pain in right knee: Secondary | ICD-10-CM | POA: Diagnosis not present

## 2020-09-24 DIAGNOSIS — M25561 Pain in right knee: Secondary | ICD-10-CM | POA: Diagnosis not present

## 2020-09-24 DIAGNOSIS — Z4889 Encounter for other specified surgical aftercare: Secondary | ICD-10-CM | POA: Diagnosis not present

## 2020-09-26 DIAGNOSIS — M25561 Pain in right knee: Secondary | ICD-10-CM | POA: Diagnosis not present

## 2020-09-26 DIAGNOSIS — Z4889 Encounter for other specified surgical aftercare: Secondary | ICD-10-CM | POA: Diagnosis not present

## 2020-10-01 DIAGNOSIS — M25561 Pain in right knee: Secondary | ICD-10-CM | POA: Diagnosis not present

## 2020-10-01 DIAGNOSIS — Z4889 Encounter for other specified surgical aftercare: Secondary | ICD-10-CM | POA: Diagnosis not present

## 2020-10-03 DIAGNOSIS — Z4889 Encounter for other specified surgical aftercare: Secondary | ICD-10-CM | POA: Diagnosis not present

## 2020-10-03 DIAGNOSIS — M25561 Pain in right knee: Secondary | ICD-10-CM | POA: Diagnosis not present

## 2020-10-18 DIAGNOSIS — M1712 Unilateral primary osteoarthritis, left knee: Secondary | ICD-10-CM | POA: Diagnosis not present

## 2020-10-25 ENCOUNTER — Ambulatory Visit: Payer: Self-pay | Admitting: Physician Assistant

## 2020-10-25 NOTE — H&P (View-Only) (Signed)
TOTAL KNEE ADMISSION H&P  Patient is being admitted for left total knee arthroplasty.  Subjective:  Chief Complaint:left knee pain.  HPI: Veronica Price, 75 y.o. female, has a history of pain and functional disability in the left knee due to arthritis and has failed non-surgical conservative treatments for greater than 12 weeks to includeNSAID's and/or analgesics, corticosteriod injections and activity modification.  Onset of symptoms was gradual, starting 5 years ago with gradually worsening course since that time. The patient noted no past surgery on the left knee(s).  Patient currently rates pain in the left knee(s) at 8 out of 10 with activity. Patient has night pain, worsening of pain with activity and weight bearing, pain that interferes with activities of daily living, pain with passive range of motion, crepitus and joint swelling.  Patient has evidence of periarticular osteophytes and joint space narrowing by imaging studies. There is no active infection.  Patient Active Problem List   Diagnosis Date Noted  . Hypothyroidism (acquired) 10/19/2018  . Other long term (current) drug therapy 10/18/2018  . Encounter for immunotherapy 10/04/2018  . Port-A-Cath in place 01/04/2018  . Malignant neoplasm of upper-outer quadrant of right breast in female, estrogen receptor negative ([facility]) 12/21/2017  . Depression 04/14/2008  . GERD 04/14/2008  . 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 Arthritis 2003  . Breast cancer ([facility]) 11/25/2017   Right breast  . Cataract 2016   "early" per patient-no surgery yet   . GERD (gastroesophageal reflux disease) 2003   per patient report  . Hypothyroidism 10/20/2018    Past Surgical History:  Procedure Laterality Date  . ABDOMINAL HYSTERECTOMY  2002   Total hysterectomy in 2002  . BREAST LUMPECTOMY WITH RADIOACTIVE SEED AND SENTINEL LYMPH NODE BIOPSY Right 06/28/2018   Procedure: BREAST LUMPECTOMY WITH RADIOACTIVE SEED  AND SENTINEL LYMPH NODE BIOPSY;  Surgeon: [name], MD;  Location: [facility];  Service: General;  Laterality: Right;  . PORTACATH PLACEMENT N/A 12/29/2017   Procedure: INSERTION PORT-A-CATH LEFT SUBCLAVIAN;  Surgeon: [name], MD;  Location: [facility];  Service: General;  Laterality: N/A;  . TOTAL KNEE ARTHROPLASTY Right 08/03/2020   Procedure: TOTAL KNEE ARTHROPLASTY;  Surgeon: Earlie Server, MD;  [facility];  Service: Orthopedics;  Laterality: Right;    Current Outpatient Medications  Medication Sig Dispense Refill Last Dose  . acetaminophen (TYLENOL) 500 MG tablet Take 500 mg by mouth every 8 (eight) hours as needed for moderate pain.     Marland Kitchen levothyroxine (SYNTHROID) 50 MCG tablet TAKE 1 TABLET BY MOUTH DAILY BEFORE BREAKFAST (Patient taking differently: Take 50 mcg by mouth daily before breakfast.) 90 tablet 1    No current facility-administered medications for this visit.   Allergies  Allergen Reactions  . Prednisone Swelling and Other (See Comments)    " I think it was this that made my tongue swell "--Patient has received steroid injections without any issues--LC 04/12/2019    Social History   Tobacco Use  . Smoking status: Never Smoker  . Smokeless tobacco: Never Used  Substance Use Topics  . Alcohol use: Never    Family History  Problem Relation Age of Onset  . Diabetes Father   . Heart attack Father      Review of Systems  Cardiovascular: Positive for leg swelling.  Genitourinary: Positive for frequency.  Musculoskeletal: Positive for arthralgias.  Skin: Positive for rash.  Neurological: Positive for headaches.  Hematological: Bruises/bleeds easily.  All other  systems reviewed and are negative.   Objective:  Physical Exam Constitutional:      General: She is not in acute distress.    Appearance: Normal appearance.  HENT:     Head: Normocephalic and atraumatic.  Eyes:     Extraocular Movements: Extraocular movements intact.      Pupils: Pupils are equal, round, and reactive to light.  Cardiovascular:     Rate and Rhythm: Normal rate and regular rhythm.     Pulses: Normal pulses.     Heart sounds: Murmur heard.    Pulmonary:     Effort: Pulmonary effort is normal. No respiratory distress.     Breath sounds: Normal breath sounds.  Abdominal:     General: Abdomen is flat. Bowel sounds are normal. There is no distension.     Palpations: Abdomen is soft.     Tenderness: There is no abdominal tenderness.  Musculoskeletal:     Cervical back: Normal range of motion and neck supple.     Left knee: Swelling, bony tenderness and crepitus present. Normal range of motion. Tenderness present.  Lymphadenopathy:     Cervical: No cervical adenopathy.  Skin:    General: Skin is warm and dry.     Findings: No erythema or rash.  Neurological:     General: No focal deficit present.     Mental Status: She is alert and oriented to person, place, and time.  Psychiatric:        Mood and Affect: Mood normal.        Behavior: Behavior normal.     Vital signs in last 24 hours: @VSRANGES @  Labs:   Estimated body mass index is 29.61 kg/m as calculated from the following:   Height as of 08/16/20: 5\' 2"  (1.575 m).   Weight as of 08/16/20: 73.4 kg.   Imaging Review Plain radiographs demonstrate moderate degenerative joint disease of the left knee(s). The overall alignment ismild varus. The bone quality appears to be good for age and reported activity level.      Assessment/Plan:  End stage arthritis, left knee   The patient history, physical examination, clinical judgment of the provider and imaging studies are consistent with end stage degenerative joint disease of the left knee(s) and total knee arthroplasty is deemed medically necessary. The treatment options including medical management, injection therapy arthroscopy and arthroplasty were discussed at length. The risks and benefits of total knee arthroplasty were  presented and reviewed. The risks due to aseptic loosening, infection, stiffness, patella tracking problems, thromboembolic complications and other imponderables were discussed. The patient acknowledged the explanation, agreed to proceed with the plan and consent was signed. Patient is being admitted for inpatient treatment for surgery, pain control, PT, OT, prophylactic antibiotics, VTE prophylaxis, progressive ambulation and ADL's and discharge planning. The patient is planning to be discharged home with home health services    Anticipated LOS equal to or greater than 2 midnights due to - Age 59 and older with one or more of the following:  - Obesity  - Expected need for hospital services (PT, OT, Nursing) required for safe  discharge  - Anticipated need for postoperative skilled nursing care or inpatient rehab  - Active co-morbidities: None OR   - Unanticipated findings during/Post Surgery: None  - Patient is a high risk of re-admission due to: None

## 2020-10-25 NOTE — H&P (Signed)
TOTAL KNEE ADMISSION H&P  Patient is being admitted for left total knee arthroplasty.  Subjective:  Chief Complaint:left knee pain.  HPI: Veronica Price, 75 y.o. female, has a history of pain and functional disability in the left knee due to arthritis and has failed non-surgical conservative treatments for greater than 12 weeks to includeNSAID's and/or analgesics, corticosteriod injections and activity modification.  Onset of symptoms was gradual, starting 5 years ago with gradually worsening course since that time. The patient noted no past surgery on the left knee(s).  Patient currently rates pain in the left knee(s) at 8 out of 10 with activity. Patient has night pain, worsening of pain with activity and weight bearing, pain that interferes with activities of daily living, pain with passive range of motion, crepitus and joint swelling.  Patient has evidence of periarticular osteophytes and joint space narrowing by imaging studies. There is no active infection.  Patient Active Problem List   Diagnosis Date Noted  . Hypothyroidism (acquired) 10/19/2018  . Other long term (current) drug therapy 10/18/2018  . Encounter for immunotherapy 10/04/2018  . Port-A-Cath in place 01/04/2018  . Malignant neoplasm of upper-outer quadrant of right breast in female, estrogen receptor negative (HCC) 12/21/2017  . Depression 04/14/2008  . GERD 04/14/2008  . 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 Arthritis 2003  . Breast cancer (HCC) 11/25/2017   Right breast  . Cataract 2016   "early" per patient-no surgery yet   . GERD (gastroesophageal reflux disease) 2003   per patient report  . Hypothyroidism 10/20/2018    Past Surgical History:  Procedure Laterality Date  . ABDOMINAL HYSTERECTOMY  2002   Total hysterectomy in 2002  . BREAST LUMPECTOMY WITH RADIOACTIVE SEED AND SENTINEL LYMPH NODE BIOPSY Right 06/28/2018   Procedure: BREAST LUMPECTOMY WITH RADIOACTIVE SEED  AND SENTINEL LYMPH NODE BIOPSY;  Surgeon: Ingram, Haywood, MD;  Location: [facility];  Service: General;  Laterality: Right;  . PORTACATH PLACEMENT N/A 12/29/2017   Procedure: INSERTION PORT-A-CATH LEFT SUBCLAVIAN;  Surgeon: Ingram, Haywood, MD;  Location: [facility];  Service: General;  Laterality: N/A;  . TOTAL KNEE ARTHROPLASTY Right 08/03/2020   Procedure: TOTAL KNEE ARTHROPLASTY;  Surgeon: Caffrey, Daniel, MD;  Location: [facility];  Service: Orthopedics;  Laterality: Right;    Current Outpatient Medications  Medication Sig Dispense Refill Last Dose  . acetaminophen (TYLENOL) 500 MG tablet Take 500 mg by mouth every 8 (eight) hours as needed for moderate pain.     . levothyroxine (SYNTHROID) 50 MCG tablet TAKE 1 TABLET BY MOUTH DAILY BEFORE BREAKFAST (Patient taking differently: Take 50 mcg by mouth daily before breakfast.) 90 tablet 1    No current facility-administered medications for this visit.   Allergies  Allergen Reactions  . Prednisone Swelling and Other (See Comments)    " I think it was this that made my tongue swell "--Patient has received steroid injections without any issues--[facility] 04/12/2019    Social History   Tobacco Use  . Smoking status: Never Smoker  . Smokeless tobacco: Never Used  Substance Use Topics  . Alcohol use: Never    Family History  Problem Relation Age of Onset  . Diabetes Father   . Heart attack Father      Review of Systems  Cardiovascular: Positive for leg swelling.  Genitourinary: Positive for frequency.  Musculoskeletal: Positive for arthralgias.  Skin: Positive for rash.  Neurological: Positive for headaches.  Hematological: Bruises/bleeds easily.  All other   systems reviewed and are negative.   Objective:  Physical Exam Constitutional:      General: She is not in acute distress.    Appearance: Normal appearance.  HENT:     Head: Normocephalic and atraumatic.  Eyes:     Extraocular Movements: Extraocular movements intact.      Pupils: Pupils are equal, round, and reactive to light.  Cardiovascular:     Rate and Rhythm: Normal rate and regular rhythm.     Pulses: Normal pulses.     Heart sounds: Murmur heard.    Pulmonary:     Effort: Pulmonary effort is normal. No respiratory distress.     Breath sounds: Normal breath sounds.  Abdominal:     General: Abdomen is flat. Bowel sounds are normal. There is no distension.     Palpations: Abdomen is soft.     Tenderness: There is no abdominal tenderness.  Musculoskeletal:     Cervical back: Normal range of motion and neck supple.     Left knee: Swelling, bony tenderness and crepitus present. Normal range of motion. Tenderness present.  Lymphadenopathy:     Cervical: No cervical adenopathy.  Skin:    General: Skin is warm and dry.     Findings: No erythema or rash.  Neurological:     General: No focal deficit present.     Mental Status: She is alert and oriented to person, place, and time.  Psychiatric:        Mood and Affect: Mood normal.        Behavior: Behavior normal.     Vital signs in last 24 hours: @VSRANGES @  Labs:   Estimated body mass index is 29.61 kg/m as calculated from the following:   Height as of 08/16/20: 5\' 2"  (1.575 m).   Weight as of 08/16/20: 73.4 kg.   Imaging Review Plain radiographs demonstrate moderate degenerative joint disease of the left knee(s). The overall alignment ismild varus. The bone quality appears to be good for age and reported activity level.      Assessment/Plan:  End stage arthritis, left knee   The patient history, physical examination, clinical judgment of the provider and imaging studies are consistent with end stage degenerative joint disease of the left knee(s) and total knee arthroplasty is deemed medically necessary. The treatment options including medical management, injection therapy arthroscopy and arthroplasty were discussed at length. The risks and benefits of total knee arthroplasty were  presented and reviewed. The risks due to aseptic loosening, infection, stiffness, patella tracking problems, thromboembolic complications and other imponderables were discussed. The patient acknowledged the explanation, agreed to proceed with the plan and consent was signed. Patient is being admitted for inpatient treatment for surgery, pain control, PT, OT, prophylactic antibiotics, VTE prophylaxis, progressive ambulation and ADL's and discharge planning. The patient is planning to be discharged home with home health services    Anticipated LOS equal to or greater than 2 midnights due to - Age 59 and older with one or more of the following:  - Obesity  - Expected need for hospital services (PT, OT, Nursing) required for safe  discharge  - Anticipated need for postoperative skilled nursing care or inpatient rehab  - Active co-morbidities: None OR   - Unanticipated findings during/Post Surgery: None  - Patient is a high risk of re-admission due to: None

## 2020-10-25 NOTE — Patient Instructions (Addendum)
DUE TO COVID-19 ONLY ONE VISITOR IS ALLOWED TO COME WITH YOU AND STAY IN THE WAITING ROOM ONLY DURING PRE OP AND PROCEDURE DAY OF SURGERY. THE 1 VISITOR  MAY VISIT WITH YOU AFTER SURGERY IN YOUR PRIVATE ROOM DURING VISITING HOURS ONLY!  YOU NEED TO HAVE A COVID 19 TEST ON_2/1______ @_______ , THIS TEST MUST BE DONE BEFORE SURGERY,  COVID TESTING SITE [address] [place] [address], IT IS ON THE RIGHT GOING OUT [address] APPROXIMATELY  2 MINUTES PAST [employer] ON THE RIGHT. ONCE YOUR COVID TEST IS COMPLETED,  PLEASE BEGIN THE QUARANTINE INSTRUCTIONS AS OUTLINED IN YOUR HANDOUT.                Veronica Price    Your procedure is scheduled on: 11/09/20   Report to [facility] Main  Entrance   Report to Short Stay at 5:30 AM     Call this number if you have problems the morning of surgery [place], NO CHEWING GUM [place].   No food after midnight.    You may have clear liquid until 4:30 AM.    At 4:00 AM drink pre surgery drink  . Nothing by mouth after 4:30 AM.   Take these medicines the morning of surgery with A SIP OF WATER: Levothyroxine                               You may not have any metal on your body including hair pins and              piercings  Do not wear jewelry, make-up, lotions, powders or perfumes, deodorant             Do not wear nail polish on your fingernails.  Do not shave  48 hours prior to surgery.     Do not bring valuables to the hospital. [place].  Contacts, dentures or bridgework may not be worn into surgery.      Patients discharged the day of surgery will not be allowed to drive home.   IF YOU ARE HAVING SURGERY AND GOING HOME THE SAME DAY, YOU MUST HAVE AN ADULT TO DRIVE YOU HOME AND BE WITH YOU FOR 24 HOURS.   YOU MAY GO HOME BY TAXI OR UBER OR ORTHERWISE, BUT AN ADULT MUST ACCOMPANY  YOU HOME AND STAY WITH YOU FOR 24 HOURS.  Name and phone number of your driver:  Special Instructions: N/A              Please read over the following fact sheets you were given: _____________________________________________________________________             [facility] - Preparing for Surgery Before surgery, you can play an important role.  Because skin is not sterile, your skin needs to be as free of germs as possible.  You can reduce the number of germs on your skin by washing with CHG (chlorahexidine gluconate) soap before surgery.  CHG is an antiseptic cleaner which kills germs and bonds with the skin to continue killing germs even after washing. Please DO NOT use if you have an allergy to CHG or antibacterial soaps.   If your skin becomes reddened/irritated  stop using the CHG and inform your nurse when you arrive at Short Stay. Do not shave (including legs and underarms) for at least 48 hours prior to the first CHG shower.    Please follow these instructions carefully:  1.  Shower with CHG Soap the night before surgery and the  morning of Surgery.  2.  If you choose to wash your hair, wash your hair first as usual with your  normal  shampoo.  3.  After you shampoo, rinse your hair and body thoroughly to remove the  shampoo.                                        4.  Use CHG as you would any other liquid soap.  You can apply chg directly  to the skin and wash                       Gently with a scrungie or clean washcloth.  5.  Apply the CHG Soap to your body ONLY FROM THE NECK DOWN.   Do not use on face/ open                           Wound or open sores. Avoid contact with eyes, ears mouth and genitals (private parts).                       Wash face,  Genitals (private parts) with your normal soap.             6.  Wash thoroughly, paying special attention to the area where your surgery  will be performed.  7.  Thoroughly rinse your body with warm water from the neck down.  8.  DO  NOT shower/wash with your normal soap after using and rinsing off  the CHG Soap.              9.  Pat yourself dry with a clean towel.            10.  Wear clean pajamas.            11.  Place clean sheets on your bed the night of your first shower and do not  sleep with pets. Day of Surgery : Do not apply any lotions/deodorants the morning of surgery.  Please wear clean clothes to the hospital/surgery center.  FAILURE TO FOLLOW THESE INSTRUCTIONS MAY RESULT IN THE CANCELLATION OF YOUR SURGERY PATIENT SIGNATURE_________________________________  NURSE SIGNATURE__________________________________  ________________________________________________________________________   Veronica Price  An incentive spirometer is a tool that can help keep your lungs clear and active. This tool measures how well you are filling your lungs with each breath. Taking long deep breaths may help reverse or decrease the chance of developing breathing (pulmonary) problems (especially infection) following:  A long period of time when you are unable to move or be active. BEFORE THE PROCEDURE   If the spirometer includes an indicator to show your best effort, your nurse or respiratory therapist will set it to a desired goal.  If possible, sit up straight or lean slightly forward. Try not to slouch.  Hold the incentive spirometer in an upright position. INSTRUCTIONS FOR USE  1. Sit on the edge of your bed if possible, or sit up as far as you can in bed  or on a chair. 2. Hold the incentive spirometer in an upright position. 3. Breathe out normally. 4. Place the mouthpiece in your mouth and seal your lips tightly around it. 5. Breathe in slowly and as deeply as possible, raising the piston or the ball toward the top of the column. 6. Hold your breath for 3-5 seconds or for as long as possible. Allow the piston or ball to fall to the bottom of the column. 7. Remove the mouthpiece from your mouth and breathe out  normally. 8. Rest for a few seconds and repeat Steps 1 through 7 at least 10 times every 1-2 hours when you are awake. Take your time and take a few normal breaths between deep breaths. 9. The spirometer may include an indicator to show your best effort. Use the indicator as a goal to work toward during each repetition. 10. After each set of 10 deep breaths, practice coughing to be sure your lungs are clear. If you have an incision (the cut made at the time of surgery), support your incision when coughing by placing a pillow or rolled up towels firmly against it. Once you are able to get out of bed, walk around indoors and cough well. You may stop using the incentive spirometer when instructed by your caregiver.  RISKS AND COMPLICATIONS  Take your time so you do not get dizzy or light-headed.  If you are in pain, you may need to take or ask for pain medication before doing incentive spirometry. It is harder to take a deep breath if you are having pain. AFTER USE  Rest and breathe slowly and easily.  It can be helpful to keep track of a log of your progress. Your caregiver can provide you with a simple table to help with this. If you are using the spirometer at home, follow these instructions: [place] IF:   You are having difficultly using the spirometer.  You have trouble using the spirometer as often as instructed.  Your pain medication is not giving enough relief while using the spirometer.  You develop fever of 100.5 F (38.1 C) or higher. SEEK IMMEDIATE MEDICAL CARE IF:   You cough up bloody sputum that had not been present before.  You develop fever of 102 F (38.9 C) or greater.  You develop worsening pain at or near the incision site. MAKE SURE YOU:   Understand these instructions.  Will watch your condition.  Will get help right away if you are not doing well or get worse. Document Released: 02/02/2007 Document Revised: 12/15/2011 Document Reviewed:  04/05/2007 [facility] Patient Information 2014 [place], [place].   ________________________________________________________________________

## 2020-10-29 ENCOUNTER — Encounter (HOSPITAL_COMMUNITY)
Admission: RE | Admit: 2020-10-29 | Discharge: 2020-10-29 | Disposition: A | Discharge status: Home / Self Care | Payer: Medicare Other | Source: Ambulatory Visit | Attending: Nurse Practitioner | Admitting: Nurse Practitioner

## 2020-10-29 NOTE — Progress Notes (Signed)
Pt didn't come to her PAT appointment. She was confussed about the day. I gave it to the scheduler to be re scheduled.

## 2020-10-30 NOTE — Care Plan (Signed)
Ortho Bundle Case Management Note  Patient Details  Name: Veronica Price MRN: [id number] Date of Birth: 1946/08/25   Met with patient and husband in the office prior to surgery. She will discharge to home with him to assist. [facility] has a rolling walker. CPM ordered. HHPT referral to Horris Latino at [facility] in [place] [phone] and [place] set up at [facility] PT and IR. Patient and MD in agreement with plan. Choice offered                   DME Arranged:  CPM DME Agency:  [facility]  HH Arranged:  PT HH Agency:  [facility]  Additional Comments: Please contact me with any questions of if this plan should need to change.  Ladell Heads,  [place] [facility]opaedic Specialist  [phone] 10/30/2020, 1:02 PM

## 2020-11-05 NOTE — Patient Instructions (Signed)
DUE TO COVID-19 ONLY ONE VISITOR IS ALLOWED TO COME WITH YOU AND STAY IN THE WAITING ROOM ONLY DURING PRE OP AND PROCEDURE DAY OF SURGERY. THE 1 VISITOR  MAY VISIT WITH YOU AFTER SURGERY IN YOUR PRIVATE ROOM DURING VISITING HOURS ONLY!  YOU NEED TO HAVE A COVID 19 TEST ON: 11/06/20 @ 11:00 AM, THIS TEST MUST BE DONE BEFORE SURGERY,  COVID TESTING SITE [place] [id number], IT IS ON THE RIGHT GOING OUT [address] APPROXIMATELY  2 MINUTES PAST [employer] ON THE RIGHT. ONCE YOUR COVID TEST IS COMPLETED,  PLEASE BEGIN THE QUARANTINE INSTRUCTIONS AS OUTLINED IN YOUR HANDOUT.                [name]   Your procedure is scheduled on:  11/09/20   Report to [facility] Main  Entrance   Report to short stay at: 5:30 AM     Call this number if you have problems the morning of surgery [phone]    Remember:   NO SOLID FOOD AFTER MIDNIGHT THE NIGHT PRIOR TO SURGERY. NOTHING BY MOUTH EXCEPT CLEAR LIQUIDS UNTIL: 4:30 AM . PLEASE FINISH ENSURE DRINK PER SURGEON ORDER  WHICH NEEDS TO BE COMPLETED AT: 4:30 AM .  CLEAR LIQUID DIET  Foods Allowed                                                                     Foods Excluded  Coffee and tea, regular and decaf                             liquids that you cannot  Plain Jell-O any favor except red or purple                                           see through such as: Fruit ices (not with fruit pulp)                                     milk, soups, orange juice  Iced Popsicles                                    All solid food Carbonated beverages, regular and diet                                    Cranberry, grape and apple juices Sports drinks like Gatorade Lightly seasoned clear broth or consume(fat free) Sugar, honey syrup  Sample Menu Breakfast                                Lunch  Supper Cranberry juice                    Beef broth                             Chicken broth Jell-O                                     Grape juice                           Apple juice Coffee or tea                        Jell-O                                      Popsicle                                                Coffee or tea                        Coffee or tea  _____________________________________________________________________   BRUSH YOUR TEETH MORNING OF SURGERY AND RINSE YOUR MOUTH OUT, NO CHEWING GUM CANDY OR MINTS.    Take these medicines the morning of surgery with A SIP OF WATER: synthroid.                               You may not have any metal on your body including hair pins and              piercings  Do not wear jewelry, make-up, lotions, powders or perfumes, deodorant             Do not wear nail polish on your fingernails.  Do not shave  48 hours prior to surgery.    Do not bring valuables to the hospital. [place].  Contacts, dentures or bridgework may not be worn into surgery.  Leave suitcase in the car. After surgery it may be brought to your room.     Patients discharged the day of surgery will not be allowed to drive home. IF YOU ARE HAVING SURGERY AND GOING HOME THE SAME DAY, YOU MUST HAVE AN ADULT TO DRIVE YOU HOME AND BE WITH YOU FOR 24 HOURS. YOU MAY GO HOME BY TAXI OR UBER OR ORTHERWISE, BUT AN ADULT MUST ACCOMPANY YOU HOME AND STAY WITH YOU FOR 24 HOURS.  Name and phone number of your driver:  Special Instructions: N/A              Please read over the following fact sheets you were given: _____________________________________________________________________         [facility] - Preparing for Surgery Before surgery, you can play an important role.  Because skin is not sterile, your skin needs to be as free of germs as possible.  You can  reduce the number of germs on your skin by washing with CHG (chlorahexidine gluconate) soap before surgery.  CHG is an antiseptic cleaner  which kills germs and bonds with the skin to continue killing germs even after washing. Please DO NOT use if you have an allergy to CHG or antibacterial soaps.  If your skin becomes reddened/irritated stop using the CHG and inform your nurse when you arrive at Short Stay. Do not shave (including legs and underarms) for at least 48 hours prior to the first CHG shower.  You may shave your face/neck. Please follow these instructions carefully:  1.  Shower with CHG Soap the night before surgery and the  morning of Surgery.  2.  If you choose to wash your hair, wash your hair first as usual with your  normal  shampoo.  3.  After you shampoo, rinse your hair and body thoroughly to remove the  shampoo.                           4.  Use CHG as you would any other liquid soap.  You can apply chg directly  to the skin and wash                       Gently with a scrungie or clean washcloth.  5.  Apply the CHG Soap to your body ONLY FROM THE NECK DOWN.   Do not use on face/ open                           Wound or open sores. Avoid contact with eyes, ears mouth and genitals (private parts).                       Wash face,  Genitals (private parts) with your normal soap.             6.  Wash thoroughly, paying special attention to the area where your surgery  will be performed.  7.  Thoroughly rinse your body with warm water from the neck down.  8.  DO NOT shower/wash with your normal soap after using and rinsing off  the CHG Soap.                9.  Pat yourself dry with a clean towel.            10.  Wear clean pajamas.            11.  Place clean sheets on your bed the night of your first shower and do not  sleep with pets. Day of Surgery : Do not apply any lotions/deodorants the morning of surgery.  Please wear clean clothes to the hospital/surgery center.  FAILURE TO FOLLOW THESE INSTRUCTIONS MAY RESULT IN THE CANCELLATION OF YOUR SURGERY PATIENT SIGNATURE_________________________________  NURSE  SIGNATURE__________________________________  ________________________________________________________________________   Adam Phenix  An incentive spirometer is a tool that can help keep your lungs clear and active. This tool measures how well you are filling your lungs with each breath. Taking long deep breaths may help reverse or decrease the chance of developing breathing (pulmonary) problems (especially infection) following:  A long period of time when you are unable to move or be active. BEFORE THE PROCEDURE   If the spirometer includes an indicator to show your best effort, your nurse or respiratory therapist will set it  to a desired goal.  If possible, sit up straight or lean slightly forward. Try not to slouch.  Hold the incentive spirometer in an upright position. INSTRUCTIONS FOR USE  1. Sit on the edge of your bed if possible, or sit up as far as you can in bed or on a chair. 2. Hold the incentive spirometer in an upright position. 3. Breathe out normally. 4. Place the mouthpiece in your mouth and seal your lips tightly around it. 5. Breathe in slowly and as deeply as possible, raising the piston or the ball toward the top of the column. 6. Hold your breath for 3-5 seconds or for as long as possible. Allow the piston or ball to fall to the bottom of the column. 7. Remove the mouthpiece from your mouth and breathe out normally. 8. Rest for a few seconds and repeat Steps 1 through 7 at least 10 times every 1-2 hours when you are awake. Take your time and take a few normal breaths between deep breaths. 9. The spirometer may include an indicator to show your best effort. Use the indicator as a goal to work toward during each repetition. 10. After each set of 10 deep breaths, practice coughing to be sure your lungs are clear. If you have an incision (the cut made at the time of surgery), support your incision when coughing by placing a pillow or rolled up towels firmly  against it. Once you are able to get out of bed, walk around indoors and cough well. You may stop using the incentive spirometer when instructed by your caregiver.  RISKS AND COMPLICATIONS  Take your time so you do not get dizzy or light-headed.  If you are in pain, you may need to take or ask for pain medication before doing incentive spirometry. It is harder to take a deep breath if you are having pain. AFTER USE  Rest and breathe slowly and easily.  It can be helpful to keep track of a log of your progress. Your caregiver can provide you with a simple table to help with this. If you are using the spirometer at home, follow these instructions: [place] IF:   You are having difficultly using the spirometer.  You have trouble using the spirometer as often as instructed.  Your pain medication is not giving enough relief while using the spirometer.  You develop fever of 100.5 F (38.1 C) or higher. SEEK IMMEDIATE MEDICAL CARE IF:   You cough up bloody sputum that had not been present before.  You develop fever of 102 F (38.9 C) or greater.  You develop worsening pain at or near the incision site. MAKE SURE YOU:   Understand these instructions.  Will watch your condition.  Will get help right away if you are not doing well or get worse. Document Released: 02/02/2007 Document Revised: 12/15/2011 Document Reviewed: 04/05/2007 [facility] Patient Information 2014 [place], [place].   ________________________________________________________________________

## 2020-11-06 ENCOUNTER — Encounter (HOSPITAL_COMMUNITY)
Admission: RE | Admit: 2020-11-06 | Discharge: 2020-11-06 | Disposition: A | Discharge status: Home / Self Care | Payer: Medicare Other | Source: Ambulatory Visit | Attending: Orthopedic Surgery | Admitting: Orthopedic Surgery

## 2020-11-06 ENCOUNTER — Encounter (HOSPITAL_COMMUNITY): Payer: Self-pay

## 2020-11-06 ENCOUNTER — Other Ambulatory Visit (HOSPITAL_COMMUNITY)
Admission: RE | Admit: 2020-11-06 | Discharge: 2020-11-06 | Disposition: A | Discharge status: Home / Self Care | Payer: Medicare Other | Source: Ambulatory Visit | Attending: Orthopedic Surgery | Admitting: Orthopedic Surgery

## 2020-11-06 ENCOUNTER — Other Ambulatory Visit: Payer: Self-pay

## 2020-11-06 DIAGNOSIS — Z01812 Encounter for preprocedural laboratory examination: Secondary | ICD-10-CM | POA: Diagnosis not present

## 2020-11-06 DIAGNOSIS — Z20822 Contact with and (suspected) exposure to covid-19: Secondary | ICD-10-CM | POA: Insufficient documentation

## 2020-11-06 LAB — URINALYSIS, ROUTINE W REFLEX MICROSCOPIC
Bilirubin Urine: NEGATIVE
Glucose, UA: NEGATIVE mg/dL
Ketones, ur: NEGATIVE mg/dL
Nitrite: POSITIVE — AB
Protein, ur: NEGATIVE mg/dL
Specific Gravity, Urine: 1.005 (ref 1.005–1.030)
pH: 6 (ref 5.0–8.0)

## 2020-11-06 LAB — COMPREHENSIVE METABOLIC PANEL
ALT: 14 U/L (ref 0–44)
AST: 16 U/L (ref 15–41)
Albumin: 4.2 g/dL (ref 3.5–5.0)
Alkaline Phosphatase: 70 U/L (ref 38–126)
Anion gap: 9 (ref 5–15)
BUN: 13 mg/dL (ref 8–23)
CO2: 27 mmol/L (ref 22–32)
Calcium: 9.8 mg/dL (ref 8.9–10.3)
Chloride: 102 mmol/L (ref 98–111)
Creatinine, Ser: 0.9 mg/dL (ref 0.44–1.00)
GFR, Estimated: >60 mL/min (ref >60)
Glucose, Bld: 98 mg/dL (ref 70–99)
Potassium: 4.6 mmol/L (ref 3.5–5.1)
Sodium: 138 mmol/L (ref 135–145)
Total Bilirubin: 0.7 mg/dL (ref 0.3–1.2)
Total Protein: 8.1 g/dL (ref 6.5–8.1)

## 2020-11-06 LAB — CBC WITH DIFFERENTIAL/PLATELET
Abs Immature Granulocytes: 0.01 10*3/uL (ref 0.00–0.07)
Basophils Absolute: 0 10*3/uL (ref 0.0–0.1)
Basophils Relative: 1 %
Eosinophils Absolute: 0.1 10*3/uL (ref 0.0–0.5)
Eosinophils Relative: 2 %
HCT: 41.5 % (ref 36.0–46.0)
Hemoglobin: 13.6 g/dL (ref 12.0–15.0)
Immature Granulocytes: 0 %
Lymphocytes Relative: 22 %
Lymphs Abs: 1 10*3/uL (ref 0.7–4.0)
MCH: 32.9 pg (ref 26.0–34.0)
MCHC: 32.8 g/dL (ref 30.0–36.0)
MCV: 100.5 fL — ABNORMAL HIGH (ref 80.0–100.0)
Monocytes Absolute: 0.6 10*3/uL (ref 0.1–1.0)
Monocytes Relative: 14 %
Neutro Abs: 2.8 10*3/uL (ref 1.7–7.7)
Neutrophils Relative %: 61 %
Platelets: 221 10*3/uL (ref 150–400)
RBC: 4.13 MIL/uL (ref 3.87–5.11)
RDW: 13.5 % (ref 11.5–15.5)
WBC: 4.6 10*3/uL (ref 4.0–10.5)
nRBC: 0 % (ref 0.0–0.2)

## 2020-11-06 LAB — SURGICAL PCR SCREEN
MRSA, PCR: NEGATIVE
Staphylococcus aureus: NEGATIVE

## 2020-11-06 LAB — PROTIME-INR
INR: 0.9 (ref 0.8–1.2)
Prothrombin Time: 11.9 seconds (ref 11.4–15.2)

## 2020-11-06 LAB — SARS CORONAVIRUS 2 (TAT 6-24 HRS): SARS Coronavirus 2: NEGATIVE

## 2020-11-06 LAB — APTT: aPTT: 26 seconds (ref 24–36)

## 2020-11-06 NOTE — Progress Notes (Signed)
Lab. Results: UA: moderate Hgb,positive nitrite,Small leukocytes,rare bacteria.

## 2020-11-06 NOTE — Progress Notes (Signed)
COVID Vaccine Completed: Date COVID Vaccine completed: COVID vaccine manufacturer: [employer]   PCP - Orie Rout. : FNP Cardiologist -   Chest x-ray -  EKG - 07/23/20 Stress Test -  ECHO - 04/02/18 Cardiac Cath -  Pacemaker/ICD device last checked:  Sleep Study -  CPAP -   Fasting Blood Sugar -  Checks Blood Sugar _____ times a day  Blood Thinner Instructions: Aspirin Instructions: Last Dose:  Anesthesia review:   Patient denies shortness of breath, fever, cough and chest pain at PAT appointment   Patient verbalized understanding of instructions that were given to them at the PAT appointment. Patient was also instructed that they will need to review over the PAT instructions again at home before surgery.

## 2020-11-08 LAB — URINE CULTURE: Culture: >=100000 — AB

## 2020-11-08 MED ORDER — BUPIVACAINE LIPOSOME 1.3 % IJ SUSP
20.0000 mL | Freq: Once | INTRAMUSCULAR | Status: DC
  Filled 2020-11-08: qty 20

## 2020-11-08 MED ORDER — TRANEXAMIC ACID 1000 MG/10ML IV SOLN
2000.0000 mg | INTRAVENOUS | Status: DC
  Filled 2020-11-08: qty 20

## 2020-11-08 NOTE — Anesthesia Preprocedure Evaluation (Addendum)
Anesthesia Evaluation  Patient identified by MRN, date of birth, ID band Patient awake    Reviewed: Allergy & Precautions, H&P , NPO status , Patient's Chart, lab work & pertinent test results  Airway Mallampati: I  TM Distance: >3 FB Neck ROM: Full    Dental no notable dental hx. (+) Edentulous Upper, Edentulous Lower, Dental Advisory Given   Pulmonary neg pulmonary ROS,    Pulmonary exam normal breath sounds clear to auscultation       Cardiovascular Exercise Tolerance: Good negative cardio ROS   Rhythm:Regular Rate:Normal     Neuro/Psych Depression negative neurological ROS     GI/Hepatic Neg liver ROS, GERD  ,  Endo/Other  Hypothyroidism   Renal/GU negative Renal ROS  negative genitourinary   Musculoskeletal  (+) Arthritis , Osteoarthritis,    Abdominal   Peds  Hematology negative hematology ROS (+)   Anesthesia Other Findings   Reproductive/Obstetrics negative OB ROS                            Anesthesia Physical Anesthesia Plan  ASA: II  Anesthesia Plan: Spinal   Post-op Pain Management:  Regional for Post-op pain   Induction: Intravenous  PONV Risk Score and Plan: 3 and Ondansetron, Dexamethasone, Propofol infusion and Midazolam  Airway Management Planned: Simple Face Mask  Additional Equipment:   Intra-op Plan:   Post-operative Plan:   Informed Consent: I have reviewed the patients History and Physical, chart, labs and discussed the procedure including the risks, benefits and alternatives for the proposed anesthesia with the patient or authorized representative who has indicated his/her understanding and acceptance.     Dental advisory given  Plan Discussed with: CRNA  Anesthesia Plan Comments:        Anesthesia Quick Evaluation

## 2020-11-09 ENCOUNTER — Encounter (HOSPITAL_COMMUNITY)
Admission: RE | Disposition: A | Discharge status: Home / Self Care | Payer: Self-pay | Source: Home / Self Care | Attending: Orthopedic Surgery

## 2020-11-09 ENCOUNTER — Encounter (HOSPITAL_COMMUNITY): Payer: Self-pay | Admitting: Orthopedic Surgery

## 2020-11-09 ENCOUNTER — Ambulatory Visit (HOSPITAL_COMMUNITY): Payer: Medicare Other | Admitting: Anesthesiology

## 2020-11-09 ENCOUNTER — Ambulatory Visit (HOSPITAL_COMMUNITY)
Admission: RE | Admit: 2020-11-09 | Discharge: 2020-11-09 | Disposition: A | Discharge status: Home / Self Care | Payer: Medicare Other | Attending: Orthopedic Surgery | Admitting: Orthopedic Surgery

## 2020-11-09 DIAGNOSIS — Z853 Personal history of malignant neoplasm of breast: Secondary | ICD-10-CM | POA: Insufficient documentation

## 2020-11-09 DIAGNOSIS — Z96651 Presence of right artificial knee joint: Secondary | ICD-10-CM | POA: Diagnosis not present

## 2020-11-09 DIAGNOSIS — Z79890 Hormone replacement therapy (postmenopausal): Secondary | ICD-10-CM | POA: Diagnosis not present

## 2020-11-09 DIAGNOSIS — M21162 Varus deformity, not elsewhere classified, left knee: Secondary | ICD-10-CM | POA: Diagnosis not present

## 2020-11-09 DIAGNOSIS — M1712 Unilateral primary osteoarthritis, left knee: Secondary | ICD-10-CM | POA: Diagnosis not present

## 2020-11-09 DIAGNOSIS — M21152 Varus deformity, not elsewhere classified, left hip: Secondary | ICD-10-CM | POA: Diagnosis not present

## 2020-11-09 DIAGNOSIS — G8918 Other acute postprocedural pain: Secondary | ICD-10-CM | POA: Diagnosis not present

## 2020-11-09 DIAGNOSIS — F32A Depression, unspecified: Secondary | ICD-10-CM | POA: Diagnosis not present

## 2020-11-09 HISTORY — PX: TOTAL KNEE ARTHROPLASTY: SHX125

## 2020-11-09 LAB — CBC
HCT: 36.6 % (ref 36.0–46.0)
Hemoglobin: 11.8 g/dL — ABNORMAL LOW (ref 12.0–15.0)
MCH: 32.4 pg (ref 26.0–34.0)
MCHC: 32.2 g/dL (ref 30.0–36.0)
MCV: 100.5 fL — ABNORMAL HIGH (ref 80.0–100.0)
Platelets: 180 10*3/uL (ref 150–400)
RBC: 3.64 MIL/uL — ABNORMAL LOW (ref 3.87–5.11)
RDW: 13.4 % (ref 11.5–15.5)
WBC: 4.3 10*3/uL (ref 4.0–10.5)
nRBC: 0 % (ref 0.0–0.2)

## 2020-11-09 LAB — TYPE AND SCREEN
ABO/RH(D): A POS
Antibody Screen: NEGATIVE

## 2020-11-09 SURGERY — ARTHROPLASTY, KNEE, TOTAL
Anesthesia: Spinal | Site: Knee | Laterality: Left

## 2020-11-09 MED ORDER — BUPIVACAINE LIPOSOME 1.3 % IJ SUSP
INTRAMUSCULAR | Status: DC | PRN
  Administered 2020-11-09: 20 mL

## 2020-11-09 MED ORDER — BUPIVACAINE-EPINEPHRINE (PF) 0.5% -1:200000 IJ SOLN
INTRAMUSCULAR | Status: DC | PRN
  Administered 2020-11-09: 20 mL via PERINEURAL

## 2020-11-09 MED ORDER — BUPIVACAINE-EPINEPHRINE (PF) 0.25% -1:200000 IJ SOLN
INTRAMUSCULAR | Status: DC | PRN
  Administered 2020-11-09: 30 mL via PERINEURAL

## 2020-11-09 MED ORDER — SODIUM CHLORIDE 0.9% FLUSH
INTRAVENOUS | Status: DC | PRN
  Administered 2020-11-09: 50 mL

## 2020-11-09 MED ORDER — TRANEXAMIC ACID-NACL 1000-0.7 MG/100ML-% IV SOLN
1000.0000 mg | Freq: Once | INTRAVENOUS | Status: AC
  Administered 2020-11-09: 1000 mg via INTRAVENOUS

## 2020-11-09 MED ORDER — ACETAMINOPHEN 500 MG PO TABS
1000.0000 mg | ORAL_TABLET | Freq: Once | ORAL | Status: AC
  Administered 2020-11-09: 1000 mg via ORAL
  Filled 2020-11-09: qty 2

## 2020-11-09 MED ORDER — FENTANYL CITRATE (PF) 100 MCG/2ML IJ SOLN
INTRAMUSCULAR | Status: DC | PRN
  Administered 2020-11-09: 1 ug via INTRAVENOUS

## 2020-11-09 MED ORDER — SODIUM CHLORIDE 0.9 % IV SOLN: INTRAVENOUS | Status: DC

## 2020-11-09 MED ORDER — ORAL CARE MOUTH RINSE: 15.0000 mL | Freq: Once | OROMUCOSAL | Status: AC

## 2020-11-09 MED ORDER — PROPOFOL 1000 MG/100ML IV EMUL
INTRAVENOUS | Status: AC
  Filled 2020-11-09: qty 100

## 2020-11-09 MED ORDER — DEXAMETHASONE SODIUM PHOSPHATE 10 MG/ML IJ SOLN
INTRAMUSCULAR | Status: DC | PRN
  Administered 2020-11-09: 5 mg via INTRAVENOUS

## 2020-11-09 MED ORDER — CELECOXIB 200 MG PO CAPS: 200.0000 mg | ORAL_CAPSULE | Freq: Two times a day (BID) | ORAL | 0 refills | Status: DC

## 2020-11-09 MED ORDER — OXYCODONE HCL 5 MG PO TABS: ORAL_TABLET | ORAL | 0 refills | Status: DC

## 2020-11-09 MED ORDER — ONDANSETRON HCL 4 MG/2ML IJ SOLN
INTRAMUSCULAR | Status: AC
  Filled 2020-11-09: qty 2

## 2020-11-09 MED ORDER — OXYCODONE HCL 5 MG PO TABS
ORAL_TABLET | ORAL | Status: AC
  Filled 2020-11-09: qty 1

## 2020-11-09 MED ORDER — MIDAZOLAM HCL 5 MG/5ML IJ SOLN
INTRAMUSCULAR | Status: DC | PRN
  Administered 2020-11-09: 1 mg via INTRAVENOUS

## 2020-11-09 MED ORDER — PROPOFOL 10 MG/ML IV BOLUS
INTRAVENOUS | Status: DC | PRN
  Administered 2020-11-09: 20 mg via INTRAVENOUS

## 2020-11-09 MED ORDER — PHENYLEPHRINE 40 MCG/ML (10ML) SYRINGE FOR IV PUSH (FOR BLOOD PRESSURE SUPPORT)
PREFILLED_SYRINGE | INTRAVENOUS | Status: AC
  Filled 2020-11-09: qty 10

## 2020-11-09 MED ORDER — TRANEXAMIC ACID 1000 MG/10ML IV SOLN
INTRAVENOUS | Status: DC | PRN
  Administered 2020-11-09: 2000 mg via TOPICAL

## 2020-11-09 MED ORDER — PROPOFOL 500 MG/50ML IV EMUL
INTRAVENOUS | Status: DC | PRN
  Administered 2020-11-09: 75 ug/kg/min via INTRAVENOUS

## 2020-11-09 MED ORDER — PHENYLEPHRINE 40 MCG/ML (10ML) SYRINGE FOR IV PUSH (FOR BLOOD PRESSURE SUPPORT)
PREFILLED_SYRINGE | INTRAVENOUS | Status: DC | PRN
  Administered 2020-11-09: 80 ug via INTRAVENOUS
  Administered 2020-11-09: 160 ug via INTRAVENOUS

## 2020-11-09 MED ORDER — CHLORHEXIDINE GLUCONATE 0.12 % MT SOLN
15.0000 mL | Freq: Once | OROMUCOSAL | Status: AC
  Administered 2020-11-09: 15 mL via OROMUCOSAL

## 2020-11-09 MED ORDER — LACTATED RINGERS IV BOLUS
250.0000 mL | Freq: Once | INTRAVENOUS | Status: AC
  Administered 2020-11-09: 250 mL via INTRAVENOUS

## 2020-11-09 MED ORDER — LACTATED RINGERS IV BOLUS
500.0000 mL | Freq: Once | INTRAVENOUS | Status: AC
  Administered 2020-11-09: 500 mL via INTRAVENOUS

## 2020-11-09 MED ORDER — CEFAZOLIN SODIUM-DEXTROSE 2-4 GM/100ML-% IV SOLN
2.0000 g | INTRAVENOUS | Status: AC
  Administered 2020-11-09: 2 g via INTRAVENOUS
  Filled 2020-11-09: qty 100

## 2020-11-09 MED ORDER — SODIUM CHLORIDE 0.9 % IR SOLN
Status: DC | PRN
  Administered 2020-11-09: 1000 mL

## 2020-11-09 MED ORDER — BUPIVACAINE IN DEXTROSE 0.75-8.25 % IT SOLN
INTRATHECAL | Status: DC | PRN
Start: 2020-11-09 — End: 2020-11-09
  Administered 2020-11-09: 1.8 mL via INTRATHECAL

## 2020-11-09 MED ORDER — CEFAZOLIN SODIUM-DEXTROSE 1-4 GM/50ML-% IV SOLN: 1.0000 g | Freq: Four times a day (QID) | INTRAVENOUS | Status: DC

## 2020-11-09 MED ORDER — ASPIRIN EC 81 MG PO TBEC: 81.0000 mg | DELAYED_RELEASE_TABLET | Freq: Two times a day (BID) | ORAL | 0 refills | Status: AC

## 2020-11-09 MED ORDER — LACTATED RINGERS IV SOLN: INTRAVENOUS | Status: DC

## 2020-11-09 MED ORDER — TRANEXAMIC ACID-NACL 1000-0.7 MG/100ML-% IV SOLN
INTRAVENOUS | Status: AC
  Filled 2020-11-09: qty 100

## 2020-11-09 MED ORDER — CEFAZOLIN SODIUM-DEXTROSE 2-4 GM/100ML-% IV SOLN
INTRAVENOUS | Status: AC
  Filled 2020-11-09: qty 100

## 2020-11-09 MED ORDER — BUPIVACAINE-EPINEPHRINE (PF) 0.25% -1:200000 IJ SOLN
INTRAMUSCULAR | Status: AC
  Filled 2020-11-09: qty 30

## 2020-11-09 MED ORDER — HYDROMORPHONE HCL 1 MG/ML IJ SOLN: 0.2500 mg | INTRAMUSCULAR | Status: DC | PRN

## 2020-11-09 MED ORDER — PHENYLEPHRINE HCL-NACL 10-0.9 MG/250ML-% IV SOLN
INTRAVENOUS | Status: DC | PRN
  Administered 2020-11-09: 50 ug/min via INTRAVENOUS

## 2020-11-09 MED ORDER — CELECOXIB 200 MG PO CAPS
200.0000 mg | ORAL_CAPSULE | Freq: Once | ORAL | Status: AC
  Administered 2020-11-09: 200 mg via ORAL
  Filled 2020-11-09: qty 1

## 2020-11-09 MED ORDER — PHENYLEPHRINE HCL-NACL 10-0.9 MG/250ML-% IV SOLN
INTRAVENOUS | Status: AC
  Filled 2020-11-09: qty 250

## 2020-11-09 MED ORDER — DEXAMETHASONE SODIUM PHOSPHATE 10 MG/ML IJ SOLN
INTRAMUSCULAR | Status: AC
  Filled 2020-11-09: qty 1

## 2020-11-09 MED ORDER — WATER FOR IRRIGATION, STERILE IR SOLN
Status: DC | PRN
  Administered 2020-11-09: 1000 mL

## 2020-11-09 MED ORDER — FENTANYL CITRATE (PF) 100 MCG/2ML IJ SOLN
INTRAMUSCULAR | Status: AC
  Filled 2020-11-09: qty 2

## 2020-11-09 MED ORDER — ACETAMINOPHEN 500 MG PO TABS: 1000.0000 mg | ORAL_TABLET | Freq: Three times a day (TID) | ORAL | 0 refills | Status: DC | PRN

## 2020-11-09 MED ORDER — TRANEXAMIC ACID-NACL 1000-0.7 MG/100ML-% IV SOLN
1000.0000 mg | INTRAVENOUS | Status: AC
  Administered 2020-11-09: 1000 mg via INTRAVENOUS
  Filled 2020-11-09: qty 100

## 2020-11-09 MED ORDER — ONDANSETRON HCL 4 MG/2ML IJ SOLN
INTRAMUSCULAR | Status: DC | PRN
  Administered 2020-11-09: 4 mg via INTRAVENOUS

## 2020-11-09 MED ORDER — MIDAZOLAM HCL 2 MG/2ML IJ SOLN
INTRAMUSCULAR | Status: AC
  Filled 2020-11-09: qty 2

## 2020-11-09 MED ORDER — EPHEDRINE 5 MG/ML INJ
INTRAVENOUS | Status: AC
  Filled 2020-11-09: qty 10

## 2020-11-09 MED ORDER — OXYCODONE HCL 5 MG PO TABS
5.0000 mg | ORAL_TABLET | ORAL | Status: DC | PRN
  Administered 2020-11-09 (×2): 5 mg via ORAL

## 2020-11-09 MED ORDER — PROPOFOL 10 MG/ML IV BOLUS
INTRAVENOUS | Status: AC
  Filled 2020-11-09: qty 20

## 2020-11-09 MED ORDER — POVIDONE-IODINE 10 % EX SWAB
2.0000 "application " | Freq: Once | CUTANEOUS | Status: AC
  Administered 2020-11-09: 2 via TOPICAL

## 2020-11-09 MED ORDER — TIZANIDINE HCL 4 MG PO TABS: 4.0000 mg | ORAL_TABLET | Freq: Four times a day (QID) | ORAL | 0 refills | Status: DC | PRN

## 2020-11-09 MED ORDER — 0.9 % SODIUM CHLORIDE (POUR BTL) OPTIME
TOPICAL | Status: DC | PRN
  Administered 2020-11-09: 1000 mL

## 2020-11-09 SURGICAL SUPPLY — 60 items
ATTUNE PS FEM LT SZ 4 CEM KNEE (Femur) ×2 IMPLANT
ATTUNE PSRP INSR SZ4 6 KNEE (Insert) ×2 IMPLANT
BAG DECANTER FOR FLEXI CONT (MISCELLANEOUS) ×2 IMPLANT
BAG ZIPLOCK 12X15 (MISCELLANEOUS) ×2 IMPLANT
BASE TIBIAL ROT PLAT SZ 5 KNEE (Knees) ×1 IMPLANT
BENZOIN TINCTURE PRP APPL 2/3 (GAUZE/BANDAGES/DRESSINGS) ×2 IMPLANT
BLADE SAGITTAL 25.0X1.19X90 (BLADE) ×2 IMPLANT
BLADE SAW SGTL 13X75X1.27 (BLADE) ×2 IMPLANT
BLADE SURG 15 STRL LF DISP TIS (BLADE) ×1 IMPLANT
BLADE SURG 15 STRL SS (BLADE) ×2
BLADE SURG SZ10 CARB STEEL (BLADE) ×2 IMPLANT
BNDG ELASTIC 6X15 VLCR STRL LF (GAUZE/BANDAGES/DRESSINGS) ×2 IMPLANT
BOWL SMART MIX CTS (DISPOSABLE) ×2 IMPLANT
CEMENT HV SMART SET (Cement) ×4 IMPLANT
CLSR STERI-STRIP ANTIMIC 1/2X4 (GAUZE/BANDAGES/DRESSINGS) ×2 IMPLANT
COVER SURGICAL LIGHT HANDLE (MISCELLANEOUS) ×2 IMPLANT
COVER WAND RF STERILE (DRAPES) ×2 IMPLANT
CUFF TOURN SGL QUICK 34 (TOURNIQUET CUFF) ×2
CUFF TRNQT CYL 34X4.125X (TOURNIQUET CUFF) ×1 IMPLANT
DECANTER SPIKE VIAL GLASS SM (MISCELLANEOUS) ×4 IMPLANT
DRAPE INCISE IOBAN 66X45 STRL (DRAPES) IMPLANT
DRAPE U-SHAPE 47X51 STRL (DRAPES) ×2 IMPLANT
DRESSING AQUACEL AG SP 3.5X10 (GAUZE/BANDAGES/DRESSINGS) ×1 IMPLANT
DRSG AQUACEL AG ADV 3.5X14 (GAUZE/BANDAGES/DRESSINGS) ×2 IMPLANT
DRSG AQUACEL AG SP 3.5X10 (GAUZE/BANDAGES/DRESSINGS) ×2
DURAPREP 26ML APPLICATOR (WOUND CARE) ×4 IMPLANT
ELECT REM PT RETURN 15FT ADLT (MISCELLANEOUS) ×2 IMPLANT
GLOVE SRG 8 PF TXTR STRL LF DI (GLOVE) ×2 IMPLANT
GLOVE SURG ORTHO LTX SZ8 (GLOVE) ×2 IMPLANT
GLOVE SURG SS PI 7.5 STRL IVOR (GLOVE) ×2 IMPLANT
GLOVE SURG UNDER POLY LF SZ8 (GLOVE) ×4
GOWN STRL REUS W/TWL XL LVL3 (GOWN DISPOSABLE) ×4 IMPLANT
HANDPIECE INTERPULSE COAX TIP (DISPOSABLE) ×2
HOLDER FOLEY CATH W/STRAP (MISCELLANEOUS) IMPLANT
HOOD PEEL AWAY FLYTE STAYCOOL (MISCELLANEOUS) ×2 IMPLANT
IMMOBILIZER KNEE 20 (SOFTGOODS) ×2
IMMOBILIZER KNEE 20 THIGH 36 (SOFTGOODS) ×1 IMPLANT
KIT TURNOVER KIT A (KITS) IMPLANT
MANIFOLD NEPTUNE II (INSTRUMENTS) ×2 IMPLANT
NEEDLE HYPO 22GX1.5 SAFETY (NEEDLE) ×4 IMPLANT
NS IRRIG 1000ML POUR BTL (IV SOLUTION) ×2 IMPLANT
PACK ICE MAXI GEL EZY WRAP (MISCELLANEOUS) ×2 IMPLANT
PACK TOTAL KNEE CUSTOM (KITS) ×2 IMPLANT
PATELLA MEDIAL ATTUN 35MM KNEE (Knees) ×2 IMPLANT
PENCIL SMOKE EVACUATOR (MISCELLANEOUS) IMPLANT
PIN DRILL FIX HALF THREAD (BIT) ×2 IMPLANT
PIN STEINMAN FIXATION KNEE (PIN) ×2 IMPLANT
PROTECTOR NERVE ULNAR (MISCELLANEOUS) ×2 IMPLANT
SET HNDPC FAN SPRY TIP SCT (DISPOSABLE) ×1 IMPLANT
SUT ETHIBOND NAB CT1 #1 30IN (SUTURE) ×4 IMPLANT
SUT MNCRL AB 3-0 PS2 18 (SUTURE) ×2 IMPLANT
SUT VIC AB 0 CT1 36 (SUTURE) ×2 IMPLANT
SUT VIC AB 2-0 CT1 27 (SUTURE) ×4
SUT VIC AB 2-0 CT1 TAPERPNT 27 (SUTURE) ×2 IMPLANT
SYR CONTROL 10ML LL (SYRINGE) ×6 IMPLANT
TIBIAL BASE ROT PLAT SZ 5 KNEE (Knees) ×2 IMPLANT
TOWEL OR 17X26 10 PK STRL BLUE (TOWEL DISPOSABLE) ×2 IMPLANT
TRAY FOLEY MTR SLVR 16FR STAT (SET/KITS/TRAYS/PACK) ×2 IMPLANT
WATER STERILE IRR 1000ML POUR (IV SOLUTION) ×4 IMPLANT
WRAP KNEE MAXI GEL POST OP (GAUZE/BANDAGES/DRESSINGS) ×2 IMPLANT

## 2020-11-09 NOTE — Interval H&P Note (Signed)
History and Physical Interval Note:  11/09/2020 7:39 AM  Veronica Price  has presented today for surgery, with the diagnosis of OA LEFT KNEE.  The various methods of treatment have been discussed with the patient and family. After consideration of risks, benefits and other options for treatment, the patient has consented to  Procedure(s): TOTAL KNEE ARTHROPLASTY (Left) as a surgical intervention.  The patient's history has been reviewed, patient examined, no change in status, stable for surgery.  I have reviewed the patient's chart and labs.  Questions were answered to the patient's satisfaction.     Yvette Rack

## 2020-11-09 NOTE — Discharge Instructions (Signed)

## 2020-11-09 NOTE — Brief Op Note (Signed)
11/09/2020  9:49 AM  PATIENT:  [name]  75 y.o. female  PRE-OPERATIVE DIAGNOSIS:  OA LEFT KNEE  POST-OPERATIVE DIAGNOSIS:  OA LEFT KNEE  PROCEDURE:  Procedure(s): TOTAL KNEE ARTHROPLASTY (Left)  SURGEON:  Surgeon(s) and Role:    Earlie Server, MD - Primary  PHYSICIAN ASSISTANT: Chriss Czar, PA-C  ASSISTANTS: OR staff x1   ANESTHESIA:   local, regional, spinal and IV sedation  EBL:  25 mL   BLOOD ADMINISTERED:none  DRAINS: none   LOCAL MEDICATIONS USED:  MARCAINE     SPECIMEN:  No Specimen  DISPOSITION OF SPECIMEN:  N/A  COUNTS:  YES  TOURNIQUET:   Total Tourniquet Time Documented: Thigh (Left) - 54 minutes Total: Thigh (Left) - 54 minutes   DICTATION: .Other Dictation: Dictation Number unknown  PLAN OF CARE: Discharge to home after PACU  PATIENT DISPOSITION:  PACU - hemodynamically stable.   Delay start of Pharmacological VTE agent (>24hrs) due to surgical blood loss or risk of bleeding: yes

## 2020-11-09 NOTE — Anesthesia Procedure Notes (Signed)
Anesthesia Regional Block: Adductor canal block   Pre-Anesthetic Checklist: ,, timeout performed, Correct Patient, Correct Site, Correct Laterality, Correct Procedure, Correct Position, site marked, Risks and benefits discussed, pre-op evaluation,  At surgeon's request and post-op pain management  Laterality: Left  Prep: Maximum Sterile Barrier Precautions used, chloraprep       Needles:  Injection technique: Single-shot  Needle Type: Echogenic Stimulator Needle     Needle Length: 9cm  Needle Gauge: 21     Additional Needles:   Procedures:,,,, ultrasound used (permanent image in chart),,,,  Narrative:  Start time: 11/09/2020 6:53 AM End time: 11/09/2020 7:03 AM Injection made incrementally with aspirations every 5 mL.  Performed by: Personally  Anesthesiologist: Roderic Palau, MD  Additional Notes: 2% Lidocaine skin wheel.

## 2020-11-09 NOTE — Op Note (Signed)
NAME: RAZIAH, FUNNELL MEDICAL RECORD EH:[id number] ACCOUNT [id number] DATE OF BIRTH:Dec 19, 1945 FACILITY: [facility] LOCATION: [facility] PHYSICIAN:W. Melizza Kanode JR., MD  OPERATIVE REPORT  DATE OF PROCEDURE:  11/09/2020  PREOPERATIVE DIAGNOSIS:  Severe osteoarthritis, left knee with varus deformity.  POSTOPERATIVE DIAGNOSIS:  Severe osteoarthritis, left knee with varus deformity.  OPERATION:  Left total knee (DePuy cemented knee size 4 femur, size 4, 6 mm tibial bearing, size 5 tibia with 35 mm all poly patella.  SURGEON:  Vangie Bicker, MD  ASSISTANTMarjo Bicker.  ANESTHESIA:  Spinal with adductor block.  DESCRIPTION OF PROCEDURE:  Straight skin incision, medial parapatellar approach to the knee made.  We did a 5-degree 10 mm cut on the femur, 5-degree valgus, 10 mm cut on the femur.  We cut provisionally about 6 mm below the least diseased lateral  compartment with a provisional cut for an additional 1 mm obtained full extension.  Moderate medial stripping of the knee deformity was done as well.  I sized the femur could be a size 4, placed all-in-1 cutting block with 3 degrees external rotation  cutting the anterior, posterior cuts as well as the chamfer cuts.  A keel hole was cut for the tibia.  Box cut on the femur.  Trial patella was placed after placing cutting 9.5 mm patella thickness cut, again trials were placed, we settled on the 6 mm  thickness bearing.  Resolution of the varus deformity was noted with excellent stability in all planes tested.  No tendency for bearing spinout and full extension.  Cement was prepared on the back table.  Mixture of Marcaine and Exparel infiltrated in  the subcutaneous and capsular tissues.  Final components were inserted tibia followed by femur and patella with the cement allowed to harden with the trial bearing.  Trial bearing was removed.  No excess cement was noted.  Tourniquet was released under  direct vision, no excessive bleeding was  noted.  Final bearing was placed matching the femur.  Closure was affected with #1 Ethibond, 2-0 Vicryl and Monocryl in the skin.  Taken to recovery room in stable condition.  HN/NUANCE  D:11/09/2020 T:11/09/2020 JOB:[id number]

## 2020-11-09 NOTE — Evaluation (Signed)
Physical Therapy Evaluation Patient Details Name: Veronica Price MRN: [id number] DOB: November 05, 1945 Today's Date: 11/09/2020   History of Present Illness  Pt s/p L TKR and with hx of breast CA and recent R TKR  Clinical Impression  Pt admitted as above and presenting with functional mobility limitations 2* decreased L LE strength/ROM and post op pain.  Pt plans dc home this date with spousal assist and HHPT follow up.  This date pt is mobilizing with min guard to sup assist.  Spouse present for session and to review stairs, therex, and don/doff KI.    Follow Up Recommendations Home health PT    Equipment Recommendations  None recommended by PT    Recommendations for Other Services       Precautions / Restrictions Precautions Precautions: Knee;Fall Required Braces or Orthoses: Knee Immobilizer - Left Knee Immobilizer - Left: Discontinue once straight leg raise with < 10 degree lag Restrictions Weight Bearing Restrictions: No Other Position/Activity Restrictions: WBAT      Mobility  Bed Mobility Overal bed mobility: Needs Assistance Bed Mobility: Supine to Sit     Supine to sit: Min guard     General bed mobility comments: min cues for sequence and min guard for L LE management    Transfers Overall transfer level: Needs assistance Equipment used: Rolling walker (2 wheeled) Transfers: Sit to/from Stand Sit to Stand: Min guard;Supervision         General transfer comment: cues for LE management and use of UEs to self assist  Ambulation/Gait Ambulation/Gait assistance: Min assist;Min guard;Supervision Gait Distance (Feet): 140 Feet Assistive device: Rolling walker (2 wheeled) Gait Pattern/deviations: Step-to pattern;Step-through pattern;Decreased step length - right;Decreased step length - left;Shuffle;Trunk flexed     General Gait Details: min assist for stability progressing to close supervision.  Cues for posture, position from RW and initial  sequence  Stairs Stairs: Yes Stairs assistance: Min assist Stair Management: No rails;Step to pattern;Forwards;With walker Number of Stairs: 2 General stair comments: single step twice with cues for sequence and foot/RW placement  Wheelchair Mobility    Modified Rankin (Stroke Patients Only)       Balance Overall balance assessment: Mild deficits observed, not formally tested                                           Pertinent Vitals/Pain Pain Assessment: 0-10 Pain Score: 6  Pain Location: L knee Pain Descriptors / Indicators: Aching;Sore Pain Intervention(s): Limited activity within patient's tolerance;Monitored during session;Premedicated before session;Patient requesting pain meds-RN notified    Home Living Family/patient expects to be discharged to:: Private residence Living Arrangements: Spouse/significant other Available Help at Discharge: Family Type of Home: House Home Access: Stairs to enter     Home Layout: One level;Laundry or work area in [place]: Environmental consultant - 2 wheels;Bedside commode;Cane - single point      Prior Function Level of Independence: Independent               Hand Dominance   Dominant Hand: Right    Extremity/Trunk Assessment   Upper Extremity Assessment Upper Extremity Assessment: Overall WFL for tasks assessed    Lower Extremity Assessment Lower Extremity Assessment: LLE deficits/detail LLE Deficits / Details: AAROM -5 - 70 with quad strength 2+/5    Cervical / Trunk Assessment Cervical / Trunk Assessment: Normal  Communication   Communication: No difficulties  Cognition Arousal/Alertness: Awake/alert Behavior During Therapy: WFL for tasks assessed/performed Overall Cognitive Status: Within Functional Limits for tasks assessed                                        General Comments      Exercises Total Joint Exercises Ankle Circles/Pumps: AROM;Both;15  reps;Supine Quad Sets: AROM;Both;10 reps;Supine Heel Slides: AAROM;Left;10 reps;Supine Straight Leg Raises: AAROM;Left;10 reps;Supine   Assessment/Plan    PT Assessment Patient needs continued PT services  PT Problem List Decreased strength;Decreased range of motion;Decreased activity tolerance;Decreased balance;Decreased mobility;Decreased safety awareness;Decreased knowledge of use of DME;Pain       PT Treatment Interventions DME instruction;Gait training;Stair training;Functional mobility training;Therapeutic activities;Therapeutic exercise;Patient/family education    PT Goals (Current goals can be found in the Care Plan section)  Acute Rehab PT Goals Patient Stated Goal: Get HOme PT Goal Formulation: All assessment and education complete, DC therapy    Frequency Min 1X/week   Barriers to discharge        Co-evaluation               AM-PAC PT "6 Clicks" Mobility  Outcome Measure Help needed turning from your back to your side while in a flat bed without using bedrails?: A Little Help needed moving from lying on your back to sitting on the side of a flat bed without using bedrails?: A Little Help needed moving to and from a bed to a chair (including a wheelchair)?: A Little Help needed standing up from a chair using your arms (e.g., wheelchair or bedside chair)?: A Little Help needed to walk in hospital room?: A Little Help needed climbing 3-5 steps with a railing? : A Little 6 Click Score: 18    End of Session Equipment Utilized During Treatment: Gait belt;Left knee immobilizer Activity Tolerance: Patient tolerated treatment well Patient left: in chair;with call bell/phone within reach;with family/visitor present Nurse Communication: Mobility status PT Visit Diagnosis: Difficulty in walking, not elsewhere classified (R26.2);Pain Pain - Right/Left: Left Pain - part of body: Knee    Time: 1333-1411 PT Time Calculation (min) (ACUTE ONLY): 38 min   Charges:    PT Evaluation $PT Eval Low Complexity: 1 Low PT Treatments $Gait Training: 8-22 mins $Therapeutic Exercise: 8-22 mins        Debe Coder PT Acute Rehabilitation Services Pager [phone] Office [phone]   Ernisha Sorn 11/09/2020, 3:04 PM

## 2020-11-09 NOTE — Anesthesia Procedure Notes (Signed)
Spinal  Patient location during procedure: OR Start time: 11/09/2020 7:48 AM End time: 11/09/2020 7:53 AM Staffing Performed: anesthesiologist  Anesthesiologist: Roderic Palau, MD Preanesthetic Checklist Completed: patient identified, IV checked, risks and benefits discussed, surgical consent, monitors and equipment checked, pre-op evaluation and timeout performed Spinal Block Patient position: sitting Prep: DuraPrep Patient monitoring: cardiac monitor, continuous pulse ox and blood pressure Approach: midline Location: L3-4 Injection technique: single-shot Needle Needle type: Pencan  Needle gauge: 24 G Needle length: 9 cm Assessment Sensory level: T8 Additional Notes Functioning IV was confirmed and monitors were applied. Sterile prep and drape, including hand hygiene and sterile gloves were used. The patient was positioned and the spine was prepped. The skin was anesthetized with lidocaine.  Free flow of clear CSF was obtained prior to injecting local anesthetic into the CSF.  The spinal needle aspirated freely following injection.  The needle was carefully withdrawn.  The patient tolerated the procedure well.

## 2020-11-09 NOTE — Anesthesia Postprocedure Evaluation (Signed)
Anesthesia Post Note  Patient: Veronica Price  Procedure(s) Performed: TOTAL KNEE ARTHROPLASTY (Left Knee)     Patient location during evaluation: PACU Anesthesia Type: Spinal and Regional Level of consciousness: oriented and awake and alert Pain management: pain level controlled Vital Signs Assessment: post-procedure vital signs reviewed and stable Respiratory status: spontaneous breathing and respiratory function stable Cardiovascular status: blood pressure returned to baseline and stable Postop Assessment: no headache, no backache, no apparent nausea or vomiting, spinal receding and patient able to bend at knees Anesthetic complications: no   No complications documented.  Last Vitals:  Vitals:   11/09/20 1102 11/09/20 1254  BP: 138/81 (!) 142/76  Pulse: 64 62  Resp: 16 16  Temp: 36.4 C 36.4 C  SpO2: 100%     Last Pain:  Vitals:   11/09/20 1102  TempSrc:   PainSc: 0-No pain    LLE Motor Response: Purposeful movement (11/09/20 1254)   RLE Motor Response: Purposeful movement (11/09/20 1254)   L Sensory Level: S1-Sole of foot, small toes (11/09/20 1254) R Sensory Level: S1-Sole of foot, small toes (11/09/20 1254)  Laticia Vannostrand,W. EDMOND

## 2020-11-09 NOTE — Transfer of Care (Signed)
Immediate Anesthesia Transfer of Care Note  Patient: Veronica Price  Procedure(s) Performed: TOTAL KNEE ARTHROPLASTY (Left Knee)  Patient Location: PACU  Anesthesia Type:Spinal  Level of Consciousness: awake, alert  and oriented  Airway & Oxygen Therapy: Patient Spontanous Breathing and Patient connected to face mask  Post-op Assessment: Report given to RN and Post -op Vital signs reviewed and stable  Post vital signs: Reviewed and stable  Last Vitals:  Vitals Value Taken Time  BP 122/63 11/09/20 0949  Temp    Pulse 67 11/09/20 0954  Resp 11 11/09/20 0954  SpO2 100 % 11/09/20 0954  Vitals shown include unvalidated device data.  Last Pain:  Vitals:   11/09/20 0550  TempSrc: Oral  PainSc: 0-No pain         Complications: No complications documented.

## 2020-11-12 ENCOUNTER — Encounter (HOSPITAL_COMMUNITY): Payer: Self-pay | Admitting: Orthopedic Surgery

## 2020-11-13 DIAGNOSIS — Z853 Personal history of malignant neoplasm of breast: Secondary | ICD-10-CM | POA: Diagnosis not present

## 2020-11-13 DIAGNOSIS — E039 Hypothyroidism, unspecified: Secondary | ICD-10-CM | POA: Diagnosis not present

## 2020-11-13 DIAGNOSIS — Z471 Aftercare following joint replacement surgery: Secondary | ICD-10-CM | POA: Diagnosis not present

## 2020-11-13 DIAGNOSIS — Z96652 Presence of left artificial knee joint: Secondary | ICD-10-CM | POA: Diagnosis not present

## 2020-11-13 DIAGNOSIS — M1712 Unilateral primary osteoarthritis, left knee: Secondary | ICD-10-CM | POA: Diagnosis not present

## 2020-11-15 DIAGNOSIS — Z853 Personal history of malignant neoplasm of breast: Secondary | ICD-10-CM | POA: Diagnosis not present

## 2020-11-15 DIAGNOSIS — M1712 Unilateral primary osteoarthritis, left knee: Secondary | ICD-10-CM | POA: Diagnosis not present

## 2020-11-15 DIAGNOSIS — Z471 Aftercare following joint replacement surgery: Secondary | ICD-10-CM | POA: Diagnosis not present

## 2020-11-15 DIAGNOSIS — E039 Hypothyroidism, unspecified: Secondary | ICD-10-CM | POA: Diagnosis not present

## 2020-11-15 DIAGNOSIS — Z96652 Presence of left artificial knee joint: Secondary | ICD-10-CM | POA: Diagnosis not present

## 2020-11-16 DIAGNOSIS — Z853 Personal history of malignant neoplasm of breast: Secondary | ICD-10-CM | POA: Diagnosis not present

## 2020-11-16 DIAGNOSIS — Z471 Aftercare following joint replacement surgery: Secondary | ICD-10-CM | POA: Diagnosis not present

## 2020-11-16 DIAGNOSIS — Z96652 Presence of left artificial knee joint: Secondary | ICD-10-CM | POA: Diagnosis not present

## 2020-11-16 DIAGNOSIS — E039 Hypothyroidism, unspecified: Secondary | ICD-10-CM | POA: Diagnosis not present

## 2020-11-16 DIAGNOSIS — M1712 Unilateral primary osteoarthritis, left knee: Secondary | ICD-10-CM | POA: Diagnosis not present

## 2020-11-19 DIAGNOSIS — E039 Hypothyroidism, unspecified: Secondary | ICD-10-CM | POA: Diagnosis not present

## 2020-11-19 DIAGNOSIS — Z853 Personal history of malignant neoplasm of breast: Secondary | ICD-10-CM | POA: Diagnosis not present

## 2020-11-19 DIAGNOSIS — Z96652 Presence of left artificial knee joint: Secondary | ICD-10-CM | POA: Diagnosis not present

## 2020-11-19 DIAGNOSIS — M1712 Unilateral primary osteoarthritis, left knee: Secondary | ICD-10-CM | POA: Diagnosis not present

## 2020-11-19 DIAGNOSIS — Z471 Aftercare following joint replacement surgery: Secondary | ICD-10-CM | POA: Diagnosis not present

## 2020-11-21 DIAGNOSIS — Z471 Aftercare following joint replacement surgery: Secondary | ICD-10-CM | POA: Diagnosis not present

## 2020-11-21 DIAGNOSIS — E039 Hypothyroidism, unspecified: Secondary | ICD-10-CM | POA: Diagnosis not present

## 2020-11-21 DIAGNOSIS — Z853 Personal history of malignant neoplasm of breast: Secondary | ICD-10-CM | POA: Diagnosis not present

## 2020-11-21 DIAGNOSIS — M1712 Unilateral primary osteoarthritis, left knee: Secondary | ICD-10-CM | POA: Diagnosis not present

## 2020-11-21 DIAGNOSIS — Z96652 Presence of left artificial knee joint: Secondary | ICD-10-CM | POA: Diagnosis not present

## 2020-11-22 DIAGNOSIS — Z96652 Presence of left artificial knee joint: Secondary | ICD-10-CM | POA: Diagnosis not present

## 2020-11-22 DIAGNOSIS — M25562 Pain in left knee: Secondary | ICD-10-CM | POA: Diagnosis not present

## 2020-11-23 DIAGNOSIS — M1712 Unilateral primary osteoarthritis, left knee: Secondary | ICD-10-CM | POA: Diagnosis not present

## 2020-11-26 DIAGNOSIS — M25562 Pain in left knee: Secondary | ICD-10-CM | POA: Diagnosis not present

## 2020-11-26 DIAGNOSIS — Z96652 Presence of left artificial knee joint: Secondary | ICD-10-CM | POA: Diagnosis not present

## 2020-11-28 DIAGNOSIS — M25562 Pain in left knee: Secondary | ICD-10-CM | POA: Diagnosis not present

## 2020-11-28 DIAGNOSIS — Z96652 Presence of left artificial knee joint: Secondary | ICD-10-CM | POA: Diagnosis not present

## 2020-12-03 DIAGNOSIS — Z96652 Presence of left artificial knee joint: Secondary | ICD-10-CM | POA: Diagnosis not present

## 2020-12-03 DIAGNOSIS — M25562 Pain in left knee: Secondary | ICD-10-CM | POA: Diagnosis not present

## 2020-12-04 DIAGNOSIS — M1712 Unilateral primary osteoarthritis, left knee: Secondary | ICD-10-CM | POA: Diagnosis not present

## 2020-12-05 DIAGNOSIS — M25562 Pain in left knee: Secondary | ICD-10-CM | POA: Diagnosis not present

## 2020-12-05 DIAGNOSIS — Z96652 Presence of left artificial knee joint: Secondary | ICD-10-CM | POA: Diagnosis not present

## 2020-12-12 DIAGNOSIS — Z96652 Presence of left artificial knee joint: Secondary | ICD-10-CM | POA: Diagnosis not present

## 2020-12-12 DIAGNOSIS — M25562 Pain in left knee: Secondary | ICD-10-CM | POA: Diagnosis not present

## 2020-12-13 DIAGNOSIS — Z96652 Presence of left artificial knee joint: Secondary | ICD-10-CM | POA: Diagnosis not present

## 2020-12-13 DIAGNOSIS — M25562 Pain in left knee: Secondary | ICD-10-CM | POA: Diagnosis not present

## 2021-02-11 ENCOUNTER — Other Ambulatory Visit: Payer: Self-pay | Admitting: Oncology

## 2021-02-11 DIAGNOSIS — C50411 Malignant neoplasm of upper-outer quadrant of right female breast: Secondary | ICD-10-CM

## 2021-02-11 DIAGNOSIS — Z171 Estrogen receptor negative status [ER-]: Secondary | ICD-10-CM

## 2021-08-15 ENCOUNTER — Other Ambulatory Visit: Payer: Self-pay | Admitting: Oncology

## 2021-08-15 DIAGNOSIS — Z171 Estrogen receptor negative status [ER-]: Secondary | ICD-10-CM

## 2021-08-19 NOTE — Progress Notes (Signed)
[facility]  [phone] [phone]) [phone]    ID: Veronica Price DOB: 12-21-1945  MR#: [id number]  TDH#:[id number]  Patient Care Team: Renee Pain, FNP as PCP - General (Nurse Practitioner) Leslieanne Cobarrubias, [name], MD as Consulting Physician (Oncology) [name], MD as Consulting Physician (General Surgery) [name], MD as Radiation Oncologist (Radiation Oncology) Earlie Server, MD as Consulting Physician (Orthopedic Surgery) OTHER MD:   CHIEF COMPLAINT: Triple negative breast cancer  CURRENT TREATMENT: Observation   INTERVAL HISTORY: Veronica Price returns today for follow-up of her triple negative breast cancer. She is on observation.    She has not had a mammogram in the last 2 years.  At the last visit I did write her a prescription for mammography but she never went.  She says it is very painful when she goes.   REVIEW OF SYSTEMS: Veronica Price tells me she has been having frequent headaches.  She does not take anything for this.  They just go away on their own.  They have not been accompanied by balance problems falls nausea or vomiting.  She does have sinus symptoms.  She has urinary frequency and she says she has seen blood in her urine.  There is no dysuria no fever.  She has had knee surgery x2 under Dr. French Ana since her last visit here and she feels that went well for her.  She walks maybe a mile at a time with her husband and also is on her feet all day she says.  A detailed review of systems today was otherwise noncontributory   COVID 19 VACCINATION STATUS: s/p Moderna x 08 June 2021, most recent dose FEB 2021   HISTORY OF CURRENT ILLNESS: Veronica Price initially palpated a mass to her right breast in February 2019.   She brought her to her primary care physician's attention and was set up for bilateral diagnostic mammography with tomography and right breast ultrasonography on 11/25/2017 showing: a 3.2 x 3.7 cm lobulated  mass in the UOQ of the right breast; there was a 3 cm calcified retroareolar mass in the left breast. On right breast ultrasonography there was a complex mostly solid 2.4 x 3.1 cm mass at 10 o'clock and multiple enlarged right axillary lymph nodes, the largest measuring 1.6 x 2.4.  Accordingly on 11/27/2017 the patient proceeded to biopsy of the right breast area in question, as well as an attempted axillary node biopsy. The pathology from this procedure showed ([id number]):  high grade infiltrating ductal carcinoma with no lymphovascular invasion.  The attempted axillary node biopsy showed fat, no nodal tissue.  Prognostic indicators were: estrogen and progesterone receptor negative,. Proliferation marker Ki67 at >80%. HER2 negative by immunohistochemistry at 0%  CT scans of the chest abdomen and pelvis completed on 12/10/2017 showed A complex solid and cystic 3.5 cm right breast mass with several bilateral axillary lymph nodes, the largest in the right axilla measured 12 mm. There was no evidence of visceral metastatic disease.   She had a bone scan on 12/10/2017 that showed two areas of increased uptake overlying the lateral aspects of the T11 vertebrae. Review of the CT scan of the chest revealed sclerosis of the T11 pedicles concerning for bone metastases.  However subsequent spinal MRI (I do not have that report) read this out as arthritis, not a metastatic deposit  The patient had a echocardiogram completed on 3/72019 with results showing: left ventricular ejection fraction of 55-60% with mild concentric hypertrophy. There was mild to  moderate mitral and mild tricuspid regurgitation with mild pulmonary hypertension. The aortic valve was mildly thickened with trace aortic regurgitation.   The patient's subsequent history is as detailed below.   PAST MEDICAL HISTORY: Past Medical History:  Diagnosis Date   Arthritis 2003   Breast cancer ([place]) 11/25/2017   Right breast   Cataract 2016   "early"  per patient-no surgery yet    GERD (gastroesophageal reflux disease) 2003   per patient report   Hypothyroidism 10/20/2018    PAST SURGICAL HISTORY: Past Surgical History:  Procedure Laterality Date   ABDOMINAL HYSTERECTOMY  2002   Total hysterectomy in 2002   BREAST LUMPECTOMY WITH RADIOACTIVE SEED AND SENTINEL LYMPH NODE BIOPSY Right 06/28/2018   Procedure: BREAST LUMPECTOMY WITH RADIOACTIVE SEED AND SENTINEL LYMPH NODE BIOPSY;  Surgeon: [name], MD;  Location: [facility];  Service: General;  Laterality: Right;   PORTACATH PLACEMENT N/A 12/29/2017   Procedure: INSERTION PORT-A-CATH LEFT SUBCLAVIAN;  Surgeon: [name], MD;  Location: [facility];  Service: General;  Laterality: N/A;   TOTAL KNEE ARTHROPLASTY Right 08/03/2020   Procedure: TOTAL KNEE ARTHROPLASTY;  Surgeon: Earlie Server, MD;  Location: [facility];  Service: Orthopedics;  Laterality: Right;   TOTAL KNEE ARTHROPLASTY Left 11/09/2020   Procedure: TOTAL KNEE ARTHROPLASTY;  Surgeon: Earlie Server, MD;  Location: [facility];  Service: Orthopedics;  Laterality: Left;    FAMILY HISTORY Family History  Problem Relation Age of Onset   Diabetes Father    Heart attack Father   She notes that her father died from a possible MI in his early 80's Her mother is still alive and will be 101 March 2019!  The patient has 2 brothers and 2 sisters. Her brother had prostate cancer possibly agent orange related. She has a cousin who was recently diagnosed with stomach cancer. Her maternal grandmother died from colon cancer. She has a maternal uncle with bone cancer. She denies family hx of breast or ovarian cancer.    GYNECOLOGIC HISTORY:  Menarche: 75 years old Age at first live birth: 75 years old Veronica Price P3 LMP: at age 89 Contraceptive: OCP HRT   Hysterectomy? 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 She also worked at  [employer]. She lives at home with her husband, Gwyndolyn Saxon who is a retired Building control surveyor, and works part time driving an adult day care bus. Her daughter, [name] is a Multimedia programmer. Her daughter [name] lives in [place], [place] and works as a Therapist, sports for the [place]. Her son Alli Jasmer, lives in [place], and works as a Administrator.  The patient has 3 grandchildren. She is of baptist faith.     ADVANCED DIRECTIVES: In the absence of any documentation to the contrary, the patient's spouse is their HCPOA.    HEALTH MAINTENANCE: Social History   Tobacco Use   Smoking status: Never   Smokeless tobacco: Never  Vaping Use   Vaping Use: Never used  Substance Use Topics   Alcohol use: Never   Drug use: Never     Colonoscopy: UTD; last year (2018)  PAP:  Bone density:    Allergies  Allergen Reactions   Prednisone Swelling and Other (See Comments)    " I think it was this that made my tongue swell "--Patient has received steroid injections without any issues--[facility] 04/12/2019    Current Outpatient Medications  Medication Sig  Dispense Refill   acetaminophen (TYLENOL) 500 MG tablet Take 2 tablets (1,000 mg total) by mouth every 8 (eight) hours as needed for moderate pain. 30 tablet 0   celecoxib (CELEBREX) 200 MG capsule Take 1 capsule (200 mg total) by mouth 2 (two) times daily. 60 capsule 0   levothyroxine (SYNTHROID) 50 MCG tablet TAKE 1 TABLET BY MOUTH EVERY DAY BEFORE BREAKFAST 90 tablet 1   oxyCODONE (OXY IR/ROXICODONE) 5 MG immediate release tablet Take one tab po q4-6hrs prn pain, may need 1-2 first couple weeks 40 tablet 0   tiZANidine (ZANAFLEX) 4 MG tablet Take 1 tablet (4 mg total) by mouth every 6 (six) hours as needed for muscle spasms. 40 tablet 0   No current facility-administered medications for this visit.    OBJECTIVE:  African-American woman who appears stated age 51:   08/20/21 1021  BP: (!) 178/94  Pulse: 71  Resp: 17  Temp: 97.7 F (36.5 C)  SpO2: 97%      Body  mass index is 31.3 kg/m.   Wt Readings from Last 3 Encounters:  08/20/21 176 lb 11.2 oz (80.2 kg)  11/09/20 163 lb (73.9 kg)  11/06/20 163 lb (73.9 kg)  ECOG FS:1 - Symptomatic but completely ambulatory  Sclerae unicteric, EOMs intact Wearing a mask No cervical or supraclavicular adenopathy Lungs no rales or rhonchi Heart regular rate and rhythm Abd soft, nontender, positive bowel sounds MSK mild kyphosis but no focal spinal tenderness, no right upper extremity lymphedema Neuro: nonfocal, well oriented, appropriate affect Breasts: Right breast is status postlumpectomy and radiation.  There is significant distortion of the breast contour.  There is palpable scar tissue.  There is no evidence of disease recurrence however.  The left breast and both axillae are benign.   LAB RESULTS:  CMP     Component Value Date/Time   NA 138 11/06/2020 0947   K 4.6 11/06/2020 0947   CL 102 11/06/2020 0947   CO2 27 11/06/2020 0947   GLUCOSE 98 11/06/2020 0947   BUN 13 11/06/2020 0947   CREATININE 0.90 11/06/2020 0947   CREATININE 0.87 08/16/2020 1025   CALCIUM 9.8 11/06/2020 0947   PROT 8.1 11/06/2020 0947   ALBUMIN 4.2 11/06/2020 0947   AST 16 11/06/2020 0947   AST 15 08/16/2020 1025   ALT 14 11/06/2020 0947   ALT 18 08/16/2020 1025   ALKPHOS 70 11/06/2020 0947   BILITOT 0.7 11/06/2020 0947   BILITOT 0.6 08/16/2020 1025   GFRNONAA >60 11/06/2020 0947   GFRNONAA >60 08/16/2020 1025   GFRAA >60 02/14/2020 1010   GFRAA >60 02/08/2019 0805    Lab Results  Component Value Date   WBC 4.0 08/20/2021   NEUTROABS 2.6 08/20/2021   HGB 13.3 08/20/2021   HCT 40.6 08/20/2021   MCV 97.8 08/20/2021   PLT 200 08/20/2021   No results found for: LABCA2  No components found for: TOIZTI458  No results for input(s): INR in the last 168 hours.  No results found for: LABCA2  No results found for: KDX833  No results found for: ASN053  No results found for: ZJQ734  No results found for:  CA2729  No components found for: HGQUANT  No results found for: CEA1 / No results found for: CEA1   No results found for: AFPTUMOR  No results found for: CHROMOGRNA  No results found for: TOTALPROTELP, ALBUMINELP, A1GS, A2GS, BETS, BETA2SER, GAMS, MSPIKE, SPEI (this displays SPEP labs)  No results found for: KPAFRELGTCHN, LAMBDASER, KAPLAMBRATIO (  kappa/lambda light chains)  No results found for: HGBA, HGBA2QUANT, HGBFQUANT, HGBSQUAN (Hemoglobinopathy evaluation)   No results found for: LDH  No results found for: IRON, TIBC, IRONPCTSAT (Iron and TIBC)  No results found for: FERRITIN  Urinalysis    Component Value Date/Time   COLORURINE YELLOW 11/06/2020 [place] 11/06/2020 0947   LABSPEC 1.005 11/06/2020 0947   PHURINE 6.0 11/06/2020 0947   GLUCOSEU NEGATIVE 11/06/2020 0947   HGBUR MODERATE (A) 11/06/2020 [place] 11/06/2020 0947   KETONESUR NEGATIVE 11/06/2020 0947   PROTEINUR NEGATIVE 11/06/2020 0947   NITRITE POSITIVE (A) 11/06/2020 0947   LEUKOCYTESUR SMALL (A) 11/06/2020 0947    STUDIES: No results found.   ELIGIBLE FOR AVAILABLE RESEARCH PROTOCOL:  SWOG [id number] (observation arm)  ASSESSMENT: 75 y.o. Veronica Price, [place] woman status post right breast upper outer quadrant biopsy 11/25/2017 for a clinical T2 N2, stage IIIc invasive ductal carcinoma, grade 3, triple negative, with an MIB-1 of 80%.  (1) staging studies:  (a) chest CT 12/10/2017 scan showed no visceral metastatic disease  (b) bone scan 12/10/2017 showed uptake at T11, with sclerosis.  (c) CA-27-29 on 12/05/2017 was 19.0  (d) spinal MRI in [place] reportedly showed only arthrtis at T11  (2) neoadjuvant chemotherapy consisting of carboplatin/ paclitaxel x 12 (received 9) started 01/04/2018, discontinued 03/15/2018, followed by cyclophosphamide and doxorubicin given every 3 weeks x 4 starting 04/05/2018, completed 06/14/2018  (a) carboplatin/paclitaxel changed to  carboplatin/gemcitabine after 8 cycles due to neuroapthy  (b) only able to tolerate 1 cycle of Gemcitabine/Carboplatin due to rash  (a) echocardiogram on 04/02/18 demonstrates a LVEF of 60-65%  (3) status post right lumpectomy and sentinel lymph node sampling on 06/28/2018 showing a residual 2.2 cm, grade 3 invasive ductal carcinoma, with negative margins and 0 of 3 sentinel lymph nodes removed involved (ypT2 ypN0)  (4) adjuvant radiation through the [facility] group in [place] mid November through mid December 2019  (5) participating in the pembrolizumab study S1418, randomized to observation  (6) pembrolizumab started (not as part of study) 11/15/2018, repeated every 21 days, complicated by rash, last dose 05/24/2019  (7) additional studies  (a) chest CT scan 08/09/2019 showed no evidence of metastases.  A left renal cyst was noted  (b) abdominal MRI 08/23/2019 showed a benign Bosniak 2 renal cyst and additional Bosniak 1 renal cysts.  There was no evidence of metastatic disease.   PLAN:  Veronica Price is a little over 3 years out from definitive surgery for her breast cancer with no evidence of disease recurrence.  This is favorable.  She is behind on mammography and I have urged her to go ahead and get her mammogram done this month.  I wrote her a prescription so she could get a diagnostic but I told her if they tell her her insurance will only play for screening then go ahead and get the screening.  She does not like to get mammography because it is uncomfortable but she really does need to do it.  They will be faxing [place] the report as soon as she gets that done.  She has no dysuria no fever but some urinary frequency and says she has seen a little blood in her urine.  Going to get a culture and a urinalysis today.  If there is hematuria she will be referred to urology.  Otherwise I have encouraged her to walk daily and since her husband likes to walk about a mile a day she should accompany him.  I think if  she took Claritin most days her headaches would go away and I suggested that.  Otherwise she will return to see [place] in 1 year.  She knows to call for any other issue that may develop before that visit.  Total encounter time 25 minutes.Veronica Jews C. Nini Cavan, MD 08/20/21 10:33 AM Medical Oncology and Hematology [facility] [place], [place] [address] Tel. [phone]    Fax. [phone]   I, Wilburn Mylar, am acting as scribe for Dr. [name]. Veronica Price.  I, Lurline Del MD, have reviewed the above documentation for accuracy and completeness, and I agree with the above.   *Total Encounter Time as defined by the Centers for Medicare and Medicaid Services includes, in addition to the face-to-face time of a patient visit (documented in the note above) non-face-to-face time: obtaining and reviewing outside history, ordering and reviewing medications, tests or procedures, care coordination (communications with other health care professionals or caregivers) and documentation in the medical record.

## 2021-08-20 ENCOUNTER — Inpatient Hospital Stay: Payer: Medicare Other

## 2021-08-20 ENCOUNTER — Other Ambulatory Visit: Payer: Self-pay

## 2021-08-20 ENCOUNTER — Inpatient Hospital Stay: Payer: Medicare Other | Attending: Oncology | Admitting: Oncology

## 2021-08-20 VITALS — BP 178/94 | HR 71 | Temp 97.7°F | Resp 17 | Wt 176.7 lb

## 2021-08-20 DIAGNOSIS — R350 Frequency of micturition: Secondary | ICD-10-CM | POA: Insufficient documentation

## 2021-08-20 DIAGNOSIS — Z79899 Other long term (current) drug therapy: Secondary | ICD-10-CM | POA: Insufficient documentation

## 2021-08-20 DIAGNOSIS — Z171 Estrogen receptor negative status [ER-]: Secondary | ICD-10-CM

## 2021-08-20 DIAGNOSIS — C50411 Malignant neoplasm of upper-outer quadrant of right female breast: Secondary | ICD-10-CM | POA: Insufficient documentation

## 2021-08-20 DIAGNOSIS — Z9221 Personal history of antineoplastic chemotherapy: Secondary | ICD-10-CM | POA: Diagnosis not present

## 2021-08-20 DIAGNOSIS — R823 Hemoglobinuria: Secondary | ICD-10-CM | POA: Diagnosis not present

## 2021-08-20 DIAGNOSIS — Z923 Personal history of irradiation: Secondary | ICD-10-CM | POA: Diagnosis not present

## 2021-08-20 LAB — CBC WITH DIFFERENTIAL (CANCER CENTER ONLY)
Abs Immature Granulocytes: 0.01 10*3/uL (ref 0.00–0.07)
Basophils Absolute: 0 10*3/uL (ref 0.0–0.1)
Basophils Relative: 1 %
Eosinophils Absolute: 0.1 10*3/uL (ref 0.0–0.5)
Eosinophils Relative: 2 %
HCT: 40.6 % (ref 36.0–46.0)
Hemoglobin: 13.3 g/dL (ref 12.0–15.0)
Immature Granulocytes: 0 %
Lymphocytes Relative: 20 %
Lymphs Abs: 0.8 10*3/uL (ref 0.7–4.0)
MCH: 32 pg (ref 26.0–34.0)
MCHC: 32.8 g/dL (ref 30.0–36.0)
MCV: 97.8 fL (ref 80.0–100.0)
Monocytes Absolute: 0.5 10*3/uL (ref 0.1–1.0)
Monocytes Relative: 13 %
Neutro Abs: 2.6 10*3/uL (ref 1.7–7.7)
Neutrophils Relative %: 64 %
Platelet Count: 200 10*3/uL (ref 150–400)
RBC: 4.15 MIL/uL (ref 3.87–5.11)
RDW: 13.9 % (ref 11.5–15.5)
WBC Count: 4 10*3/uL (ref 4.0–10.5)
nRBC: 0 % (ref 0.0–0.2)

## 2021-08-20 LAB — CMP (CANCER CENTER ONLY)
ALT: 14 U/L (ref 0–44)
AST: 21 U/L (ref 15–41)
Albumin: 4 g/dL (ref 3.5–5.0)
Alkaline Phosphatase: 66 U/L (ref 38–126)
Anion gap: 8 (ref 5–15)
BUN: 17 mg/dL (ref 8–23)
CO2: 26 mmol/L (ref 22–32)
Calcium: 9.1 mg/dL (ref 8.9–10.3)
Chloride: 106 mmol/L (ref 98–111)
Creatinine: 0.86 mg/dL (ref 0.44–1.00)
GFR, Estimated: >60 mL/min (ref >60)
Glucose, Bld: 92 mg/dL (ref 70–99)
Potassium: 4.1 mmol/L (ref 3.5–5.1)
Sodium: 140 mmol/L (ref 135–145)
Total Bilirubin: 0.5 mg/dL (ref 0.3–1.2)
Total Protein: 7.8 g/dL (ref 6.5–8.1)

## 2021-08-20 LAB — URINALYSIS, COMPLETE (UACMP) WITH MICROSCOPIC
Bilirubin Urine: NEGATIVE
Glucose, UA: NEGATIVE mg/dL
Ketones, ur: NEGATIVE mg/dL
Nitrite: POSITIVE — AB
Protein, ur: NEGATIVE mg/dL
Specific Gravity, Urine: 1.01 (ref 1.005–1.030)
pH: 6 (ref 5.0–8.0)

## 2021-08-20 LAB — TSH: TSH: 3.218 u[IU]/mL (ref 0.308–3.960)

## 2021-08-23 ENCOUNTER — Telehealth: Payer: Self-pay | Admitting: *Deleted

## 2021-08-23 LAB — URINE CULTURE: Culture: >=100000 — AB

## 2021-08-23 MED ORDER — CIPROFLOXACIN HCL 500 MG PO TABS: 500.0000 mg | ORAL_TABLET | Freq: Two times a day (BID) | ORAL | 0 refills | Status: DC

## 2021-08-23 NOTE — Telephone Encounter (Signed)
Pt has not started the cipro per noted abnormal urine- she is still have symptoms.  This RN sent prescription per MD to verified pharmacy.

## 2022-03-17 ENCOUNTER — Encounter: Payer: Self-pay | Admitting: Oncology

## 2022-03-17 ENCOUNTER — Telehealth: Payer: Self-pay | Admitting: *Deleted

## 2022-03-17 NOTE — Telephone Encounter (Signed)
Received call from pt with complaint of right breast pain and vaginal spotting x several months.  Pt denies redness, drainage or swelling to right breast.  Pt denies recent injury or trauma and requesting to be seen by provider.  [facility] visit scheduled, pt verbalized understanding of appt date and time.

## 2022-03-18 ENCOUNTER — Inpatient Hospital Stay: Payer: Medicare PPO | Attending: Adult Health | Admitting: Adult Health

## 2022-03-18 ENCOUNTER — Encounter: Payer: Self-pay | Admitting: Adult Health

## 2022-03-18 ENCOUNTER — Other Ambulatory Visit: Payer: Self-pay

## 2022-03-18 ENCOUNTER — Inpatient Hospital Stay: Payer: Medicare PPO

## 2022-03-18 ENCOUNTER — Encounter: Payer: Self-pay | Admitting: Oncology

## 2022-03-18 VITALS — BP 128/64 | HR 90 | Temp 97.2°F | Resp 18 | Ht 63.0 in | Wt 174.7 lb

## 2022-03-18 DIAGNOSIS — C50411 Malignant neoplasm of upper-outer quadrant of right female breast: Secondary | ICD-10-CM | POA: Insufficient documentation

## 2022-03-18 DIAGNOSIS — Z171 Estrogen receptor negative status [ER-]: Secondary | ICD-10-CM | POA: Insufficient documentation

## 2022-03-18 DIAGNOSIS — E559 Vitamin D deficiency, unspecified: Secondary | ICD-10-CM | POA: Insufficient documentation

## 2022-03-18 DIAGNOSIS — Z9221 Personal history of antineoplastic chemotherapy: Secondary | ICD-10-CM | POA: Diagnosis not present

## 2022-03-18 DIAGNOSIS — R1010 Upper abdominal pain, unspecified: Secondary | ICD-10-CM | POA: Insufficient documentation

## 2022-03-18 DIAGNOSIS — R5383 Other fatigue: Secondary | ICD-10-CM | POA: Insufficient documentation

## 2022-03-18 DIAGNOSIS — N939 Abnormal uterine and vaginal bleeding, unspecified: Secondary | ICD-10-CM | POA: Diagnosis not present

## 2022-03-18 DIAGNOSIS — R0789 Other chest pain: Secondary | ICD-10-CM | POA: Diagnosis not present

## 2022-03-18 DIAGNOSIS — B0229 Other postherpetic nervous system involvement: Secondary | ICD-10-CM | POA: Diagnosis not present

## 2022-03-18 DIAGNOSIS — Z923 Personal history of irradiation: Secondary | ICD-10-CM | POA: Insufficient documentation

## 2022-03-18 DIAGNOSIS — E538 Deficiency of other specified B group vitamins: Secondary | ICD-10-CM | POA: Insufficient documentation

## 2022-03-18 LAB — CMP (CANCER CENTER ONLY)
ALT: 19 U/L (ref 0–44)
AST: 14 U/L — ABNORMAL LOW (ref 15–41)
Albumin: 4.2 g/dL (ref 3.5–5.0)
Alkaline Phosphatase: 53 U/L (ref 38–126)
Anion gap: 6 (ref 5–15)
BUN: 17 mg/dL (ref 8–23)
CO2: 26 mmol/L (ref 22–32)
Calcium: 9.5 mg/dL (ref 8.9–10.3)
Chloride: 106 mmol/L (ref 98–111)
Creatinine: 0.94 mg/dL (ref 0.44–1.00)
GFR, Estimated: >60 mL/min (ref >60)
Glucose, Bld: 98 mg/dL (ref 70–99)
Potassium: 3.7 mmol/L (ref 3.5–5.1)
Sodium: 138 mmol/L (ref 135–145)
Total Bilirubin: 0.4 mg/dL (ref 0.3–1.2)
Total Protein: 7.9 g/dL (ref 6.5–8.1)

## 2022-03-18 LAB — CBC WITH DIFFERENTIAL (CANCER CENTER ONLY)
Abs Immature Granulocytes: 0.02 10*3/uL (ref 0.00–0.07)
Basophils Absolute: 0.1 10*3/uL (ref 0.0–0.1)
Basophils Relative: 1 %
Eosinophils Absolute: 0 10*3/uL (ref 0.0–0.5)
Eosinophils Relative: 1 %
HCT: 41.3 % (ref 36.0–46.0)
Hemoglobin: 13.8 g/dL (ref 12.0–15.0)
Immature Granulocytes: 0 %
Lymphocytes Relative: 20 %
Lymphs Abs: 1.2 10*3/uL (ref 0.7–4.0)
MCH: 33.1 pg (ref 26.0–34.0)
MCHC: 33.4 g/dL (ref 30.0–36.0)
MCV: 99 fL (ref 80.0–100.0)
Monocytes Absolute: 0.7 10*3/uL (ref 0.1–1.0)
Monocytes Relative: 12 %
Neutro Abs: 4 10*3/uL (ref 1.7–7.7)
Neutrophils Relative %: 66 %
Platelet Count: 251 10*3/uL (ref 150–400)
RBC: 4.17 MIL/uL (ref 3.87–5.11)
RDW: 13.4 % (ref 11.5–15.5)
WBC Count: 6 10*3/uL (ref 4.0–10.5)
nRBC: 0 % (ref 0.0–0.2)

## 2022-03-18 LAB — IRON AND IRON BINDING CAPACITY (CC-WL,HP ONLY)
Iron: 144 ug/dL (ref 28–170)
Saturation Ratios: 48 % — ABNORMAL HIGH (ref 10.4–31.8)
TIBC: 298 ug/dL (ref 250–450)
UIBC: 154 ug/dL (ref 148–442)

## 2022-03-18 LAB — VITAMIN B12: Vitamin B-12: 176 pg/mL — ABNORMAL LOW (ref 180–914)

## 2022-03-18 LAB — TSH: TSH: 3.257 u[IU]/mL (ref 0.350–4.500)

## 2022-03-18 LAB — VITAMIN D 25 HYDROXY (VIT D DEFICIENCY, FRACTURES): Vit D, 25-Hydroxy: 15.06 ng/mL — ABNORMAL LOW (ref 30–100)

## 2022-03-18 LAB — T4, FREE: Free T4: 1.03 ng/dL (ref 0.61–1.12)

## 2022-03-18 LAB — FERRITIN: Ferritin: 161 ng/mL (ref 11–307)

## 2022-03-18 MED ORDER — GABAPENTIN 100 MG PO CAPS: 100.0000 mg | ORAL_CAPSULE | Freq: Three times a day (TID) | ORAL | 2 refills | Status: DC

## 2022-03-18 NOTE — Patient Instructions (Addendum)
We are ordering lab tests to evaluate reasons behind your fatigue and if your iron levels are low due to the bleeding you have been experiencing.  We will notify you when all the results have returned which will happen in about 2-3 days (unless there are emergent results).    I also ordered a CT scan of your chest/abdomen/pelvis.  This will evaluate your pain, abdominal discomfort and bleeding.  In the meantime, please contact the provider who conducted your colonoscopy to get back in for another colonoscopy.  After you undergo the CT scan, we will discuss whether to refer you to gynecology.    Information about the Gabapentin and shingles pain is noted below.       Gabapentin Capsules or Tablets What is this medication? GABAPENTIN (GA ba pen tin) treats nerve pain. It may also be used to prevent and control seizures in people with epilepsy. It works by calming overactive nerves in your body. This medicine may be used for other purposes; ask your health care provider or pharmacist if you have questions. COMMON BRAND NAME(S): Active-PAC with Gabapentin, Orpha Bur, Gralise, Neurontin What should I tell my care team before I take this medication? They need to know if you have any of these conditions: Alcohol or substance use disorder Kidney disease Lung or breathing disease Suicidal thoughts, plans, or attempt; a previous suicide attempt by you or a family member An unusual or allergic reaction to gabapentin, other medications, foods, dyes, or preservatives Pregnant or trying to get pregnant Breast-feeding How should I use this medication? Take this medication by mouth with a glass of water. Follow the directions on the prescription label. You can take it with or without food. If it upsets your stomach, take it with food. Take your medication at regular intervals. Do not take it more often than directed. Do not stop taking except on your care team's advice. If you are directed to break the 600  or 800 mg tablets in half as part of your dose, the extra half tablet should be used for the next dose. If you have not used the extra half tablet within 28 days, it should be thrown away. A special MedGuide will be given to you by the pharmacist with each prescription and refill. Be sure to read this information carefully each time. Talk to your care team about the use of this medication in children. While this medication may be prescribed for children as young as 3 years for selected conditions, precautions do apply. Overdosage: If you think you have taken too much of this medicine contact a poison control center or emergency room at once. NOTE: This medicine is only for you. Do not share this medicine with others. What if I miss a dose? If you miss a dose, take it as soon as you can. If it is almost time for your next dose, take only that dose. Do not take double or extra doses. What may interact with this medication? Alcohol Antihistamines for allergy, cough, and cold Certain medications for anxiety or sleep Certain medications for depression like amitriptyline, fluoxetine, sertraline Certain medications for seizures like phenobarbital, primidone Certain medications for stomach problems General anesthetics like halothane, isoflurane, methoxyflurane, propofol Local anesthetics like lidocaine, pramoxine, tetracaine Medications that relax muscles for surgery Opioid medications for pain Phenothiazines like chlorpromazine, mesoridazine, prochlorperazine, thioridazine This list may not describe all possible interactions. Give your health care provider a list of all the medicines, herbs, non-prescription drugs, or dietary supplements you use. Also  tell them if you smoke, drink alcohol, or use illegal drugs. Some items may interact with your medicine. What should I watch for while using this medication? Visit your care team for regular checks on your progress. You may want to keep a record at home  of how you feel your condition is responding to treatment. You may want to share this information with your care team at each visit. You should contact your care team if your seizures get worse or if you have any new types of seizures. Do not stop taking this medication or any of your seizure medications unless instructed by your care team. Stopping your medication suddenly can increase your seizures or their severity. This medication may cause serious skin reactions. They can happen weeks to months after starting the medication. Contact your care team right away if you notice fevers or flu-like symptoms with a rash. The rash may be red or purple and then turn into blisters or peeling of the skin. Or, you might notice a red rash with swelling of the face, lips or lymph nodes in your neck or under your arms. Wear a medical identification bracelet or chain if you are taking this medication for seizures. Carry a card that lists all your medications. This medication may affect your coordination, reaction time, or judgment. Do not drive or operate machinery until you know how this medication affects you. Sit up or stand slowly to reduce the risk of dizzy or fainting spells. Drinking alcohol with this medication can increase the risk of these side effects. Your mouth may get dry. Chewing sugarless gum or sucking hard candy, and drinking plenty of water may help. Watch for new or worsening thoughts of suicide or depression. This includes sudden changes in mood, behaviors, or thoughts. These changes can happen at any time but are more common in the beginning of treatment or after a change in dose. Call your care team right away if you experience these thoughts or worsening depression. If you become pregnant while using this medication, you may enroll in the [place] Pregnancy Registry by calling [phone]. This registry collects information about the safety of antiepileptic medication use  during pregnancy. What side effects may I notice from receiving this medication? Side effects that you should report to your care team as soon as possible: Allergic reactions or angioedema--skin rash, itching, hives, swelling of the face, eyes, lips, tongue, arms, or legs, trouble swallowing or breathing Rash, fever, and swollen lymph nodes Thoughts of suicide or self harm, worsening mood, feelings of depression Trouble breathing Unusual changes in mood or behavior in children after use such as difficulty concentrating, hostility, or restlessness Side effects that usually do not require medical attention (report to your care team if they continue or are bothersome): Dizziness Drowsiness Nausea Swelling of ankles, feet, or hands Vomiting This list may not describe all possible side effects. Call your doctor for medical advice about side effects. You may report side effects to FDA at 1-800-FDA-1088. Where should I keep my medication? Keep out of reach of children and pets. Store at room temperature between 15 and 30 degrees C (59 and 86 degrees F). Get rid of any unused medication after the expiration date. This medication may cause accidental overdose and death if taken by other adults, children, or pets. To get rid of medications that are no longer needed or have expired: Take the medication to a medication take-back program. Check with your pharmacy or law enforcement to find  a location. If you cannot return the medication, check the label or package insert to see if the medication should be thrown out in the garbage or flushed down the toilet. If you are not sure, ask your care team. If it is safe to put it in the trash, empty the medication out of the container. Mix the medication with cat litter, dirt, coffee grounds, or other unwanted substance. Seal the mixture in a bag or container. Put it in the trash. NOTE: This sheet is a summary. It may not cover all possible information. If you have  questions about this medicine, talk to your doctor, pharmacist, or health care provider.  2023 Elsevier/Gold Standard (2021-03-25 00:00:00) Postherpetic Neuralgia Postherpetic neuralgia (PHN) is nerve pain that occurs after a shingles infection. Shingles is a painful rash that appears on one area of the body, usually on the trunk or face. Shingles is caused by the varicella-zoster virus. This is the same virus that causes chickenpox. In people who have had chickenpox, the virus can resurface years later and cause shingles. PHN appears in the same area where you had the shingles rash. The pain usually goes away after the rash disappears. You may have PHN if you continue to have pain 3 months after your shingles rash has gone away. What are the causes? This condition is caused by damage to your nerves due to inflammation from the varicella-zoster virus. The damage makes your nerves overly sensitive. What increases the risk? The following factors may make you more likely to develop this condition: Being older than 77 years of age. Having severe pain before your shingles rash starts. Having a severe rash. Having shingles in and around the eye area. Having a disease or taking medicine that causes you to have a weakened disease-fighting system (immune system). What are the signs or symptoms? The main symptom of this condition is pain. The pain may: Often be severe and may be described as stabbing, burning, shooting, or feeling like an electric shock. Come and go, or it may be there all the time. Be triggered by light touches on the skin or changes in temperature. You may have itching along with the pain. How is this diagnosed? This condition may be diagnosed based on your symptoms and your history of shingles. Lab studies and other diagnostic tests are usually not needed. How is this treated? There is no cure for this condition. Treatment for PHN will focus on pain relief. Over-the-counter pain  relievers do not usually relieve PHN pain. You may need to work with a pain specialist. Treatment may include: Anti-seizure medicines to relieve nerve pain. Antidepressant medicines to help with pain and improve sleep. A numbing patch worn on the skin (lidocaine patch). Strong pain relievers (opioids). Injections of numbing medicine or anti-inflammatory medicines around irritated nerves. Injections of botulinum toxin to block pain signals between nerves and muscles. Follow these instructions at home: Medicines Take over-the-counter and prescription medicines only as told by your health care provider. Ask your health care provider if the medicine prescribed to you: Requires you to avoid driving or using machinery. Can cause constipation. You may need to take these actions to prevent or treat constipation: Drink enough fluid to keep your urine pale yellow. Take over-the-counter or prescription medicines. Eat foods that are high in fiber, such as beans, whole grains, and fresh fruits and vegetables. Limit foods that are high in fat and processed sugars, such as fried or sweet foods. Managing pain  If directed, put ice  on the painful area. To do this: Put ice in a plastic bag. Place a towel between your skin and the bag. Leave the ice on for 20 minutes, 2-3 times a day. Remove the ice if your skin turns bright red. This is very important. If you cannot feel pain, heat, or cold, you have a greater risk of damage to the area. Cover sensitive areas with a bandage (dressing) to reduce friction from clothing rubbing on the area. General instructions It may take a long time to recover from PHN. Work closely with your health care provider and develop a good support system at home. You may consider joining a support group. Wear loose, comfortable clothing. Talk to your health care provider if you feel depressed or desperate. Living with long-term pain can be depressing. Keep all follow-up visits.  This is important. How is this prevented? Getting a vaccination for shingles can prevent PHN. The shingles vaccine is recommended for people older than age 31. It may prevent shingles and may also lower your risk of PHN if you do get shingles. Contact a health care provider if: Your medicine is not helping. You are struggling to manage your pain at home. Get help right away if: You have thoughts about hurting yourself or others. If you ever feel like you may hurt yourself or others, or have thoughts about taking your own life, get help right away. Go to your nearest emergency department or: Call your local emergency services (911 in the [place] Call a suicide crisis helpline, such as the [facility] at 702-687-7663 or 988 in the [place]. This is open 24 hours a day in the [place] Text the Crisis Text Line at 973-199-9062 (in the [place].). Summary Postherpetic neuralgia (PHN) is a very painful disorder that can occur after an episode of shingles. The pain is often severe and may be described as stabbing, burning, shooting, or feeling like an electric shock. Prescription medicines can be helpful in managing persistent pain. Getting a vaccination for shingles can prevent PHN. This vaccine is recommended for people older than age 60. This information is not intended to replace advice given to you by your health care provider. Make sure you discuss any questions you have with your health care provider. Document Revised: 04/17/2021 Document Reviewed: 09/17/2020 Elsevier Patient Education  [place].

## 2022-03-18 NOTE — Assessment & Plan Note (Addendum)
She has no radiographic signs of breast cancer recurrence.  We are doing restaging CTs because of the vaginal bleeding discomfort and chest pain.  It is unclear if this is breast cancer recurrence or potentially an alternate etiology or malignancy.  I recommended that she go ahead and get in with her previous gastroenterologist to undergo repeat colonoscopy.  Once we receive imaging results we will determine whether to send her to gynecology.  Due to the level of fatigue she is experiencing I have ordered lab testing to further evaluate this in detail.  We will touch base with her after receiving all of these results and will determine if her follow-up with Dr. Lindi Adie in November 2023 needs to be moved forward.

## 2022-03-18 NOTE — Progress Notes (Signed)
[facility] Follow up:    Veronica Pain, FNP [facility] [address] [place] [address]   DIAGNOSIS:  Cancer Staging  Malignant neoplasm of upper-outer quadrant of right breast in female, estrogen receptor negative ([place]) Staging form: Breast, AJCC 8th Edition - Clinical: Stage IIIC (cT2, cN2, cM0, G3, ER-, PR-, HER2-) - Unsigned Histologic grading system: 3 grade system - Pathologic: No Stage Recommended (ypT2, pN0, cM0, G3, ER-, PR-, HER2-) - Unsigned Stage prefix: Post-therapy Histologic grading system: 3 grade system   SUMMARY OF ONCOLOGIC HISTORY: Oncology History  Malignant neoplasm of upper-outer quadrant of right breast in female, estrogen receptor negative ([place])  11/25/2017 Initial Diagnosis   76 y.o. [name], [place] woman status post right breast upper outer quadrant biopsy 11/25/2017 for a clinical T2 N2, stage IIIc invasive ductal carcinoma, grade 3, triple negative, with an MIB-1 of 80%.   (1) staging studies:             (a) chest CT 12/10/2017 scan showed no visceral metastatic disease             (b) bone scan 12/10/2017 showed uptake at T11, with sclerosis.             (c) CA-27-29 on 12/05/2017 was 19.0             (d) spinal MRI in [place] reportedly showed only arthrtis at T11     01/04/2018 - 06/14/2018 Neo-Adjuvant Chemotherapy   (2) neoadjuvant chemotherapy consisting of carboplatin/ paclitaxel x 12 (received 9) started 01/04/2018, discontinued 03/15/2018, followed by cyclophosphamide and doxorubicin given every 3 weeks x 4 starting 04/05/2018, completed 06/14/2018             (a) carboplatin/paclitaxel changed to carboplatin/gemcitabine after 8 cycles due to neuroapthy             (b) only able to tolerate 1 cycle of Gemcitabine/Carboplatin due to rash             (a) echocardiogram on 04/02/18 demonstrates a LVEF of 60-65%   06/28/2018 Surgery   status post right lumpectomy and sentinel lymph node sampling on showing a residual 2.2 cm, grade 3  invasive ductal carcinoma, with negative margins and 0 of 3 sentinel lymph nodes removed involved (ypT2 ypN0)   08/16/2018 - 09/08/2018 Radiation Therapy   Treatment site Treatment Technique/Modality Energy Dose per fraction Total number of fractions Total dose Start date End date  Right Breast 3D CRT 6 MV 266 cGy 16 4256 cGy 08/16/2018   09/08/2018  Lumpectomy boost Enface Electrons 15 MeV 200 cGy 5 1000 cGy    11/15/2018 Miscellaneous   pembrolizumab started (not as part of study) 11/15/2018, to be repeated every 21 days for 1 year   08/09/2019 Imaging   additional studies             (a) chest CT scan 08/09/2019 showed no evidence of metastases.  A left renal cyst was noted             (b) abdominal MRI 08/23/2019 showed a benign Bosniak 2 renal cyst and additional Bosniak 1 renal cysts.  There was no evidence of metastatic disease     CURRENT THERAPY: observation  INTERVAL HISTORY: Veronica Price 76 y.o. female returns for follow-up of right chest wall Price and upper abdominal Price.  This has been going on since May 2019 this year.  She developed an accompanying rash and was diagnosed with shingles locally.  She took Valtrex.  She also has noted some vaginal spotting that has been occurring since January 2023.  She says her colonoscopy was about 5 to 6 years ago and they noted several polyps that had to be removed.  She has been increasingly fatigued and has noticed a decreased appetite.   Her most recent mammogram was completed on September 05, 2021 and it was negative for malignancy showing breast density category C.  She was previously a Dr. Jana Hakim patient and is now establishing with Dr. Lindi Adie and her appointment with Dr. Lindi Adie is November 2023.  Veronica Price's weight is stable.  At her last visit in November she was hypertensive with a blood pressure of 178/94 today that is improved her blood pressure is 128/64.   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  MEDICAL HISTORY: Past Medical History:  Diagnosis Date   Arthritis 2003   Breast cancer ([place]) 11/25/2017   Right breast   Cataract 2016   "early" per patient-no surgery yet    GERD (gastroesophageal reflux disease) 2003   per patient report   Hypothyroidism 10/20/2018   Port-A-Cath in place 01/04/2018    SURGICAL HISTORY: Past Surgical History:  Procedure Laterality Date   ABDOMINAL HYSTERECTOMY  2002   Total hysterectomy in 2002   BREAST LUMPECTOMY WITH RADIOACTIVE SEED AND SENTINEL LYMPH NODE BIOPSY Right 06/28/2018   Procedure: BREAST LUMPECTOMY WITH RADIOACTIVE SEED AND SENTINEL LYMPH NODE BIOPSY;  Surgeon: [name], MD;  Location: [place];  Service: General;  Laterality: Right;   PORTACATH PLACEMENT N/A 12/29/2017   Procedure: INSERTION PORT-A-CATH LEFT SUBCLAVIAN;  Surgeon: [name], MD;  Location: [place];  Service: General;  Laterality: N/A;   TOTAL KNEE ARTHROPLASTY Right 08/03/2020   Procedure: TOTAL KNEE ARTHROPLASTY;  Surgeon: Earlie Server, MD;  Location: [facility];  Service: Orthopedics;  Laterality: Right;   TOTAL KNEE ARTHROPLASTY Left 11/09/2020   Procedure: TOTAL KNEE ARTHROPLASTY;  Surgeon: Earlie Server, MD;  Location[facility];  Service: Orthopedics;  Laterality: Left;    SOCIAL HISTORY: Social History   Socioeconomic History   Marital status: Married    Spouse name: Not on file   Number of children: Not on file   Years of education: Not on file   Highest education level: Not on file  Occupational History   Not on file  Tobacco  Use   Smoking status: Never   Smokeless tobacco: Never  Vaping Use   Vaping Use: Never used  Substance and Sexual Activity   Alcohol use: Never   Drug use: Never   Sexual activity: Not on file  Other Topics Concern   Not on file  Social History Narrative   Not on file   Social Determinants of Health   Financial Resource Strain: Not on file  Food Insecurity: Not on file  Transportation Needs: Not on file  Physical Activity: Not on file  Stress: Not on file  Social Connections: Not on file  Intimate Partner Violence: Not on file    FAMILY HISTORY: Family History  Problem Relation Age of Onset   Diabetes Father  Heart attack Father     Review of Systems  Constitutional:  Positive for appetite change and fatigue. Negative for chills, fever and unexpected weight change.  HENT:   Negative for hearing loss, lump/mass and trouble swallowing.   Eyes:  Negative for eye problems and icterus.  Respiratory:  Negative for chest tightness, cough and shortness of breath.   Cardiovascular:  Negative for chest Price, leg swelling and palpitations.  Gastrointestinal:  Negative for abdominal distention, abdominal Price, constipation, diarrhea, nausea and vomiting.  Endocrine: Negative for hot flashes.  Genitourinary:  Positive for pelvic Price and vaginal bleeding. Negative for difficulty urinating.   Musculoskeletal:  Negative for arthralgias.  Skin:  Negative for itching and rash.  Neurological:  Negative for dizziness, extremity weakness, headaches and numbness.  Hematological:  Negative for adenopathy. Does not bruise/bleed easily.  Psychiatric/Behavioral:  Negative for depression. The patient is not nervous/anxious.       PHYSICAL EXAMINATION  ECOG PERFORMANCE STATUS: 1 - Symptomatic but completely ambulatory  Vitals:   03/18/22 0753  BP: 128/64  Pulse: 90  Resp: 18  Temp: (!) 97.2 F (36.2 C)  SpO2: 98%    Physical Exam Constitutional:      General: She is not in  acute distress.    Appearance: Normal appearance. She is not toxic-appearing.  HENT:     Head: Normocephalic and atraumatic.  Eyes:     General: No scleral icterus. Cardiovascular:     Rate and Rhythm: Normal rate and regular rhythm.     Pulses: Normal pulses.     Heart sounds: Normal heart sounds.  Pulmonary:     Effort: Pulmonary effort is normal.     Breath sounds: Normal breath sounds.  Chest:       Comments: Crusted over shingles rash noted in dermatome per the picture above.  It is wrapping from the anterior breastbone to the posterior thoracic spine in a linear pattern. Abdominal:     General: Abdomen is flat. Bowel sounds are normal. There is no distension.     Palpations: Abdomen is soft.     Tenderness: There is no abdominal tenderness.  Musculoskeletal:        General: No swelling.     Cervical back: Neck supple.  Lymphadenopathy:     Cervical: No cervical adenopathy.  Skin:    General: Skin is warm and dry.     Findings: No rash.  Neurological:     General: No focal deficit present.     Mental Status: She is alert.  Psychiatric:        Mood and Affect: Mood normal.        Behavior: Behavior normal.     LABORATORY DATA:  CBC, CMP, ferritin, iron, TIBC, vitamin B12, vitamin D, TSH, and free T4 are all being drawn after today's appointment.    ASSESSMENT and THERAPY PLAN:   Malignant neoplasm of upper-outer quadrant of right breast in female, estrogen receptor negative ([place]) She has no radiographic signs of breast cancer recurrence.  We are doing restaging CTs because of the vaginal bleeding discomfort and chest Price.  It is unclear if this is breast cancer recurrence or potentially an alternate etiology or malignancy.  I recommended that she go ahead and get in with her previous gastroenterologist to undergo repeat colonoscopy.  Once we receive imaging results we will determine whether to send her to gynecology.  Due to the level of fatigue she is  experiencing I have ordered lab testing to  further evaluate this in detail.  We will touch base with her after receiving all of these results and will determine if her follow-up with Dr. Lindi Adie in November 2023 needs to be moved forward.   All questions were answered. The patient knows to call the clinic with any problems, questions or concerns. We can certainly see the patient much sooner if necessary.  Total encounter time:45 minutes*in face-to-face visit time, chart review, lab review, care coordination, order entry, and documentation of the encounter time.    Wilber Bihari, NP 03/18/22 8:49 AM Medical Oncology and Hematology [facility] [place], [place] [address] Tel. [phone]    Fax. [phone]  *Total Encounter Time as defined by the Centers for Medicare and Medicaid Services includes, in addition to the face-to-face time of a patient visit (documented in the note above) non-face-to-face time: obtaining and reviewing outside history, ordering and reviewing medications, tests or procedures, care coordination (communications with other health care professionals or caregivers) and documentation in the medical record.

## 2022-03-19 ENCOUNTER — Other Ambulatory Visit: Payer: Self-pay | Admitting: Adult Health

## 2022-03-19 ENCOUNTER — Telehealth: Payer: Self-pay

## 2022-03-19 DIAGNOSIS — E559 Vitamin D deficiency, unspecified: Secondary | ICD-10-CM

## 2022-03-19 DIAGNOSIS — Z171 Estrogen receptor negative status [ER-]: Secondary | ICD-10-CM

## 2022-03-19 MED ORDER — ERGOCALCIFEROL 1.25 MG (50000 UT) PO CAPS: 50000.0000 [IU] | ORAL_CAPSULE | ORAL | 1 refills | Status: DC

## 2022-03-19 NOTE — Telephone Encounter (Signed)
Spoke with pt regarding lab results per NP. Pt has an overall understanding. Also spoke with Gregary Signs, RN at [facility]- [place], [place]- Dr Ozella Rocks. Faxed results to [phone] and Gregary Signs states they will contact the pt to bring her in for B12 inj,  Message also sent to PA team regarding order for CT CAP to obtain PA so we can schedule scan. Pt is aware and knows we will contact her with an appt as soon as PA is obtained.   Fax confirmation received from [facility].

## 2022-03-19 NOTE — Telephone Encounter (Signed)
-----   Message from Gardenia Phlegm, NP sent at 03/19/2022  7:57 AM EDT ----- Please call patient and let her know that both her vitamin D and B12 are low which can definitely contribute to fatigue.  I would recommend her taking vitamin D prescription that I called in.  She would feel better faster if she started a B12 injection regimen.  Her primary care provider may be able to do this locally if she desires and we can fax those results if she would like.  Her other labs were normal. ----- Message ----- From: Buel Ream, Lab In [place] Sent: 03/18/2022   9:28 AM EDT To: Gardenia Phlegm, NP

## 2022-03-25 ENCOUNTER — Encounter (HOSPITAL_COMMUNITY): Payer: Self-pay

## 2022-03-25 ENCOUNTER — Ambulatory Visit (HOSPITAL_COMMUNITY)
Admission: RE | Admit: 2022-03-25 | Discharge: 2022-03-25 | Disposition: A | Discharge status: Home / Self Care | Payer: Medicare PPO | Source: Ambulatory Visit | Attending: Adult Health | Admitting: Adult Health

## 2022-03-25 DIAGNOSIS — Z171 Estrogen receptor negative status [ER-]: Secondary | ICD-10-CM | POA: Insufficient documentation

## 2022-03-25 DIAGNOSIS — C50411 Malignant neoplasm of upper-outer quadrant of right female breast: Secondary | ICD-10-CM | POA: Diagnosis present

## 2022-03-25 MED ORDER — SODIUM CHLORIDE (PF) 0.9 % IJ SOLN
INTRAMUSCULAR | Status: AC
  Filled 2022-03-25: qty 50

## 2022-03-25 MED ORDER — IOHEXOL 300 MG/ML  SOLN
100.0000 mL | Freq: Once | INTRAMUSCULAR | Status: AC | PRN
  Administered 2022-03-25: 100 mL via INTRAVENOUS

## 2022-03-31 ENCOUNTER — Inpatient Hospital Stay: Payer: Medicare PPO | Admitting: Adult Health

## 2022-03-31 ENCOUNTER — Other Ambulatory Visit: Payer: Self-pay

## 2022-03-31 VITALS — BP 132/77 | HR 70 | Temp 97.7°F | Resp 18 | Ht 63.0 in | Wt 171.6 lb

## 2022-03-31 DIAGNOSIS — E538 Deficiency of other specified B group vitamins: Secondary | ICD-10-CM

## 2022-03-31 DIAGNOSIS — E559 Vitamin D deficiency, unspecified: Secondary | ICD-10-CM | POA: Diagnosis not present

## 2022-03-31 DIAGNOSIS — C50411 Malignant neoplasm of upper-outer quadrant of right female breast: Secondary | ICD-10-CM

## 2022-03-31 DIAGNOSIS — Z171 Estrogen receptor negative status [ER-]: Secondary | ICD-10-CM | POA: Diagnosis not present

## 2022-03-31 NOTE — Progress Notes (Signed)
Veronica Price Cancer Follow up:    System, Provider Not In No address on file   DIAGNOSIS:  Cancer Staging  Malignant neoplasm of upper-outer quadrant of right breast in female, estrogen receptor negative (Veronica Price) Staging form: Breast, AJCC 8th Edition - Clinical: Stage IIIC (cT2, cN2, cM0, G3, ER-, PR-, HER2-) - Unsigned Histologic grading system: 3 grade system - Pathologic: No Stage Recommended (ypT2, pN0, cM0, G3, ER-, PR-, HER2-) - Unsigned Stage prefix: Post-therapy Histologic grading system: 3 grade system   SUMMARY OF ONCOLOGIC HISTORY: Oncology History  Malignant neoplasm of upper-outer quadrant of right breast in female, estrogen receptor negative (Veronica Price)  11/25/2017 Initial Diagnosis   75 y.o. Veronica Price, [place] woman status post right breast upper outer quadrant biopsy 11/25/2017 for a clinical T2 N2, stage IIIc invasive ductal carcinoma, grade 3, triple negative, with an MIB-1 of 80%.   (1) staging studies:             (a) chest CT 12/10/2017 scan showed no visceral metastatic disease             (b) bone scan 12/10/2017 showed uptake at T11, with sclerosis.             (c) CA-27-29 on 12/05/2017 was 19.0             (d) spinal MRI in [place] reportedly showed only arthrtis at T11     01/04/2018 - 06/14/2018 Neo-Adjuvant Chemotherapy   (2) neoadjuvant chemotherapy consisting of carboplatin/ paclitaxel x 12 (received 9) started 01/04/2018, discontinued 03/15/2018, followed by cyclophosphamide and doxorubicin given every 3 weeks x 4 starting 04/05/2018, completed 06/14/2018             (a) carboplatin/paclitaxel changed to carboplatin/gemcitabine after 8 cycles due to neuroapthy             (b) only able to tolerate 1 cycle of Gemcitabine/Carboplatin due to rash             (a) echocardiogram on 04/02/18 demonstrates a LVEF of 60-65%   06/28/2018 Surgery   status post right lumpectomy and sentinel lymph node sampling on showing a residual 2.2 cm, grade 3 invasive ductal  carcinoma, with negative margins and 0 of 3 sentinel lymph nodes removed involved (ypT2 ypN0)   08/16/2018 - 09/08/2018 Radiation Therapy   Treatment site Treatment Technique/Modality Energy Dose per fraction Total number of fractions Total dose Start date End date  Right Breast 3D CRT 6 MV 266 cGy 16 4256 cGy 08/16/2018   09/08/2018  Lumpectomy boost Enface Electrons 15 MeV 200 cGy 5 1000 cGy    11/15/2018 Miscellaneous   pembrolizumab started (not as part of study) 11/15/2018, to be repeated every 21 days for 1 year   08/09/2019 Imaging   additional studies             (a) chest CT scan 08/09/2019 showed no evidence of metastases.  A left renal cyst was noted             (b) abdominal MRI 08/23/2019 showed a benign Bosniak 2 renal cyst and additional Bosniak 1 renal cysts.  There was no evidence of metastatic disease     CURRENT THERAPY: Observation  INTERVAL HISTORY: Veronica Price 76 y.o. female returns for follow-up after seeing her couple weeks ago for persistent fatigue.  She had some questionable vaginal spotting as well.  Her ferritin was normal, vitamin D level was low at 15, B12 was decreased at 176, hemoglobin was normal along  with kidney and liver function.  CT chest/abd/pelvis was completed on 03/26/2022 and showed no evidence of metastatic disease.    Veronica Price has started on IM B12 injections and oral weekly vitamin d supplementation.  She is tolerating these well.  She continues to have some fatigue, but is grateful to know it is not because her cancer has returned.     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  MEDICAL HISTORY: Past Medical History:  Diagnosis Date   Arthritis 2003   Breast cancer ([place]) 11/25/2017   Right breast   Cataract 2016   "early" per patient-no surgery yet    GERD (gastroesophageal reflux disease) 2003   per patient report   Hypothyroidism 10/20/2018   Port-A-Cath in place 01/04/2018    SURGICAL HISTORY: Past Surgical History:  Procedure Laterality Date   ABDOMINAL HYSTERECTOMY  2002   Total hysterectomy in 2002   BREAST LUMPECTOMY WITH RADIOACTIVE SEED AND SENTINEL LYMPH NODE BIOPSY Right 06/28/2018   Procedure: BREAST LUMPECTOMY WITH RADIOACTIVE SEED AND SENTINEL LYMPH NODE BIOPSY;  Surgeon: [name], MD;  Location: [place];  Service: General;  Laterality: Right;   PORTACATH PLACEMENT N/A 12/29/2017   Procedure: INSERTION PORT-A-CATH LEFT SUBCLAVIAN;  Surgeon: [name], MD;  Location: [place];  Service: General;  Laterality: N/A;   TOTAL KNEE ARTHROPLASTY Right 08/03/2020   Procedure: TOTAL KNEE ARTHROPLASTY;  Surgeon: Earlie Server, MD;  [facility];  Service: Orthopedics;  Laterality: Right;   TOTAL KNEE ARTHROPLASTY Left 11/09/2020   Procedure: TOTAL KNEE ARTHROPLASTY;  Surgeon: Earlie Server, MD;  [facility];  Service: Orthopedics;  Laterality: Left;    SOCIAL HISTORY: Social History   Socioeconomic History   Marital status: Married    Spouse name: Not on file   Number of children: Not on file   Years of education: Not on file   Highest education level: Not on file  Occupational History   Not on file  Tobacco Use   Smoking status: Never   Smokeless tobacco: Never  Vaping Use   Vaping Use: Never used  Substance and Sexual Activity   Alcohol use: Never   Drug use: Never   Sexual activity: Not on file  Other Topics Concern   Not on file  Social History Narrative   Not on file    Social Determinants of Health   Financial Resource Strain: Not on file  Food Insecurity: Not on file  Transportation Needs: Not on file  Physical Activity: Not on file  Stress: Not on file  Social Connections: Not on file  Intimate Partner Violence: Not on file    FAMILY HISTORY: Family History  Problem Relation Age of Onset   Diabetes Father    Heart attack Father     Review of Systems  Constitutional:  Negative for appetite change, chills, fatigue, fever and unexpected weight change.  HENT:   Negative for hearing loss, lump/mass and trouble swallowing.   Eyes:  Negative for eye problems and icterus.  Respiratory:  Negative for chest tightness, cough and shortness of breath.   Cardiovascular:  Negative for chest pain, leg swelling and palpitations.  Gastrointestinal:  Negative for abdominal distention, abdominal pain, constipation, diarrhea, nausea and vomiting.  Endocrine: Negative for hot flashes.  Genitourinary:  Negative for difficulty urinating.   Musculoskeletal:  Negative for arthralgias.  Skin:  Negative for itching and rash.  Neurological:  Negative for dizziness, extremity weakness, headaches and numbness.  Hematological:  Negative for adenopathy. Does not bruise/bleed easily.  Psychiatric/Behavioral:  Negative for depression. The patient is not nervous/anxious.       PHYSICAL EXAMINATION  ECOG PERFORMANCE STATUS: 1 - Symptomatic but completely ambulatory  Vitals:   03/31/22 1447  BP: 132/77  Pulse: 70  Resp: 18  Temp: 97.7 F (36.5 C)  SpO2: 100%    Physical Exam Constitutional:      General: She is not in acute distress.    Appearance: Normal appearance. She is not toxic-appearing.  HENT:     Head: Normocephalic and atraumatic.  Eyes:     General: No scleral icterus. Cardiovascular:     Rate and Rhythm: Normal rate and regular rhythm.     Pulses: Normal pulses.     Heart sounds: Normal heart sounds.  Pulmonary:     Effort: Pulmonary  effort is normal.     Breath sounds: Normal breath sounds.  Abdominal:     General: Abdomen is flat. Bowel sounds are normal. There is no distension.     Palpations: Abdomen is soft.     Tenderness: There is no abdominal tenderness.  Musculoskeletal:        General: No swelling.     Cervical back: Neck supple.  Lymphadenopathy:     Cervical: No cervical adenopathy.  Skin:    General: Skin is warm and dry.     Findings: No rash.  Neurological:     General: No focal deficit present.     Mental Status: She is alert.  Psychiatric:        Mood and Affect: Mood normal.        Behavior: Behavior normal.     LABORATORY DATA:  CBC    Component Value Date/Time   WBC 6.0 03/18/2022 0853   WBC 4.3 11/09/2020 1033   RBC 4.17 03/18/2022 0853   HGB 13.8 03/18/2022 0853   HCT 41.3 03/18/2022 0853   PLT 251 03/18/2022 0853   MCV 99.0 03/18/2022 0853   MCH 33.1 03/18/2022 0853   MCHC 33.4 03/18/2022 0853   RDW 13.4 03/18/2022 0853   LYMPHSABS 1.2 03/18/2022 0853   MONOABS 0.7 03/18/2022 0853   EOSABS 0.0 03/18/2022 0853   BASOSABS 0.1 03/18/2022 0853    CMP     Component Value Date/Time   NA 138 03/18/2022 0853   K 3.7 03/18/2022 0853   CL 106 03/18/2022 0853   CO2 26 03/18/2022 0853   GLUCOSE 98 03/18/2022 0853   BUN 17 03/18/2022 0853   CREATININE 0.94 03/18/2022 0853   CALCIUM 9.5 03/18/2022 0853   PROT 7.9 03/18/2022 0853   ALBUMIN 4.2 03/18/2022 0853   AST 14 (L) 03/18/2022 0853   ALT 19 03/18/2022 0853   ALKPHOS 53 03/18/2022 0853   BILITOT 0.4 03/18/2022 0853   GFRNONAA >60 03/18/2022 0853   GFRAA >60 02/14/2020 1010   GFRAA >60 02/08/2019 0805           ASSESSMENT and THERAPY PLAN:   Vitamin D deficiency On weekly ergocalciferol with good tolerance.  I recommended that she continue this and recheck in 3-6 months.    Malignant neoplasm of upper-outer quadrant of right breast in female, estrogen receptor negative ([place]) Thaily has no sign of breast  cancer recurrence radiographically.  I reviewed her scans with her.  We will continue to see her in regular f/u as scheduled.    B12 deficiency She began Vitamin b12 injections and is tolerating these well.  We will recheck her b12 levels when she returns for her f/u.    All questions were answered. The patient knows to call the clinic with any problems, questions or concerns. We can certainly see the patient much sooner if necessary.  Total encounter time:30 minutes*in face-to-face visit time, chart review, lab review, care coordination, order entry, and documentation of the encounter time.    Wilber Bihari, NP 04/04/22 4:28 AM Medical Oncology and Hematology [facility] [place], [place] [address] Tel. [phone]    Fax. [phone]  *Total Encounter Time as defined by the Centers for Medicare and Medicaid Services includes, in addition to the face-to-face time of a patient visit (documented in the note above) non-face-to-face time: obtaining and reviewing outside history, ordering and reviewing medications, tests or procedures, care coordination (communications with other health care professionals or caregivers) and documentation in the medical record.

## 2022-04-04 DIAGNOSIS — E559 Vitamin D deficiency, unspecified: Secondary | ICD-10-CM | POA: Insufficient documentation

## 2022-04-04 DIAGNOSIS — E538 Deficiency of other specified B group vitamins: Secondary | ICD-10-CM | POA: Insufficient documentation

## 2022-04-04 NOTE — Assessment & Plan Note (Signed)
On weekly ergocalciferol with good tolerance.  I recommended that she continue this and recheck in 3-6 months.

## 2022-04-04 NOTE — Assessment & Plan Note (Signed)
Veronica Price has no sign of breast cancer recurrence radiographically.  I reviewed her scans with her.  We will continue to see her in regular f/u as scheduled.

## 2022-04-04 NOTE — Assessment & Plan Note (Signed)
She began Vitamin b12 injections and is tolerating these well.  We will recheck her b12 levels when she returns for her f/u.

## 2022-08-16 NOTE — Progress Notes (Signed)
Patient Care Team: System, Provider Not In as PCP - General [name], MD as Radiation Oncologist (Radiation Oncology) Earlie Server, MD as Consulting Physician (Orthopedic Surgery) Delice Bison Charlestine Massed, NP as Nurse Practitioner (Hematology and Oncology)  DIAGNOSIS: No diagnosis found.  SUMMARY OF ONCOLOGIC HISTORY: Oncology History  Malignant neoplasm of upper-outer quadrant of right breast in female, estrogen receptor negative ([facility])  11/25/2017 Initial Diagnosis   76 y.o. Veronica Price, [place] woman status post right breast upper outer quadrant biopsy 11/25/2017 for a clinical T2 N2, stage IIIc invasive ductal carcinoma, grade 3, triple negative, with an MIB-1 of 80%.   (1) staging studies:             (a) chest CT 12/10/2017 scan showed no visceral metastatic disease             (b) bone scan 12/10/2017 showed uptake at T11, with sclerosis.             (c) CA-27-29 on 12/05/2017 was 19.0             (d) spinal MRI in [place] reportedly showed only arthrtis at T11     01/04/2018 - 06/14/2018 Neo-Adjuvant Chemotherapy   (2) neoadjuvant chemotherapy consisting of carboplatin/ paclitaxel x 12 (received 9) started 01/04/2018, discontinued 03/15/2018, followed by cyclophosphamide and doxorubicin given every 3 weeks x 4 starting 04/05/2018, completed 06/14/2018             (a) carboplatin/paclitaxel changed to carboplatin/gemcitabine after 8 cycles due to neuroapthy             (b) only able to tolerate 1 cycle of Gemcitabine/Carboplatin due to rash             (a) echocardiogram on 04/02/18 demonstrates a LVEF of 60-65%   06/28/2018 Surgery   status post right lumpectomy and sentinel lymph node sampling on showing a residual 2.2 cm, grade 3 invasive ductal carcinoma, with negative margins and 0 of 3 sentinel lymph nodes removed involved (ypT2 ypN0)   08/16/2018 - 09/08/2018 Radiation Therapy   Treatment site Treatment Technique/Modality Energy Dose per fraction Total number of  fractions Total dose Start date End date  Right Breast 3D CRT 6 MV 266 cGy 16 4256 cGy 08/16/2018   09/08/2018  Lumpectomy boost Enface Electrons 15 MeV 200 cGy 5 1000 cGy    11/15/2018 Miscellaneous   pembrolizumab started (not as part of study) 11/15/2018, to be repeated every 21 days for 1 year   08/09/2019 Imaging   additional studies             (a) chest CT scan 08/09/2019 showed no evidence of metastases.  A left renal cyst was noted             (b) abdominal MRI 08/23/2019 showed a benign Bosniak 2 renal cyst and additional Bosniak 1 renal cysts.  There was no evidence of metastatic disease     CHIEF COMPLIANT: Follow-up triple negative breast cancer  INTERVAL HISTORY: Veronica Price is a 76 y.o. with the above-mentioned triple negative breast cancer. She presents to the clinic for a follow-up and Establish oncology care with Dr. Lindi Adie.     ALLERGIES:  is allergic to prednisone.  MEDICATIONS:  Current Outpatient Medications  Medication Sig Dispense Refill   cyanocobalamin (,VITAMIN B-12,) 1000 MCG/ML injection Inject into the muscle.     ergocalciferol (VITAMIN D2) 1.25 MG (50000 UT) capsule Take 1 capsule (50,000 Units total) by mouth once a week. 12 capsule 1  gabapentin (NEURONTIN) 100 MG capsule Take 1 capsule (100 mg total) by mouth 3 (three) times daily. 90 capsule 2   levothyroxine (SYNTHROID) 50 MCG tablet TAKE 1 TABLET BY MOUTH EVERY DAY BEFORE BREAKFAST 90 tablet 1   No current facility-administered medications for this visit.    PHYSICAL EXAMINATION: ECOG PERFORMANCE STATUS: {CHL ONC ECOG [phone]}  There were no vitals filed for this visit. There were no vitals filed for this visit.  BREAST:*** No palpable masses or nodules in either right or left breasts. No palpable axillary supraclavicular or infraclavicular adenopathy no breast tenderness or nipple discharge. (exam performed in the presence of a chaperone)  LABORATORY DATA:  I have reviewed the  data as listed    Latest Ref Rng & Units 03/18/2022    8:53 AM 08/20/2021    9:50 AM 11/06/2020    9:47 AM  CMP  Glucose 70 - 99 mg/dL 98  92  98   BUN 8 - 23 mg/dL '17  17  13   '$ Creatinine 0.44 - 1.00 mg/dL 0.94  0.86  0.90   Sodium 135 - 145 mmol/L 138  140  138   Potassium 3.5 - 5.1 mmol/L 3.7  4.1  4.6   Chloride 98 - 111 mmol/L 106  106  102   CO2 22 - 32 mmol/L '26  26  27   '$ Calcium 8.9 - 10.3 mg/dL 9.5  9.1  9.8   Total Protein 6.5 - 8.1 g/dL 7.9  7.8  8.1   Total Bilirubin 0.3 - 1.2 mg/dL 0.4  0.5  0.7   Alkaline Phos 38 - 126 U/L 53  66  70   AST 15 - 41 U/L '14  21  16   '$ ALT 0 - 44 U/L '19  14  14     '$ Lab Results  Component Value Date   WBC 6.0 03/18/2022   HGB 13.8 03/18/2022   HCT 41.3 03/18/2022   MCV 99.0 03/18/2022   PLT 251 03/18/2022   NEUTROABS 4.0 03/18/2022    ASSESSMENT & PLAN:  No problem-specific Assessment & Plan notes found for this encounter.    No orders of the defined types were placed in this encounter.  The patient has a good understanding of the overall plan. she agrees with it. she will call with any problems that may develop before the next visit here. Total time spent: 30 mins including face to face time and time spent for planning, charting and co-ordination of care   Suzzette Righter, [place] 08/16/22    I Gardiner Coins am scribing for Dr. Lindi Adie  ***

## 2022-08-18 ENCOUNTER — Inpatient Hospital Stay: Payer: Medicare PPO | Attending: Hematology and Oncology | Admitting: Hematology and Oncology

## 2022-08-18 ENCOUNTER — Other Ambulatory Visit: Payer: Self-pay

## 2022-08-18 ENCOUNTER — Inpatient Hospital Stay: Payer: Medicare PPO

## 2022-08-18 VITALS — BP 147/80 | HR 73 | Temp 97.5°F | Resp 18 | Ht 63.0 in | Wt 176.8 lb

## 2022-08-18 DIAGNOSIS — Z171 Estrogen receptor negative status [ER-]: Secondary | ICD-10-CM | POA: Insufficient documentation

## 2022-08-18 DIAGNOSIS — E538 Deficiency of other specified B group vitamins: Secondary | ICD-10-CM | POA: Diagnosis not present

## 2022-08-18 DIAGNOSIS — C50411 Malignant neoplasm of upper-outer quadrant of right female breast: Secondary | ICD-10-CM | POA: Diagnosis not present

## 2022-08-18 DIAGNOSIS — Z79899 Other long term (current) drug therapy: Secondary | ICD-10-CM | POA: Diagnosis not present

## 2022-08-18 DIAGNOSIS — E559 Vitamin D deficiency, unspecified: Secondary | ICD-10-CM | POA: Diagnosis not present

## 2022-08-18 LAB — CBC WITH DIFFERENTIAL (CANCER CENTER ONLY)
Abs Immature Granulocytes: 0.01 10*3/uL (ref 0.00–0.07)
Basophils Absolute: 0 10*3/uL (ref 0.0–0.1)
Basophils Relative: 1 %
Eosinophils Absolute: 0.1 10*3/uL (ref 0.0–0.5)
Eosinophils Relative: 3 %
HCT: 40.5 % (ref 36.0–46.0)
Hemoglobin: 13.7 g/dL (ref 12.0–15.0)
Immature Granulocytes: 0 %
Lymphocytes Relative: 22 %
Lymphs Abs: 1 10*3/uL (ref 0.7–4.0)
MCH: 33.3 pg (ref 26.0–34.0)
MCHC: 33.8 g/dL (ref 30.0–36.0)
MCV: 98.3 fL (ref 80.0–100.0)
Monocytes Absolute: 0.5 10*3/uL (ref 0.1–1.0)
Monocytes Relative: 12 %
Neutro Abs: 2.7 10*3/uL (ref 1.7–7.7)
Neutrophils Relative %: 62 %
Platelet Count: 223 10*3/uL (ref 150–400)
RBC: 4.12 MIL/uL (ref 3.87–5.11)
RDW: 13.7 % (ref 11.5–15.5)
WBC Count: 4.4 10*3/uL (ref 4.0–10.5)
nRBC: 0 % (ref 0.0–0.2)

## 2022-08-18 LAB — CMP (CANCER CENTER ONLY)
ALT: 13 U/L (ref 0–44)
AST: 15 U/L (ref 15–41)
Albumin: 4.4 g/dL (ref 3.5–5.0)
Alkaline Phosphatase: 58 U/L (ref 38–126)
Anion gap: 4 — ABNORMAL LOW (ref 5–15)
BUN: 15 mg/dL (ref 8–23)
CO2: 31 mmol/L (ref 22–32)
Calcium: 9.5 mg/dL (ref 8.9–10.3)
Chloride: 105 mmol/L (ref 98–111)
Creatinine: 0.96 mg/dL (ref 0.44–1.00)
GFR, Estimated: >60 mL/min (ref >60)
Glucose, Bld: 80 mg/dL (ref 70–99)
Potassium: 4 mmol/L (ref 3.5–5.1)
Sodium: 140 mmol/L (ref 135–145)
Total Bilirubin: 0.4 mg/dL (ref 0.3–1.2)
Total Protein: 8.1 g/dL (ref 6.5–8.1)

## 2022-08-18 LAB — VITAMIN B12: Vitamin B-12: 1266 pg/mL — ABNORMAL HIGH (ref 180–914)

## 2022-08-18 LAB — VITAMIN D 25 HYDROXY (VIT D DEFICIENCY, FRACTURES): Vit D, 25-Hydroxy: 68.11 ng/mL (ref 30–100)

## 2022-08-18 MED ORDER — ERGOCALCIFEROL 1.25 MG (50000 UT) PO CAPS: 50000.0000 [IU] | ORAL_CAPSULE | ORAL | 3 refills | Status: AC

## 2022-08-18 NOTE — Assessment & Plan Note (Addendum)
Dr. Jana Hakim patient establishing oncology care with me 11/25/2017: Right breast UOQ T2N2 stage IIIc grade 3 IDC triple negative Ki-67 80% 01/04/2018 neoadjuvant Taxol carbo x9 followed by Adriamycin Cytoxan x4 completed 06/14/2018 06/28/2018: Right lumpectomy: Residual 2.2 cm grade 3 IDC with negative margins 0/3 lymph nodes negative December 2019: Completed adjuvant radiation at [facility] 18: Was randomized to observation.  Pembrolizumab started 11/15/2018-05/24/2019  Breast cancer surveillance: Breast exam 08/18/2022: Benign Mammogram 09/05/2021 at [facility], benign breast density category C  Return to clinic in 1 year for follow-up

## 2023-08-19 ENCOUNTER — Inpatient Hospital Stay: Payer: Medicare PPO | Attending: Hematology and Oncology | Admitting: Hematology and Oncology

## 2023-08-19 VITALS — BP 163/76 | HR 60 | Temp 97.7°F | Resp 18 | Ht 63.0 in | Wt 179.3 lb

## 2023-08-19 DIAGNOSIS — Z171 Estrogen receptor negative status [ER-]: Secondary | ICD-10-CM | POA: Diagnosis not present

## 2023-08-19 DIAGNOSIS — Z9221 Personal history of antineoplastic chemotherapy: Secondary | ICD-10-CM | POA: Diagnosis not present

## 2023-08-19 DIAGNOSIS — E039 Hypothyroidism, unspecified: Secondary | ICD-10-CM | POA: Diagnosis not present

## 2023-08-19 DIAGNOSIS — E538 Deficiency of other specified B group vitamins: Secondary | ICD-10-CM | POA: Insufficient documentation

## 2023-08-19 DIAGNOSIS — Z923 Personal history of irradiation: Secondary | ICD-10-CM | POA: Diagnosis not present

## 2023-08-19 DIAGNOSIS — G629 Polyneuropathy, unspecified: Secondary | ICD-10-CM | POA: Insufficient documentation

## 2023-08-19 DIAGNOSIS — C50411 Malignant neoplasm of upper-outer quadrant of right female breast: Secondary | ICD-10-CM | POA: Diagnosis not present

## 2023-08-19 DIAGNOSIS — Z853 Personal history of malignant neoplasm of breast: Secondary | ICD-10-CM | POA: Insufficient documentation

## 2023-08-19 DIAGNOSIS — Z08 Encounter for follow-up examination after completed treatment for malignant neoplasm: Secondary | ICD-10-CM | POA: Insufficient documentation

## 2023-08-19 DIAGNOSIS — E559 Vitamin D deficiency, unspecified: Secondary | ICD-10-CM | POA: Diagnosis not present

## 2023-08-19 NOTE — Assessment & Plan Note (Addendum)
Dr. Darnelle Catalan patient previously 11/25/2017: Right breast UOQ T2N2 stage IIIc grade 3 IDC triple negative Ki-67 80% 01/04/2018 neoadjuvant Taxol carbo x9 followed by Adriamycin Cytoxan x4 completed 06/14/2018 06/28/2018: Right lumpectomy: Residual 2.2 cm grade 3 IDC with negative margins 0/3 lymph nodes negative December 2019: Completed adjuvant radiation at [facility] 18: Was randomized to observation.  Pembrolizumab started 11/15/2018-05/24/2019   Breast cancer surveillance: Breast exam 08/19/2023: Benign Mammogram 09/05/2021 at [facility], benign breast density category C  She does not like to get mammograms because a cause pain.  However she will call and make an appointment for next year.   Return to clinic in 1 year for follow-up

## 2023-08-19 NOTE — Progress Notes (Signed)
Patient Care Team: System, Provider Not In as PCP - General Stanford Breed, MD as Radiation Oncologist (Radiation Oncology) Frederico Hamman, MD as Consulting Physician (Orthopedic Surgery) Axel Filler Larna Daughters, NP as Nurse Practitioner (Hematology and Oncology)  DIAGNOSIS:  Encounter Diagnosis  Name Primary?   Malignant neoplasm of upper-outer quadrant of right breast in female, estrogen receptor negative (H[facility]) Yes    SUMMARY OF ONCOLOGIC HISTORY: Oncology History  Malignant neoplasm of upper-outer quadrant of right breast in female, estrogen receptor negative ([facility])  11/25/2017 Initial Diagnosis   77 y.o. Veronica Price, [place] woman status post right breast upper outer quadrant biopsy 11/25/2017 for a clinical T2 N2, stage IIIc invasive ductal carcinoma, grade 3, triple negative, with an MIB-1 of 80%.   (1) staging studies:             (a) chest CT 12/10/2017 scan showed no visceral metastatic disease             (b) bone scan 12/10/2017 showed uptake at T11, with sclerosis.             (c) CA-27-29 on 12/05/2017 was 19.0             (d) spinal MRI in [place] reportedly showed only arthrtis at T11     01/04/2018 - 06/14/2018 Neo-Adjuvant Chemotherapy   (2) neoadjuvant chemotherapy consisting of carboplatin/ paclitaxel x 12 (received 9) started 01/04/2018, discontinued 03/15/2018, followed by cyclophosphamide and doxorubicin given every 3 weeks x 4 starting 04/05/2018, completed 06/14/2018             (a) carboplatin/paclitaxel changed to carboplatin/gemcitabine after 8 cycles due to neuroapthy             (b) only able to tolerate 1 cycle of Gemcitabine/Carboplatin due to rash             (a) echocardiogram on 04/02/18 demonstrates a LVEF of 60-65%   06/28/2018 Surgery   status post right lumpectomy and sentinel lymph node sampling on showing a residual 2.2 cm, grade 3 invasive ductal carcinoma, with negative margins and 0 of 3 sentinel lymph nodes removed involved (ypT2 ypN0)    08/16/2018 - 09/08/2018 Radiation Therapy   Treatment site Treatment Technique/Modality Energy Dose per fraction Total number of fractions Total dose Start date End date  Right Breast 3D CRT 6 MV 266 cGy 16 4256 cGy 08/16/2018   09/08/2018  Lumpectomy boost Enface Electrons 15 MeV 200 cGy 5 1000 cGy    11/15/2018 Miscellaneous   pembrolizumab started (not as part of study) 11/15/2018, to be repeated every 21 days for 1 year   08/09/2019 Imaging   additional studies             (a) chest CT scan 08/09/2019 showed no evidence of metastases.  A left renal cyst was noted             (b) abdominal MRI 08/23/2019 showed a benign Bosniak 2 renal cyst and additional Bosniak 1 renal cysts.  There was no evidence of metastatic disease     CHIEF COMPLIANT: Surveillance of breast cancer  HISTORY OF PRESENT ILLNESS:  History of Present Illness   The patient, a six-year breast cancer survivor, presents for her annual follow-up. Over the past year, she reports her health has been generally good, but she has been experiencing headaches. She has been avoiding mammograms for the past two years due to the associated pain, despite understanding the importance of the procedure for her ongoing health monitoring. She has  been managing her health with B12 tablets and thyroid medication. She was prescribed gabapentin for neuropathy, but she has not been taking it. She reports experiencing numbness in her feet and hands, describing the sensation in her feet as feeling like cardboard. She also mentions that she occasionally experiences cramps, which she attributes to not drinking enough water.         ALLERGIES:  is allergic to prednisone.  MEDICATIONS:  Current Outpatient Medications  Medication Sig Dispense Refill   cyanocobalamin (,VITAMIN B-12,) 1000 MCG/ML injection Inject into the muscle.     ergocalciferol (VITAMIN D2) 1.25 MG (50000 UT) capsule Take 1 capsule (50,000 Units total) by mouth once a week.  12 capsule 3   levothyroxine (SYNTHROID) 50 MCG tablet TAKE 1 TABLET BY MOUTH EVERY DAY BEFORE BREAKFAST 90 tablet 1   No current facility-administered medications for this visit.    PHYSICAL EXAMINATION: ECOG PERFORMANCE STATUS: 1 - Symptomatic but completely ambulatory  Vitals:   08/19/23 1040  BP: (!) 163/76  Pulse: 60  Resp: 18  Temp: 97.7 F (36.5 C)  SpO2: 100%   Filed Weights   08/19/23 1040  Weight: 179 lb 4.8 oz (81.3 kg)    Physical Exam   BREAST: No lumps palpated, no abnormalities detected. Scar tissue present, described as fine.      (exam performed in the presence of a chaperone)  LABORATORY DATA:  I have reviewed the data as listed    Latest Ref Rng & Units 08/18/2022    9:14 AM 03/18/2022    8:53 AM 08/20/2021    9:50 AM  CMP  Glucose 70 - 99 mg/dL 80  98  92   BUN 8 - 23 mg/dL 15  17  17    Creatinine 0.44 - 1.00 mg/dL 1.61  0.96  0.45   Sodium 135 - 145 mmol/L 140  138  140   Potassium 3.5 - 5.1 mmol/L 4.0  3.7  4.1   Chloride 98 - 111 mmol/L 105  106  106   CO2 22 - 32 mmol/L 31  26  26    Calcium 8.9 - 10.3 mg/dL 9.5  9.5  9.1   Total Protein 6.5 - 8.1 g/dL 8.1  7.9  7.8   Total Bilirubin 0.3 - 1.2 mg/dL 0.4  0.4  0.5   Alkaline Phos 38 - 126 U/L 58  53  66   AST 15 - 41 U/L 15  14  21    ALT 0 - 44 U/L 13  19  14      Lab Results  Component Value Date   WBC 4.4 08/18/2022   HGB 13.7 08/18/2022   HCT 40.5 08/18/2022   MCV 98.3 08/18/2022   PLT 223 08/18/2022   NEUTROABS 2.7 08/18/2022    ASSESSMENT & PLAN:  Malignant neoplasm of upper-outer quadrant of right breast in female, estrogen receptor negative (HCC) Dr. Darnelle Catalan patient previously 11/25/2017: Right breast UOQ T2N2 stage IIIc grade 3 IDC triple negative Ki-67 80% 01/04/2018 neoadjuvant Taxol carbo x9 followed by Adriamycin Cytoxan x4 completed 06/14/2018 06/28/2018: Right lumpectomy: Residual 2.2 cm grade 3 IDC with negative margins 0/3 lymph nodes negative December 2019: Completed  adjuvant radiation at [facility] 18: Was randomized to observation.  Pembrolizumab started 11/15/2018-05/24/2019   Breast cancer surveillance: Breast exam 08/19/2023: Benign Mammogram 09/05/2021 at [facility], benign breast density category C  She does not like to get mammograms because a cause pain.  However she will call and make an  appointment for next year.   Hypothyroidism Patient is compliant with daily thyroid medication. -Continue current regimen of thyroid medication.  Vitamin B12 Deficiency Patient has switched from injections to oral tablets due to cost. -Continue current regimen of B12 tablets.  Vitamin D Deficiency Patient is compliant with daily supplementation. -Continue current regimen of Vitamin D supplementation.        Return to clinic in 1 year for follow-up    No orders of the defined types were placed in this encounter.  The patient has a good understanding of the overall plan. she agrees with it. she will call with any problems that may develop before the next visit here. Total time spent: 30 mins including face to face time and time spent for planning, charting and co-ordination of care   Tamsen Meek, MD 08/19/23

## 2023-08-27 ENCOUNTER — Other Ambulatory Visit: Payer: Self-pay | Admitting: *Deleted

## 2023-08-27 DIAGNOSIS — C50411 Malignant neoplasm of upper-outer quadrant of right female breast: Secondary | ICD-10-CM

## 2023-08-27 NOTE — Progress Notes (Signed)
RN successfully faxed mammogram orders to [facility] at [phone].

## 2024-08-18 ENCOUNTER — Inpatient Hospital Stay: Attending: Hematology and Oncology | Admitting: Hematology and Oncology

## 2024-08-18 VITALS — BP 130/70 | HR 79 | Temp 98.2°F | Resp 16 | Wt 175.5 lb

## 2024-08-18 DIAGNOSIS — Z171 Estrogen receptor negative status [ER-]: Secondary | ICD-10-CM | POA: Diagnosis not present

## 2024-08-18 DIAGNOSIS — Z853 Personal history of malignant neoplasm of breast: Secondary | ICD-10-CM | POA: Insufficient documentation

## 2024-08-18 DIAGNOSIS — Z08 Encounter for follow-up examination after completed treatment for malignant neoplasm: Secondary | ICD-10-CM | POA: Insufficient documentation

## 2024-08-18 DIAGNOSIS — Z9221 Personal history of antineoplastic chemotherapy: Secondary | ICD-10-CM | POA: Insufficient documentation

## 2024-08-18 DIAGNOSIS — Z923 Personal history of irradiation: Secondary | ICD-10-CM | POA: Insufficient documentation

## 2024-08-18 DIAGNOSIS — C50411 Malignant neoplasm of upper-outer quadrant of right female breast: Secondary | ICD-10-CM

## 2024-08-18 NOTE — Assessment & Plan Note (Signed)
 11/25/2017: Right breast UOQ T2N2 stage IIIc grade 3 IDC triple negative Ki-67 80% 01/04/2018 neoadjuvant Taxol  carbo x9 followed by Adriamycin  Cytoxan  x4 completed 06/14/2018 06/28/2018: Right lumpectomy: Residual 2.2 cm grade 3 IDC with negative margins 0/3 lymph nodes negative December 2019: Completed adjuvant radiation at [facility] 18: Was randomized to observation.  Pembrolizumab  started 11/15/2018-05/24/2019   Breast cancer surveillance: Breast exam 08/18/2024: Benign Mammogram at [facility], benign breast density category C   She does not like to get mammograms because a cause pain.  However she will call and make an appointment for next year.  Return to clinic in 1 year for follow-up

## 2024-08-18 NOTE — Progress Notes (Signed)
 Mammograms on May this so health then we will okay  Patient Care Team: System, Provider Not In as PCP - General Kennyth Hummer, MD as Radiation Oncologist (Radiation Oncology) Shari Sieving, MD as Consulting Physician (Orthopedic Surgery) Crawford Morna Pickle, NP as Nurse Practitioner (Hematology and Oncology)  DIAGNOSIS:  Encounter Diagnosis  Name Primary?   Malignant neoplasm of upper-outer quadrant of right breast in female, estrogen receptor negative (HCC) Yes    SUMMARY OF ONCOLOGIC HISTORY: Oncology History  Malignant neoplasm of upper-outer quadrant of right breast in female, estrogen receptor negative ([facility])  11/25/2017 Initial Diagnosis   78 y.o. Veronica Price, [name] woman status post right breast upper outer quadrant biopsy 11/25/2017 for a clinical T2 N2, stage IIIc invasive ductal carcinoma, grade 3, triple negative, with an MIB-1 of 80%.   (1) staging studies:             (a) chest CT 12/10/2017 scan showed no visceral metastatic disease             (b) bone scan 12/10/2017 showed uptake at T11, with sclerosis.             (c) CA-27-29 on 12/05/2017 was 19.0             (d) spinal MRI in [place] reportedly showed only arthrtis at T11     01/04/2018 - 06/14/2018 Neo-Adjuvant Chemotherapy   (2) neoadjuvant chemotherapy consisting of carboplatin / paclitaxel  x 12 (received 9) started 01/04/2018, discontinued 03/15/2018, followed by cyclophosphamide  and doxorubicin  given every 3 weeks x 4 starting 04/05/2018, completed 06/14/2018             (a) carboplatin /paclitaxel  changed to carboplatin /gemcitabine  after 8 cycles due to neuroapthy             (b) only able to tolerate 1 cycle of Gemcitabine /Carboplatin  due to rash             (a) echocardiogram on 04/02/18 demonstrates a LVEF of 60-65%   06/28/2018 Surgery   status post right lumpectomy and sentinel lymph node sampling on showing a residual 2.2 cm, grade 3 invasive ductal carcinoma, with negative margins and 0 of 3 sentinel  lymph nodes removed involved (ypT2 ypN0)   08/16/2018 - 09/08/2018 Radiation Therapy   Treatment site Treatment Technique/Modality Energy Dose per fraction Total number of fractions Total dose Start date End date  Right Breast 3D CRT 6 MV 266 cGy 16 4256 cGy 08/16/2018   09/08/2018  Lumpectomy boost Enface Electrons 15 MeV 200 cGy 5 1000 cGy    11/15/2018 Miscellaneous   pembrolizumab  started (not as part of study) 11/15/2018, to be repeated every 21 days for 1 year   08/09/2019 Imaging   additional studies             (a) chest CT scan 08/09/2019 showed no evidence of metastases.  A left renal cyst was noted             (b) abdominal MRI 08/23/2019 showed a benign Bosniak 2 renal cyst and additional Bosniak 1 renal cysts.  There was no evidence of metastatic disease     CHIEF COMPLIANT: Surveillance of breast cancer  HISTORY OF PRESENT ILLNESS:   History of Present Illness Veronica Price is a 78 year old female who presents for a follow-up visit and for surveillance of breast cancer.  She was diagnosed with breast cancer nearly seven years ago and underwent chemotherapy. She has not completed her scheduled mammogram, citing discomfort with the procedure.  She is  planning to call and make that appointment.  She denies any lumps or nodules.  Tenderness in the right breast.     ALLERGIES:  is allergic to prednisone .  MEDICATIONS:  Current Outpatient Medications  Medication Sig Dispense Refill   cyanocobalamin  (,VITAMIN B-12,) 1000 MCG/ML injection Inject into the muscle.     ergocalciferol  (VITAMIN D2) 1.25 MG (50000 UT) capsule Take 1 capsule (50,000 Units total) by mouth once a week. 12 capsule 3   levothyroxine  (SYNTHROID ) 50 MCG tablet TAKE 1 TABLET BY MOUTH EVERY DAY BEFORE BREAKFAST 90 tablet 1   No current facility-administered medications for this visit.    PHYSICAL EXAMINATION: ECOG PERFORMANCE STATUS: 1 - Symptomatic but completely ambulatory  Vitals:   08/18/24  1021  BP: 130/70  Pulse: 79  Resp: 16  Temp: 98.2 F (36.8 C)  SpO2: 98%   Filed Weights   08/18/24 1021  Weight: 175 lb 8 oz (79.6 kg)    Physical Exam BREAST: Breasts with scar tissue, otherwise normal.  (exam performed in the presence of a chaperone)  LABORATORY DATA:  I have reviewed the data as listed    Latest Ref Rng & Units 08/18/2022    9:14 AM 03/18/2022    8:53 AM 08/20/2021    9:50 AM  CMP  Glucose 70 - 99 mg/dL 80  98  92   BUN 8 - 23 mg/dL 15  17  17    Creatinine 0.44 - 1.00 mg/dL 9.03  9.05  9.13   Sodium 135 - 145 mmol/L 140  138  140   Potassium 3.5 - 5.1 mmol/L 4.0  3.7  4.1   Chloride 98 - 111 mmol/L 105  106  106   CO2 22 - 32 mmol/L 31  26  26    Calcium 8.9 - 10.3 mg/dL 9.5  9.5  9.1   Total Protein 6.5 - 8.1 g/dL 8.1  7.9  7.8   Total Bilirubin 0.3 - 1.2 mg/dL 0.4  0.4  0.5   Alkaline Phos 38 - 126 U/L 58  53  66   AST 15 - 41 U/L 15  14  21    ALT 0 - 44 U/L 13  19  14      Lab Results  Component Value Date   WBC 4.4 08/18/2022   HGB 13.7 08/18/2022   HCT 40.5 08/18/2022   MCV 98.3 08/18/2022   PLT 223 08/18/2022   NEUTROABS 2.7 08/18/2022    ASSESSMENT & PLAN:  Malignant neoplasm of upper-outer quadrant of right breast in female, estrogen receptor negative (HCC) 11/25/2017: Right breast UOQ T2N2 stage IIIc grade 3 IDC triple negative Ki-67 80% 01/04/2018 neoadjuvant Taxol  carbo x9 followed by Adriamycin  Cytoxan  x4 completed 06/14/2018 06/28/2018: Right lumpectomy: Residual 2.2 cm grade 3 IDC with negative margins 0/3 lymph nodes negative December 2019: Completed adjuvant radiation at [name] 18: Was randomized to observation.  Pembrolizumab  started 11/15/2018-05/24/2019   Breast cancer surveillance: Breast exam 08/18/2024: Benign Mammogram at [facility], benign breast density category C   She does not like to get mammograms because a cause pain.  However she will call and make an appointment for next year.  Return to clinic in 1 year  for follow-up     No orders of the defined types were placed in this encounter.  The patient has a good understanding of the overall plan. she agrees with it. she will call with any problems that may develop before the next visit here.  I  personally spent a total of 30 minutes in the care of the patient today including preparing to see the patient, getting/reviewing separately obtained history, performing a medically appropriate exam/evaluation, counseling and educating, placing orders, referring and communicating with other health care professionals, documenting clinical information in the EHR, independently interpreting results, communicating results, and coordinating care.   Viinay K Jasara Corrigan, MD 08/18/24

## 2024-08-26 ENCOUNTER — Other Ambulatory Visit: Payer: Self-pay | Admitting: *Deleted

## 2024-08-26 DIAGNOSIS — C50411 Malignant neoplasm of upper-outer quadrant of right female breast: Secondary | ICD-10-CM

## 2024-08-26 NOTE — Progress Notes (Signed)
 Per pt request RN faxed mammogram order to [facility] [phone]).

## 2024-08-29 ENCOUNTER — Telehealth: Payer: Self-pay

## 2024-08-29 ENCOUNTER — Other Ambulatory Visit: Payer: Self-pay

## 2024-08-29 DIAGNOSIS — Z1231 Encounter for screening mammogram for malignant neoplasm of breast: Secondary | ICD-10-CM

## 2024-08-29 NOTE — Telephone Encounter (Signed)
 Received confirmation of successful fax transmission of signed updated imaging orders.

## 2025-08-17 ENCOUNTER — Inpatient Hospital Stay: Admitting: Hematology and Oncology
